# Patient Record
Sex: Female | Born: 1935 | Race: White | Hispanic: No | Smoking: Never smoker
Health system: Southern US, Community
[De-identification: ages and names within clinical notes are randomized; demographics above are authoritative.]

## PROBLEM LIST (undated history)

## (undated) DIAGNOSIS — F419 Anxiety disorder, unspecified: Secondary | ICD-10-CM

## (undated) DIAGNOSIS — E079 Disorder of thyroid, unspecified: Secondary | ICD-10-CM

## (undated) DIAGNOSIS — I6529 Occlusion and stenosis of unspecified carotid artery: Secondary | ICD-10-CM

## (undated) DIAGNOSIS — M545 Low back pain, unspecified: Secondary | ICD-10-CM

## (undated) DIAGNOSIS — I491 Atrial premature depolarization: Secondary | ICD-10-CM

## (undated) DIAGNOSIS — F329 Major depressive disorder, single episode, unspecified: Secondary | ICD-10-CM

## (undated) DIAGNOSIS — I10 Essential (primary) hypertension: Secondary | ICD-10-CM

## (undated) DIAGNOSIS — K219 Gastro-esophageal reflux disease without esophagitis: Secondary | ICD-10-CM

## (undated) DIAGNOSIS — R350 Frequency of micturition: Secondary | ICD-10-CM

## (undated) DIAGNOSIS — M810 Age-related osteoporosis without current pathological fracture: Secondary | ICD-10-CM

## (undated) DIAGNOSIS — E785 Hyperlipidemia, unspecified: Secondary | ICD-10-CM

## (undated) DIAGNOSIS — R001 Bradycardia, unspecified: Secondary | ICD-10-CM

## (undated) DIAGNOSIS — F32A Depression, unspecified: Secondary | ICD-10-CM

## (undated) HISTORY — DX: Age-related osteoporosis without current pathological fracture: M81.0

## (undated) HISTORY — DX: Bradycardia, unspecified: R00.1

## (undated) HISTORY — DX: Gastro-esophageal reflux disease without esophagitis: K21.9

## (undated) HISTORY — DX: Major depressive disorder, single episode, unspecified: F32.9

## (undated) HISTORY — DX: Essential (primary) hypertension: I10

## (undated) HISTORY — PX: OTHER SURGICAL HISTORY: SHX169

## (undated) HISTORY — DX: Hyperlipidemia, unspecified: E78.5

## (undated) HISTORY — DX: Low back pain: M54.5

## (undated) HISTORY — DX: Depression, unspecified: F32.A

## (undated) HISTORY — DX: Anxiety disorder, unspecified: F41.9

## (undated) HISTORY — DX: Occlusion and stenosis of unspecified carotid artery: I65.29

## (undated) HISTORY — DX: Low back pain, unspecified: M54.50

## (undated) HISTORY — DX: Frequency of micturition: R35.0

## (undated) HISTORY — PX: WRIST SURGERY: SHX841

## (undated) HISTORY — DX: Disorder of thyroid, unspecified: E07.9

## (undated) HISTORY — DX: Atrial premature depolarization: I49.1

## (undated) HISTORY — PX: CARPAL TUNNEL RELEASE: SHX101

---

## 2006-01-19 ENCOUNTER — Ambulatory Visit: Payer: Self-pay | Admitting: Internal Medicine

## 2006-12-12 LAB — HM PAP SMEAR

## 2008-02-25 ENCOUNTER — Ambulatory Visit: Payer: Self-pay | Admitting: Family Medicine

## 2008-09-15 LAB — HM MAMMOGRAPHY

## 2008-10-30 ENCOUNTER — Ambulatory Visit: Payer: Self-pay | Admitting: Internal Medicine

## 2010-01-27 ENCOUNTER — Ambulatory Visit: Payer: Self-pay | Admitting: Vascular Surgery

## 2010-02-02 ENCOUNTER — Ambulatory Visit: Payer: Self-pay | Admitting: Vascular Surgery

## 2010-03-02 ENCOUNTER — Inpatient Hospital Stay: Payer: Self-pay | Admitting: Vascular Surgery

## 2014-02-03 DIAGNOSIS — I38 Endocarditis, valve unspecified: Secondary | ICD-10-CM | POA: Insufficient documentation

## 2014-02-03 DIAGNOSIS — I6529 Occlusion and stenosis of unspecified carotid artery: Secondary | ICD-10-CM | POA: Insufficient documentation

## 2014-05-20 ENCOUNTER — Ambulatory Visit: Payer: Self-pay | Admitting: Podiatry

## 2015-06-15 ENCOUNTER — Ambulatory Visit: Payer: PPO

## 2015-06-15 DIAGNOSIS — Z23 Encounter for immunization: Secondary | ICD-10-CM

## 2015-07-02 ENCOUNTER — Other Ambulatory Visit: Payer: Self-pay | Admitting: Unknown Physician Specialty

## 2015-07-05 ENCOUNTER — Other Ambulatory Visit: Payer: Self-pay

## 2015-07-05 MED ORDER — LOSARTAN POTASSIUM 100 MG PO TABS: 100.0000 mg | ORAL_TABLET | Freq: Every day | ORAL | Status: DC

## 2015-07-05 NOTE — Telephone Encounter (Signed)
Patient was last seen 12/30/14 and has appointment scheduled for 07/14/15. Practice partner number is [phone] and pharmacy is [facility] on [address].

## 2015-07-12 DIAGNOSIS — E039 Hypothyroidism, unspecified: Secondary | ICD-10-CM

## 2015-07-12 DIAGNOSIS — F329 Major depressive disorder, single episode, unspecified: Secondary | ICD-10-CM

## 2015-07-12 DIAGNOSIS — R001 Bradycardia, unspecified: Secondary | ICD-10-CM

## 2015-07-12 DIAGNOSIS — I6529 Occlusion and stenosis of unspecified carotid artery: Secondary | ICD-10-CM

## 2015-07-12 DIAGNOSIS — K219 Gastro-esophageal reflux disease without esophagitis: Secondary | ICD-10-CM

## 2015-07-12 DIAGNOSIS — F32A Depression, unspecified: Secondary | ICD-10-CM

## 2015-07-12 DIAGNOSIS — E785 Hyperlipidemia, unspecified: Secondary | ICD-10-CM | POA: Insufficient documentation

## 2015-07-12 DIAGNOSIS — M810 Age-related osteoporosis without current pathological fracture: Secondary | ICD-10-CM

## 2015-07-12 DIAGNOSIS — F325 Major depressive disorder, single episode, in full remission: Secondary | ICD-10-CM | POA: Insufficient documentation

## 2015-07-12 DIAGNOSIS — F339 Major depressive disorder, recurrent, unspecified: Secondary | ICD-10-CM | POA: Insufficient documentation

## 2015-07-12 DIAGNOSIS — F419 Anxiety disorder, unspecified: Secondary | ICD-10-CM

## 2015-07-12 DIAGNOSIS — R350 Frequency of micturition: Secondary | ICD-10-CM

## 2015-07-12 DIAGNOSIS — L659 Nonscarring hair loss, unspecified: Secondary | ICD-10-CM

## 2015-07-12 DIAGNOSIS — I491 Atrial premature depolarization: Secondary | ICD-10-CM

## 2015-07-12 DIAGNOSIS — I10 Essential (primary) hypertension: Secondary | ICD-10-CM | POA: Insufficient documentation

## 2015-07-14 ENCOUNTER — Ambulatory Visit (INDEPENDENT_AMBULATORY_CARE_PROVIDER_SITE_OTHER): Payer: PPO | Admitting: Unknown Physician Specialty

## 2015-07-14 ENCOUNTER — Encounter: Payer: Self-pay | Admitting: Unknown Physician Specialty

## 2015-07-14 VITALS — BP 149/84 | HR 90 | Temp 97.8°F | Ht 66.6 in | Wt 203.2 lb

## 2015-07-14 DIAGNOSIS — R350 Frequency of micturition: Secondary | ICD-10-CM | POA: Diagnosis not present

## 2015-07-14 DIAGNOSIS — E039 Hypothyroidism, unspecified: Secondary | ICD-10-CM | POA: Diagnosis not present

## 2015-07-14 DIAGNOSIS — I38 Endocarditis, valve unspecified: Secondary | ICD-10-CM

## 2015-07-14 DIAGNOSIS — I10 Essential (primary) hypertension: Secondary | ICD-10-CM

## 2015-07-14 DIAGNOSIS — Z0000 Encounter for general adult medical examination without abnormal findings: Secondary | ICD-10-CM

## 2015-07-14 LAB — MICROALBUMIN, URINE WAIVED
CREATININE, URINE WAIVED: 10 mg/dL (ref 10–300)
MICROALB, UR WAIVED: 10 mg/L (ref 0–19)

## 2015-07-14 LAB — MICROSCOPIC EXAMINATION

## 2015-07-14 NOTE — Assessment & Plan Note (Signed)
Stable, continue present medications.   

## 2015-07-14 NOTE — Progress Notes (Signed)
BP 149/84 mmHg  Pulse 90  Temp(Src) 97.8 F (36.6 C)  Ht 5' 6.6" (1.692 m)  Wt 203 lb 3.2 oz (92.171 kg)  BMI 32.20 kg/m2  SpO2 95%  LMP  (LMP Unknown)   Subjective:    Patient ID: Mckenzie Gutierrez, female    DOB: April 01, 1922, 81 y.o.   MRN: [id number]  HPI: Mckenzie Gutierrez is a 81 y.o. female  Chief Complaint  Patient presents with  . Medicare Wellness   Hypertension Using medications without difficulty Average home BPs   No problems or lightheadedness No chest pain with exertion or shortness of breath No Edema  Hypothyroid Doing well with improved energy since she started taking Biotin.   Depression Doing well Depression screen PHQ 2/9 07/14/2015  Decreased Interest 0  Down, Depressed, Hopeless 0  PHQ - 2 Score 0   Functional Status Survey: Is the patient deaf or have difficulty hearing?: Yes (pt has hearing aids) Does the patient have difficulty seeing, even when wearing glasses/contacts?: No Does the patient have difficulty concentrating, remembering, or making decisions?: Yes Does the patient have difficulty walking or climbing stairs?: Yes (pt states legs get weak sometimes) Does the patient have difficulty dressing or bathing?: No Does the patient have difficulty doing errands alone such as visiting a doctor's office or shopping?: No  Fall Risk  07/14/2015  Falls in the past year? No   Relevant past medical, surgical, family and social history reviewed and updated as indicated. Interim medical history since our last visit reviewed. Allergies and medications reviewed and updated.  See functional status, depression screen, and fall's risk assessment  under the appropriate section.    Pt is able to perform complex mental tasks, recognize clock face, recognize time and do a 3 item recall.     Review of Systems  Constitutional: Negative.   HENT: Negative.   Eyes: Negative.   Respiratory: Negative.   Cardiovascular: Negative.   Gastrointestinal:  Negative.   Endocrine: Negative.   Genitourinary:       Occasional urgency  Musculoskeletal: Negative.   Skin: Negative.   Allergic/Immunologic: Negative.   Neurological: Negative.   Hematological: Negative.   Psychiatric/Behavioral: Negative.     Per HPI unless specifically indicated above     Objective:    BP 149/84 mmHg  Pulse 90  Temp(Src) 97.8 F (36.6 C)  Ht 5' 6.6" (1.692 m)  Wt 203 lb 3.2 oz (92.171 kg)  BMI 32.20 kg/m2  SpO2 95%  LMP  (LMP Unknown)  Wt Readings from Last 3 Encounters:  07/14/15 203 lb 3.2 oz (92.171 kg)  12/30/14 200 lb (90.719 kg)    Physical Exam  Constitutional: She is oriented to person, place, and time. She appears well-developed and well-nourished. No distress.  HENT:  Head: Normocephalic and atraumatic.  Eyes: Conjunctivae and lids are normal. Right eye exhibits no discharge. Left eye exhibits no discharge. No scleral icterus.  Cardiovascular: Normal rate and regular rhythm.   Murmur heard. Pulmonary/Chest: Effort normal and breath sounds normal. No respiratory distress.  Abdominal: Normal appearance. There is no splenomegaly or hepatomegaly.  Musculoskeletal: Normal range of motion.  Neurological: She is alert and oriented to person, place, and time.  Skin: Skin is intact. No rash noted. No pallor.  Psychiatric: She has a normal mood and affect. Her behavior is normal. Judgment and thought content normal.       Assessment & Plan:   Problem List Items Addressed This Visit  Unprioritized   Heart valve disease    Discussed referring back to Dr. Nehemiah Massed  Pt refusing as she would not want surgery      Hypothyroid    Check TSH      Relevant Orders   TSH   Urinary frequency   Relevant Orders   UA/M w/rflx Culture, Routine   Hypertension - Primary    Stable, continue present medications.       Relevant Orders   Microalbumin, Urine Waived   Comprehensive metabolic panel   Lipid Panel w/o Chol/HDL Ratio    Other  Visit Diagnoses    Annual physical exam            Follow up plan: Return in about 6 months (around 01/11/2016).

## 2015-07-14 NOTE — Assessment & Plan Note (Signed)
Check TSH 

## 2015-07-14 NOTE — Assessment & Plan Note (Addendum)
Discussed referring back to Dr. Nehemiah Massed  Pt refusing as she would not want surgery

## 2015-07-15 ENCOUNTER — Encounter: Payer: Self-pay | Admitting: Unknown Physician Specialty

## 2015-07-15 LAB — COMPREHENSIVE METABOLIC PANEL
A/G RATIO: 2 (ref 1.1–2.5)
ALBUMIN: 4.7 g/dL — AB (ref 3.2–4.6)
ALK PHOS: 67 IU/L (ref 39–117)
ALT: 14 IU/L (ref 0–32)
AST: 25 IU/L (ref 0–40)
BILIRUBIN TOTAL: 1 mg/dL (ref 0.0–1.2)
BUN / CREAT RATIO: 13 (ref 11–26)
BUN: 11 mg/dL (ref 10–36)
CHLORIDE: 101 mmol/L (ref 97–106)
CO2: 25 mmol/L (ref 18–29)
CREATININE: 0.86 mg/dL (ref 0.57–1.00)
Calcium: 9.6 mg/dL (ref 8.7–10.3)
GFR calc Af Amer: 68 mL/min/{1.73_m2} (ref >59)
GFR calc non Af Amer: 59 mL/min/{1.73_m2} — ABNORMAL LOW (ref >59)
Globulin, Total: 2.3 g/dL (ref 1.5–4.5)
Glucose: 109 mg/dL — ABNORMAL HIGH (ref 65–99)
Potassium: 3.9 mmol/L (ref 3.5–5.2)
Sodium: 142 mmol/L (ref 136–144)
Total Protein: 7 g/dL (ref 6.0–8.5)

## 2015-07-15 LAB — LIPID PANEL W/O CHOL/HDL RATIO
CHOLESTEROL TOTAL: 196 mg/dL (ref 100–199)
HDL: 49 mg/dL (ref >39)
LDL Calculated: 119 mg/dL — ABNORMAL HIGH (ref 0–99)
TRIGLYCERIDES: 141 mg/dL (ref 0–149)
VLDL Cholesterol Cal: 28 mg/dL (ref 5–40)

## 2015-07-15 LAB — TSH: TSH: 3.32 u[IU]/mL (ref 0.450–4.500)

## 2015-07-16 ENCOUNTER — Other Ambulatory Visit: Payer: Self-pay | Admitting: Unknown Physician Specialty

## 2015-07-16 LAB — URINE CULTURE, REFLEX

## 2015-07-16 MED ORDER — CIPROFLOXACIN HCL 250 MG PO TABS: 250.0000 mg | ORAL_TABLET | Freq: Two times a day (BID) | ORAL | Status: DC

## 2015-07-16 NOTE — Progress Notes (Signed)
Called and let patient know about results and medication.

## 2015-07-18 LAB — UA/M W/RFLX CULTURE, ROUTINE

## 2015-07-19 ENCOUNTER — Other Ambulatory Visit: Payer: Self-pay | Admitting: Unknown Physician Specialty

## 2015-07-19 MED ORDER — DOXYCYCLINE HYCLATE 100 MG PO TABS: 100.0000 mg | ORAL_TABLET | Freq: Two times a day (BID) | ORAL | Status: DC

## 2015-08-17 ENCOUNTER — Ambulatory Visit (INDEPENDENT_AMBULATORY_CARE_PROVIDER_SITE_OTHER): Payer: PPO | Admitting: Unknown Physician Specialty

## 2015-08-17 ENCOUNTER — Encounter: Payer: Self-pay | Admitting: Unknown Physician Specialty

## 2015-08-17 VITALS — BP 150/85 | HR 69 | Temp 97.9°F | Wt 204.4 lb

## 2015-08-17 DIAGNOSIS — B356 Tinea cruris: Secondary | ICD-10-CM

## 2015-08-17 MED ORDER — NYSTATIN 100000 UNIT/GM EX OINT: 1.0000 "application " | TOPICAL_OINTMENT | Freq: Two times a day (BID) | CUTANEOUS | Status: DC

## 2015-08-17 MED ORDER — FLUCONAZOLE 100 MG PO TABS: 100.0000 mg | ORAL_TABLET | Freq: Every day | ORAL | Status: DC

## 2015-08-17 NOTE — Progress Notes (Signed)
BP 150/85 mmHg  Pulse 69  Temp(Src) 97.9 F (36.6 C)  Wt 204 lb 6.4 oz (92.715 kg)  SpO2 95%  LMP  (LMP Unknown)   Subjective:    Patient ID: Mckenzie Gutierrez, female    DOB: [birth year], 81 y.o.   MRN: [id number]  HPI: Mckenzie Gutierrez is a 81 y.o. female  Chief Complaint  Patient presents with  . Rash    pt states she has rash on the top of her thigh that came up 2 to 3 weeks ago   Rash itches and is painful.  "It won't get well" and tried to pad it with toilet tissue which made it worse.  Relevant past medical, surgical, family and social history reviewed and updated as indicated. Interim medical history since our last visit reviewed. Allergies and medications reviewed and updated.  Review of Systems  Per HPI unless specifically indicated above     Objective:    BP 150/85 mmHg  Pulse 69  Temp(Src) 97.9 F (36.6 C)  Wt 204 lb 6.4 oz (92.715 kg)  SpO2 95%  LMP  (LMP Unknown)  Wt Readings from Last 3 Encounters:  08/17/15 204 lb 6.4 oz (92.715 kg)  07/14/15 203 lb 3.2 oz (92.171 kg)  12/30/14 200 lb (90.719 kg)    Physical Exam  Constitutional: She is oriented to person, place, and time. She appears well-developed and well-nourished. No distress.  HENT:  Head: Normocephalic and atraumatic.  Eyes: Conjunctivae and lids are normal. Right eye exhibits no discharge. Left eye exhibits no discharge. No scleral icterus.  Cardiovascular: Normal rate.   Pulmonary/Chest: Effort normal.  Abdominal: Normal appearance. There is no splenomegaly or hepatomegaly.  Musculoskeletal: Normal range of motion.  Neurological: She is alert and oriented to person, place, and time.  Skin: Skin is intact. No rash noted. No pallor.  Red and macerated right groin area.    Psychiatric: She has a normal mood and affect. Her behavior is normal. Judgment and thought content normal.    Results for orders placed or performed in visit on 07/14/15  Microscopic Examination  Result Value Ref  Range   WBC, UA 6-10 (A) 0 -  5 /hpf   RBC, UA 3-10 (A) 0 -  2 /hpf   Epithelial Cells (non renal) 0-10 0 - 10 /hpf   Bacteria, UA Moderate (A) None seen/Few  Microalbumin, Urine Waived  Result Value Ref Range   Microalb, Ur Waived 10 0 - 19 mg/L   Creatinine, Urine Waived 10 10 - 300 mg/dL   Microalb/Creat Ratio <30 <30 mg/g  Comprehensive metabolic panel  Result Value Ref Range   Glucose 109 (H) 65 - 99 mg/dL   BUN 11 10 - 36 mg/dL   Creatinine, Ser 0.86 0.57 - 1.00 mg/dL   GFR calc non Af Amer 59 (L) >59 mL/min/1.73   GFR calc Af Amer 68 >59 mL/min/1.73   BUN/Creatinine Ratio 13 11 - 26   Sodium 142 136 - 144 mmol/L   Potassium 3.9 3.5 - 5.2 mmol/L   Chloride 101 97 - 106 mmol/L   CO2 25 18 - 29 mmol/L   Calcium 9.6 8.7 - 10.3 mg/dL   Total Protein 7.0 6.0 - 8.5 g/dL   Albumin 4.7 (H) 3.2 - 4.6 g/dL   Globulin, Total 2.3 1.5 - 4.5 g/dL   Albumin/Globulin Ratio 2.0 1.1 - 2.5   Bilirubin Total 1.0 0.0 - 1.2 mg/dL   Alkaline Phosphatase 67 39 - 117 IU/L  AST 25 0 - 40 IU/L   ALT 14 0 - 32 IU/L  TSH  Result Value Ref Range   TSH 3.320 0.450 - 4.500 uIU/mL  Lipid Panel w/o Chol/HDL Ratio  Result Value Ref Range   Cholesterol, Total 196 100 - 199 mg/dL   Triglycerides 141 0 - 149 mg/dL   HDL 49 >39 mg/dL   VLDL Cholesterol Cal 28 5 - 40 mg/dL   LDL Calculated 119 (H) 0 - 99 mg/dL  UA/M w/rflx Culture, Routine  Result Value Ref Range   Urine Culture, Routine Final report (A)    Urine Culture result 1 Comment (A)    ANTIMICROBIAL SUSCEPTIBILITY Comment   Urine Culture, Routine  Result Value Ref Range   Urine Culture result 1 Gram negative rods (A)       Assessment & Plan:   Problem List Items Addressed This Visit    None    Visit Diagnoses    Tinea cruris    -  Primary    Severe case.  Will rx with both diflucan and cream    Relevant Medications    fluconazole (DIFLUCAN) 100 MG tablet    nystatin ointment (MYCOSTATIN)        Follow up plan: Return if  symptoms worsen or fail to improve.

## 2015-09-06 DIAGNOSIS — M9905 Segmental and somatic dysfunction of pelvic region: Secondary | ICD-10-CM | POA: Diagnosis not present

## 2015-09-06 DIAGNOSIS — M5431 Sciatica, right side: Secondary | ICD-10-CM | POA: Diagnosis not present

## 2015-09-06 DIAGNOSIS — M9903 Segmental and somatic dysfunction of lumbar region: Secondary | ICD-10-CM | POA: Diagnosis not present

## 2015-09-06 DIAGNOSIS — M5136 Other intervertebral disc degeneration, lumbar region: Secondary | ICD-10-CM | POA: Diagnosis not present

## 2015-09-08 DIAGNOSIS — M5136 Other intervertebral disc degeneration, lumbar region: Secondary | ICD-10-CM | POA: Diagnosis not present

## 2015-09-08 DIAGNOSIS — M9905 Segmental and somatic dysfunction of pelvic region: Secondary | ICD-10-CM | POA: Diagnosis not present

## 2015-09-08 DIAGNOSIS — M5431 Sciatica, right side: Secondary | ICD-10-CM | POA: Diagnosis not present

## 2015-09-08 DIAGNOSIS — M9903 Segmental and somatic dysfunction of lumbar region: Secondary | ICD-10-CM | POA: Diagnosis not present

## 2015-09-10 DIAGNOSIS — M9905 Segmental and somatic dysfunction of pelvic region: Secondary | ICD-10-CM | POA: Diagnosis not present

## 2015-09-10 DIAGNOSIS — M9903 Segmental and somatic dysfunction of lumbar region: Secondary | ICD-10-CM | POA: Diagnosis not present

## 2015-09-10 DIAGNOSIS — M5431 Sciatica, right side: Secondary | ICD-10-CM | POA: Diagnosis not present

## 2015-09-10 DIAGNOSIS — M5136 Other intervertebral disc degeneration, lumbar region: Secondary | ICD-10-CM | POA: Diagnosis not present

## 2015-09-15 DIAGNOSIS — M5431 Sciatica, right side: Secondary | ICD-10-CM | POA: Diagnosis not present

## 2015-09-15 DIAGNOSIS — M9903 Segmental and somatic dysfunction of lumbar region: Secondary | ICD-10-CM | POA: Diagnosis not present

## 2015-09-15 DIAGNOSIS — M9905 Segmental and somatic dysfunction of pelvic region: Secondary | ICD-10-CM | POA: Diagnosis not present

## 2015-09-15 DIAGNOSIS — M5136 Other intervertebral disc degeneration, lumbar region: Secondary | ICD-10-CM | POA: Diagnosis not present

## 2015-09-16 DIAGNOSIS — M5431 Sciatica, right side: Secondary | ICD-10-CM | POA: Diagnosis not present

## 2015-09-16 DIAGNOSIS — M9903 Segmental and somatic dysfunction of lumbar region: Secondary | ICD-10-CM | POA: Diagnosis not present

## 2015-09-16 DIAGNOSIS — M9905 Segmental and somatic dysfunction of pelvic region: Secondary | ICD-10-CM | POA: Diagnosis not present

## 2015-09-16 DIAGNOSIS — M5136 Other intervertebral disc degeneration, lumbar region: Secondary | ICD-10-CM | POA: Diagnosis not present

## 2015-09-17 DIAGNOSIS — M9905 Segmental and somatic dysfunction of pelvic region: Secondary | ICD-10-CM | POA: Diagnosis not present

## 2015-09-17 DIAGNOSIS — M5431 Sciatica, right side: Secondary | ICD-10-CM | POA: Diagnosis not present

## 2015-09-17 DIAGNOSIS — M9903 Segmental and somatic dysfunction of lumbar region: Secondary | ICD-10-CM | POA: Diagnosis not present

## 2015-09-17 DIAGNOSIS — M5136 Other intervertebral disc degeneration, lumbar region: Secondary | ICD-10-CM | POA: Diagnosis not present

## 2015-09-20 DIAGNOSIS — M9905 Segmental and somatic dysfunction of pelvic region: Secondary | ICD-10-CM | POA: Diagnosis not present

## 2015-09-20 DIAGNOSIS — M5431 Sciatica, right side: Secondary | ICD-10-CM | POA: Diagnosis not present

## 2015-09-20 DIAGNOSIS — M5136 Other intervertebral disc degeneration, lumbar region: Secondary | ICD-10-CM | POA: Diagnosis not present

## 2015-09-20 DIAGNOSIS — M9903 Segmental and somatic dysfunction of lumbar region: Secondary | ICD-10-CM | POA: Diagnosis not present

## 2015-09-22 DIAGNOSIS — M5136 Other intervertebral disc degeneration, lumbar region: Secondary | ICD-10-CM | POA: Diagnosis not present

## 2015-09-22 DIAGNOSIS — M9903 Segmental and somatic dysfunction of lumbar region: Secondary | ICD-10-CM | POA: Diagnosis not present

## 2015-09-22 DIAGNOSIS — M5431 Sciatica, right side: Secondary | ICD-10-CM | POA: Diagnosis not present

## 2015-09-22 DIAGNOSIS — M9905 Segmental and somatic dysfunction of pelvic region: Secondary | ICD-10-CM | POA: Diagnosis not present

## 2015-09-24 DIAGNOSIS — M9905 Segmental and somatic dysfunction of pelvic region: Secondary | ICD-10-CM | POA: Diagnosis not present

## 2015-09-24 DIAGNOSIS — M5136 Other intervertebral disc degeneration, lumbar region: Secondary | ICD-10-CM | POA: Diagnosis not present

## 2015-09-24 DIAGNOSIS — M5431 Sciatica, right side: Secondary | ICD-10-CM | POA: Diagnosis not present

## 2015-09-24 DIAGNOSIS — M9903 Segmental and somatic dysfunction of lumbar region: Secondary | ICD-10-CM | POA: Diagnosis not present

## 2015-09-27 DIAGNOSIS — M9903 Segmental and somatic dysfunction of lumbar region: Secondary | ICD-10-CM | POA: Diagnosis not present

## 2015-09-27 DIAGNOSIS — M5431 Sciatica, right side: Secondary | ICD-10-CM | POA: Diagnosis not present

## 2015-09-27 DIAGNOSIS — M5136 Other intervertebral disc degeneration, lumbar region: Secondary | ICD-10-CM | POA: Diagnosis not present

## 2015-09-27 DIAGNOSIS — M9905 Segmental and somatic dysfunction of pelvic region: Secondary | ICD-10-CM | POA: Diagnosis not present

## 2015-09-29 DIAGNOSIS — M9903 Segmental and somatic dysfunction of lumbar region: Secondary | ICD-10-CM | POA: Diagnosis not present

## 2015-09-29 DIAGNOSIS — M5431 Sciatica, right side: Secondary | ICD-10-CM | POA: Diagnosis not present

## 2015-09-29 DIAGNOSIS — M5136 Other intervertebral disc degeneration, lumbar region: Secondary | ICD-10-CM | POA: Diagnosis not present

## 2015-09-29 DIAGNOSIS — M9905 Segmental and somatic dysfunction of pelvic region: Secondary | ICD-10-CM | POA: Diagnosis not present

## 2015-10-01 DIAGNOSIS — M5431 Sciatica, right side: Secondary | ICD-10-CM | POA: Diagnosis not present

## 2015-10-01 DIAGNOSIS — M9903 Segmental and somatic dysfunction of lumbar region: Secondary | ICD-10-CM | POA: Diagnosis not present

## 2015-10-01 DIAGNOSIS — M5136 Other intervertebral disc degeneration, lumbar region: Secondary | ICD-10-CM | POA: Diagnosis not present

## 2015-10-01 DIAGNOSIS — M9905 Segmental and somatic dysfunction of pelvic region: Secondary | ICD-10-CM | POA: Diagnosis not present

## 2015-10-02 ENCOUNTER — Other Ambulatory Visit: Payer: Self-pay | Admitting: Unknown Physician Specialty

## 2015-10-05 DIAGNOSIS — M5431 Sciatica, right side: Secondary | ICD-10-CM | POA: Diagnosis not present

## 2015-10-05 DIAGNOSIS — M5136 Other intervertebral disc degeneration, lumbar region: Secondary | ICD-10-CM | POA: Diagnosis not present

## 2015-10-05 DIAGNOSIS — M9905 Segmental and somatic dysfunction of pelvic region: Secondary | ICD-10-CM | POA: Diagnosis not present

## 2015-10-05 DIAGNOSIS — M9903 Segmental and somatic dysfunction of lumbar region: Secondary | ICD-10-CM | POA: Diagnosis not present

## 2015-10-08 DIAGNOSIS — M9903 Segmental and somatic dysfunction of lumbar region: Secondary | ICD-10-CM | POA: Diagnosis not present

## 2015-10-08 DIAGNOSIS — M9905 Segmental and somatic dysfunction of pelvic region: Secondary | ICD-10-CM | POA: Diagnosis not present

## 2015-10-08 DIAGNOSIS — M5431 Sciatica, right side: Secondary | ICD-10-CM | POA: Diagnosis not present

## 2015-10-08 DIAGNOSIS — M5136 Other intervertebral disc degeneration, lumbar region: Secondary | ICD-10-CM | POA: Diagnosis not present

## 2015-10-11 ENCOUNTER — Other Ambulatory Visit: Payer: Self-pay | Admitting: Unknown Physician Specialty

## 2015-10-12 DIAGNOSIS — M5431 Sciatica, right side: Secondary | ICD-10-CM | POA: Diagnosis not present

## 2015-10-12 DIAGNOSIS — M9905 Segmental and somatic dysfunction of pelvic region: Secondary | ICD-10-CM | POA: Diagnosis not present

## 2015-10-12 DIAGNOSIS — M9903 Segmental and somatic dysfunction of lumbar region: Secondary | ICD-10-CM | POA: Diagnosis not present

## 2015-10-12 DIAGNOSIS — M5136 Other intervertebral disc degeneration, lumbar region: Secondary | ICD-10-CM | POA: Diagnosis not present

## 2015-10-15 DIAGNOSIS — M5431 Sciatica, right side: Secondary | ICD-10-CM | POA: Diagnosis not present

## 2015-10-15 DIAGNOSIS — M9903 Segmental and somatic dysfunction of lumbar region: Secondary | ICD-10-CM | POA: Diagnosis not present

## 2015-10-15 DIAGNOSIS — M9905 Segmental and somatic dysfunction of pelvic region: Secondary | ICD-10-CM | POA: Diagnosis not present

## 2015-10-15 DIAGNOSIS — M5136 Other intervertebral disc degeneration, lumbar region: Secondary | ICD-10-CM | POA: Diagnosis not present

## 2015-10-19 DIAGNOSIS — M5431 Sciatica, right side: Secondary | ICD-10-CM | POA: Diagnosis not present

## 2015-10-19 DIAGNOSIS — M5136 Other intervertebral disc degeneration, lumbar region: Secondary | ICD-10-CM | POA: Diagnosis not present

## 2015-10-19 DIAGNOSIS — M9905 Segmental and somatic dysfunction of pelvic region: Secondary | ICD-10-CM | POA: Diagnosis not present

## 2015-10-19 DIAGNOSIS — M9903 Segmental and somatic dysfunction of lumbar region: Secondary | ICD-10-CM | POA: Diagnosis not present

## 2015-10-22 DIAGNOSIS — M9905 Segmental and somatic dysfunction of pelvic region: Secondary | ICD-10-CM | POA: Diagnosis not present

## 2015-10-22 DIAGNOSIS — M5136 Other intervertebral disc degeneration, lumbar region: Secondary | ICD-10-CM | POA: Diagnosis not present

## 2015-10-22 DIAGNOSIS — M5431 Sciatica, right side: Secondary | ICD-10-CM | POA: Diagnosis not present

## 2015-10-22 DIAGNOSIS — M9903 Segmental and somatic dysfunction of lumbar region: Secondary | ICD-10-CM | POA: Diagnosis not present

## 2015-10-27 ENCOUNTER — Ambulatory Visit (INDEPENDENT_AMBULATORY_CARE_PROVIDER_SITE_OTHER): Payer: PPO | Admitting: Family Medicine

## 2015-10-27 ENCOUNTER — Encounter: Payer: Self-pay | Admitting: Family Medicine

## 2015-10-27 VITALS — BP 146/80 | HR 87 | Temp 97.5°F | Wt 202.0 lb

## 2015-10-27 DIAGNOSIS — I10 Essential (primary) hypertension: Secondary | ICD-10-CM

## 2015-10-27 DIAGNOSIS — J019 Acute sinusitis, unspecified: Secondary | ICD-10-CM

## 2015-10-27 MED ORDER — AMOXICILLIN 875 MG PO TABS: 875.0000 mg | ORAL_TABLET | Freq: Two times a day (BID) | ORAL | Status: DC

## 2015-10-27 NOTE — Progress Notes (Signed)
BP 146/80 mmHg  Pulse 87  Temp(Src) 97.5 F (36.4 C)  Wt 202 lb (91.627 kg)  SpO2 97%  LMP  (LMP Unknown)   Subjective:    Patient ID: Mckenzie Gutierrez, female    DOB: [birth year], 82 y.o.   MRN: [id number]  HPI: Mckenzie Gutierrez is a 82 y.o. female  Chief Complaint  Patient presents with  . URI    X4 days   Sinus pressure congestion getting worse sloshing sensation frontal sinus area maxillary sinus area with marked facial pressure is been using over-the-counter cough cold medications with only minimal help also running some intermittent fever and been very achy Relevant past medical, surgical, family and social history reviewed and updated as indicated. Interim medical history since our last visit reviewed. Allergies and medications reviewed and updated.  Review of Systems  Constitutional: Positive for fever and fatigue.  HENT: Positive for congestion, postnasal drip, rhinorrhea, sinus pressure, sneezing and sore throat.   Respiratory: Positive for cough.   Cardiovascular: Negative.   Gastrointestinal: Negative.     Per HPI unless specifically indicated above     Objective:    BP 146/80 mmHg  Pulse 87  Temp(Src) 97.5 F (36.4 C)  Wt 202 lb (91.627 kg)  SpO2 97%  LMP  (LMP Unknown)  Wt Readings from Last 3 Encounters:  10/27/15 202 lb (91.627 kg)  08/17/15 204 lb 6.4 oz (92.715 kg)  07/14/15 203 lb 3.2 oz (92.171 kg)    Physical Exam  Constitutional: She is oriented to person, place, and time. She appears well-developed and well-nourished. No distress.  HENT:  Head: Normocephalic and atraumatic.  Right Ear: Hearing and external ear normal.  Left Ear: Hearing and external ear normal.  Nose: Nose normal.  Mouth/Throat: Oropharyngeal exudate present.  Eyes: Conjunctivae and lids are normal. Right eye exhibits no discharge. Left eye exhibits no discharge. No scleral icterus.  Cardiovascular: Normal rate and normal heart sounds.   Pulmonary/Chest: Effort  normal and breath sounds normal. No respiratory distress.  Musculoskeletal: Normal range of motion.  Neurological: She is alert and oriented to person, place, and time.  Skin: Skin is intact. No rash noted.  Psychiatric: She has a normal mood and affect. Her speech is normal and behavior is normal. Judgment and thought content normal. Cognition and memory are normal.    Results for orders placed or performed in visit on 07/14/15  Microscopic Examination  Result Value Ref Range   WBC, UA 6-10 (A) 0 -  5 /hpf   RBC, UA 3-10 (A) 0 -  2 /hpf   Epithelial Cells (non renal) 0-10 0 - 10 /hpf   Bacteria, UA Moderate (A) None seen/Few  Microalbumin, Urine Waived  Result Value Ref Range   Microalb, Ur Waived 10 0 - 19 mg/L   Creatinine, Urine Waived 10 10 - 300 mg/dL   Microalb/Creat Ratio <30 <30 mg/g  Comprehensive metabolic panel  Result Value Ref Range   Glucose 109 (H) 65 - 99 mg/dL   BUN 11 10 - 36 mg/dL   Creatinine, Ser 0.86 0.57 - 1.00 mg/dL   GFR calc non Af Amer 59 (L) >59 mL/min/1.73   GFR calc Af Amer 68 >59 mL/min/1.73   BUN/Creatinine Ratio 13 11 - 26   Sodium 142 136 - 144 mmol/L   Potassium 3.9 3.5 - 5.2 mmol/L   Chloride 101 97 - 106 mmol/L   CO2 25 18 - 29 mmol/L   Calcium 9.6 8.7 -  10.3 mg/dL   Total Protein 7.0 6.0 - 8.5 g/dL   Albumin 4.7 (H) 3.2 - 4.6 g/dL   Globulin, Total 2.3 1.5 - 4.5 g/dL   Albumin/Globulin Ratio 2.0 1.1 - 2.5   Bilirubin Total 1.0 0.0 - 1.2 mg/dL   Alkaline Phosphatase 67 39 - 117 IU/L   AST 25 0 - 40 IU/L   ALT 14 0 - 32 IU/L  TSH  Result Value Ref Range   TSH 3.320 0.450 - 4.500 uIU/mL  Lipid Panel w/o Chol/HDL Ratio  Result Value Ref Range   Cholesterol, Total 196 100 - 199 mg/dL   Triglycerides 141 0 - 149 mg/dL   HDL 49 >39 mg/dL   VLDL Cholesterol Cal 28 5 - 40 mg/dL   LDL Calculated 119 (H) 0 - 99 mg/dL  UA/M w/rflx Culture, Routine  Result Value Ref Range   Urine Culture, Routine Final report (A)    Urine Culture result 1  Comment (A)    ANTIMICROBIAL SUSCEPTIBILITY Comment   Urine Culture, Routine  Result Value Ref Range   Urine Culture result 1 Gram negative rods (A)       Assessment & Plan:   Problem List Items Addressed This Visit      Cardiovascular and Mediastinum   Hypertension    Hypertension doing well slightly elevated today but otherwise home checks has been doing well no complaints with medications takes faithfully       Other Visit Diagnoses    Acute sinusitis, recurrence not specified, unspecified location    -  Primary    Discussed sinusitis care and treatment use of over-the-counter medications Mucinex Tylenol discuss adding antibiotics and use of antibiotics  cough care and    Relevant Medications    amoxicillin (AMOXIL) 875 MG tablet        Follow up plan: Return for As scheduled.

## 2015-10-27 NOTE — Assessment & Plan Note (Signed)
Hypertension doing well slightly elevated today but otherwise home checks has been doing well no complaints with medications takes faithfully

## 2015-10-29 DIAGNOSIS — M9905 Segmental and somatic dysfunction of pelvic region: Secondary | ICD-10-CM | POA: Diagnosis not present

## 2015-10-29 DIAGNOSIS — M9903 Segmental and somatic dysfunction of lumbar region: Secondary | ICD-10-CM | POA: Diagnosis not present

## 2015-10-29 DIAGNOSIS — M5136 Other intervertebral disc degeneration, lumbar region: Secondary | ICD-10-CM | POA: Diagnosis not present

## 2015-10-29 DIAGNOSIS — M5431 Sciatica, right side: Secondary | ICD-10-CM | POA: Diagnosis not present

## 2015-11-02 DIAGNOSIS — M9905 Segmental and somatic dysfunction of pelvic region: Secondary | ICD-10-CM | POA: Diagnosis not present

## 2015-11-02 DIAGNOSIS — M5431 Sciatica, right side: Secondary | ICD-10-CM | POA: Diagnosis not present

## 2015-11-02 DIAGNOSIS — M9903 Segmental and somatic dysfunction of lumbar region: Secondary | ICD-10-CM | POA: Diagnosis not present

## 2015-11-02 DIAGNOSIS — M5136 Other intervertebral disc degeneration, lumbar region: Secondary | ICD-10-CM | POA: Diagnosis not present

## 2015-11-05 DIAGNOSIS — M9905 Segmental and somatic dysfunction of pelvic region: Secondary | ICD-10-CM | POA: Diagnosis not present

## 2015-11-05 DIAGNOSIS — M5136 Other intervertebral disc degeneration, lumbar region: Secondary | ICD-10-CM | POA: Diagnosis not present

## 2015-11-05 DIAGNOSIS — M5431 Sciatica, right side: Secondary | ICD-10-CM | POA: Diagnosis not present

## 2015-11-05 DIAGNOSIS — M9903 Segmental and somatic dysfunction of lumbar region: Secondary | ICD-10-CM | POA: Diagnosis not present

## 2015-11-09 DIAGNOSIS — M9903 Segmental and somatic dysfunction of lumbar region: Secondary | ICD-10-CM | POA: Diagnosis not present

## 2015-11-09 DIAGNOSIS — M9905 Segmental and somatic dysfunction of pelvic region: Secondary | ICD-10-CM | POA: Diagnosis not present

## 2015-11-09 DIAGNOSIS — M5136 Other intervertebral disc degeneration, lumbar region: Secondary | ICD-10-CM | POA: Diagnosis not present

## 2015-11-09 DIAGNOSIS — M5431 Sciatica, right side: Secondary | ICD-10-CM | POA: Diagnosis not present

## 2015-11-12 DIAGNOSIS — M9905 Segmental and somatic dysfunction of pelvic region: Secondary | ICD-10-CM | POA: Diagnosis not present

## 2015-11-12 DIAGNOSIS — M5136 Other intervertebral disc degeneration, lumbar region: Secondary | ICD-10-CM | POA: Diagnosis not present

## 2015-11-12 DIAGNOSIS — M5431 Sciatica, right side: Secondary | ICD-10-CM | POA: Diagnosis not present

## 2015-11-12 DIAGNOSIS — M9903 Segmental and somatic dysfunction of lumbar region: Secondary | ICD-10-CM | POA: Diagnosis not present

## 2015-11-16 DIAGNOSIS — M5136 Other intervertebral disc degeneration, lumbar region: Secondary | ICD-10-CM | POA: Diagnosis not present

## 2015-11-16 DIAGNOSIS — M9905 Segmental and somatic dysfunction of pelvic region: Secondary | ICD-10-CM | POA: Diagnosis not present

## 2015-11-16 DIAGNOSIS — M5431 Sciatica, right side: Secondary | ICD-10-CM | POA: Diagnosis not present

## 2015-11-16 DIAGNOSIS — M9903 Segmental and somatic dysfunction of lumbar region: Secondary | ICD-10-CM | POA: Diagnosis not present

## 2015-11-18 DIAGNOSIS — M9905 Segmental and somatic dysfunction of pelvic region: Secondary | ICD-10-CM | POA: Diagnosis not present

## 2015-11-18 DIAGNOSIS — M9903 Segmental and somatic dysfunction of lumbar region: Secondary | ICD-10-CM | POA: Diagnosis not present

## 2015-11-18 DIAGNOSIS — M5136 Other intervertebral disc degeneration, lumbar region: Secondary | ICD-10-CM | POA: Diagnosis not present

## 2015-11-18 DIAGNOSIS — M5431 Sciatica, right side: Secondary | ICD-10-CM | POA: Diagnosis not present

## 2015-11-23 DIAGNOSIS — M5136 Other intervertebral disc degeneration, lumbar region: Secondary | ICD-10-CM | POA: Diagnosis not present

## 2015-11-23 DIAGNOSIS — M9905 Segmental and somatic dysfunction of pelvic region: Secondary | ICD-10-CM | POA: Diagnosis not present

## 2015-11-23 DIAGNOSIS — M9903 Segmental and somatic dysfunction of lumbar region: Secondary | ICD-10-CM | POA: Diagnosis not present

## 2015-11-23 DIAGNOSIS — M5431 Sciatica, right side: Secondary | ICD-10-CM | POA: Diagnosis not present

## 2015-11-25 DIAGNOSIS — M5136 Other intervertebral disc degeneration, lumbar region: Secondary | ICD-10-CM | POA: Diagnosis not present

## 2015-11-25 DIAGNOSIS — M9905 Segmental and somatic dysfunction of pelvic region: Secondary | ICD-10-CM | POA: Diagnosis not present

## 2015-11-25 DIAGNOSIS — M5431 Sciatica, right side: Secondary | ICD-10-CM | POA: Diagnosis not present

## 2015-11-25 DIAGNOSIS — M9903 Segmental and somatic dysfunction of lumbar region: Secondary | ICD-10-CM | POA: Diagnosis not present

## 2015-11-30 DIAGNOSIS — M5136 Other intervertebral disc degeneration, lumbar region: Secondary | ICD-10-CM | POA: Diagnosis not present

## 2015-11-30 DIAGNOSIS — M5431 Sciatica, right side: Secondary | ICD-10-CM | POA: Diagnosis not present

## 2015-11-30 DIAGNOSIS — M9903 Segmental and somatic dysfunction of lumbar region: Secondary | ICD-10-CM | POA: Diagnosis not present

## 2015-11-30 DIAGNOSIS — M9905 Segmental and somatic dysfunction of pelvic region: Secondary | ICD-10-CM | POA: Diagnosis not present

## 2015-12-02 DIAGNOSIS — M5431 Sciatica, right side: Secondary | ICD-10-CM | POA: Diagnosis not present

## 2015-12-02 DIAGNOSIS — M9905 Segmental and somatic dysfunction of pelvic region: Secondary | ICD-10-CM | POA: Diagnosis not present

## 2015-12-02 DIAGNOSIS — M9903 Segmental and somatic dysfunction of lumbar region: Secondary | ICD-10-CM | POA: Diagnosis not present

## 2015-12-02 DIAGNOSIS — M5136 Other intervertebral disc degeneration, lumbar region: Secondary | ICD-10-CM | POA: Diagnosis not present

## 2015-12-03 ENCOUNTER — Encounter: Payer: Self-pay | Admitting: Unknown Physician Specialty

## 2015-12-03 ENCOUNTER — Ambulatory Visit (INDEPENDENT_AMBULATORY_CARE_PROVIDER_SITE_OTHER): Payer: PPO | Admitting: Unknown Physician Specialty

## 2015-12-03 VITALS — BP 141/82 | HR 73 | Temp 97.7°F | Ht 65.7 in | Wt 204.6 lb

## 2015-12-03 DIAGNOSIS — M545 Low back pain, unspecified: Secondary | ICD-10-CM

## 2015-12-03 MED ORDER — CELECOXIB 200 MG PO CAPS: 200.0000 mg | ORAL_CAPSULE | Freq: Two times a day (BID) | ORAL | Status: DC

## 2015-12-03 NOTE — Assessment & Plan Note (Signed)
Hip ROM is normal without any anterior or lateral pain.  Will start with Celebrex 200 mg daily.  Consider switching Citalopram to Duloxetine and referral to pain clinic for injections.

## 2015-12-03 NOTE — Patient Instructions (Signed)
Take Celebrex once a day.  Stop Ibuprofen.  Take with food

## 2015-12-03 NOTE — Progress Notes (Signed)
-  BP 141/82 mmHg  Pulse 73  Temp(Src) 97.7 F (36.5 C)  Ht 5' 5.7" (1.669 m)  Wt 204 lb 9.6 oz (92.806 kg)  BMI 33.32 kg/m2  SpO2 96%  LMP  (LMP Unknown)   Subjective:    Patient ID: Mckenzie Gutierrez, female    DOB: 07-Jan-1922, 82 y.o.   MRN: [id number]  HPI: Mckenzie Gutierrez is a 82 y.o. female  Chief Complaint  Patient presents with  . Hip Pain    pt states she had a fall on 08/19/15 and fell on her left hip. She states she has more soreness than pain today, has taken ibuprofen today.    PtPt had a fall December 15.  She states she did have x-rays done with the chiropractor.  She is concerned with persistant pain.  She does "give out" more and "lacks energy."  Taking Ibuprofen which is helping some.  No night-time pain   Relevant past medical, surgical, family and social history reviewed and updated as indicated. Interim medical history since our last visit reviewed. Allergies and medications reviewed and updated.  Review of Systems  Per HPI unless specifically indicated above     Objective:    BP 141/82 mmHg  Pulse 73  Temp(Src) 97.7 F (36.5 C)  Ht 5' 5.7" (1.669 m)  Wt 204 lb 9.6 oz (92.806 kg)  BMI 33.32 kg/m2  SpO2 96%  LMP  (LMP Unknown)  Wt Readings from Last 3 Encounters:  12/03/15 204 lb 9.6 oz (92.806 kg)  10/27/15 202 lb (91.627 kg)  08/17/15 204 lb 6.4 oz (92.715 kg)    Physical Exam  Constitutional: She is oriented to person, place, and time. She appears well-developed and well-nourished. No distress.  HENT:  Head: Normocephalic and atraumatic.  Eyes: Conjunctivae and lids are normal. Right eye exhibits no discharge. Left eye exhibits no discharge. No scleral icterus.  Cardiovascular: Normal rate.   Pulmonary/Chest: Effort normal.  Abdominal: Normal appearance. There is no splenomegaly or hepatomegaly.  Musculoskeletal: Normal range of motion.       Right hip: She exhibits normal range of motion, normal strength, no tenderness, no bony  tenderness, no swelling, no crepitus, no deformity and no laceration.  Neurological: She is alert and oriented to person, place, and time.  Skin: Skin is intact. No rash noted. No pallor.  Psychiatric: She has a normal mood and affect. Her behavior is normal. Judgment and thought content normal.    Results for orders placed or performed in visit on 07/14/15  Microscopic Examination  Result Value Ref Range   WBC, UA 6-10 (A) 0 -  5 /hpf   RBC, UA 3-10 (A) 0 -  2 /hpf   Epithelial Cells (non renal) 0-10 0 - 10 /hpf   Bacteria, UA Moderate (A) None seen/Few  Microalbumin, Urine Waived  Result Value Ref Range   Microalb, Ur Waived 10 0 - 19 mg/L   Creatinine, Urine Waived 10 10 - 300 mg/dL   Microalb/Creat Ratio <30 <30 mg/g  Comprehensive metabolic panel  Result Value Ref Range   Glucose 109 (H) 65 - 99 mg/dL   BUN 11 10 - 36 mg/dL   Creatinine, Ser 0.86 0.57 - 1.00 mg/dL   GFR calc non Af Amer 59 (L) >59 mL/min/1.73   GFR calc Af Amer 68 >59 mL/min/1.73   BUN/Creatinine Ratio 13 11 - 26   Sodium 142 136 - 144 mmol/L   Potassium 3.9 3.5 - 5.2 mmol/L  Chloride 101 97 - 106 mmol/L   CO2 25 18 - 29 mmol/L   Calcium 9.6 8.7 - 10.3 mg/dL   Total Protein 7.0 6.0 - 8.5 g/dL   Albumin 4.7 (H) 3.2 - 4.6 g/dL   Globulin, Total 2.3 1.5 - 4.5 g/dL   Albumin/Globulin Ratio 2.0 1.1 - 2.5   Bilirubin Total 1.0 0.0 - 1.2 mg/dL   Alkaline Phosphatase 67 39 - 117 IU/L   AST 25 0 - 40 IU/L   ALT 14 0 - 32 IU/L  TSH  Result Value Ref Range   TSH 3.320 0.450 - 4.500 uIU/mL  Lipid Panel w/o Chol/HDL Ratio  Result Value Ref Range   Cholesterol, Total 196 100 - 199 mg/dL   Triglycerides 141 0 - 149 mg/dL   HDL 49 >39 mg/dL   VLDL Cholesterol Cal 28 5 - 40 mg/dL   LDL Calculated 119 (H) 0 - 99 mg/dL  UA/M w/rflx Culture, Routine  Result Value Ref Range   Urine Culture, Routine Final report (A)    Urine Culture result 1 Comment (A)    ANTIMICROBIAL SUSCEPTIBILITY Comment   Urine Culture,  Routine  Result Value Ref Range   Urine Culture result 1 Gram negative rods (A)       Assessment & Plan:   Problem List Items Addressed This Visit      Unprioritized   Low back pain - Primary    Hip ROM is normal without any anterior or lateral pain.  Will start with Celebrex 200 mg daily.  Consider switching Citalopram to Duloxetine and referral to pain clinic for injections.        Relevant Medications   celecoxib (CELEBREX) 200 MG capsule       Follow up plan: Return in about 2 weeks (around 12/17/2015).

## 2015-12-09 DIAGNOSIS — M5136 Other intervertebral disc degeneration, lumbar region: Secondary | ICD-10-CM | POA: Diagnosis not present

## 2015-12-09 DIAGNOSIS — M9905 Segmental and somatic dysfunction of pelvic region: Secondary | ICD-10-CM | POA: Diagnosis not present

## 2015-12-09 DIAGNOSIS — M5431 Sciatica, right side: Secondary | ICD-10-CM | POA: Diagnosis not present

## 2015-12-09 DIAGNOSIS — M9903 Segmental and somatic dysfunction of lumbar region: Secondary | ICD-10-CM | POA: Diagnosis not present

## 2015-12-14 DIAGNOSIS — M9903 Segmental and somatic dysfunction of lumbar region: Secondary | ICD-10-CM | POA: Diagnosis not present

## 2015-12-14 DIAGNOSIS — M9905 Segmental and somatic dysfunction of pelvic region: Secondary | ICD-10-CM | POA: Diagnosis not present

## 2015-12-14 DIAGNOSIS — M5431 Sciatica, right side: Secondary | ICD-10-CM | POA: Diagnosis not present

## 2015-12-14 DIAGNOSIS — M5136 Other intervertebral disc degeneration, lumbar region: Secondary | ICD-10-CM | POA: Diagnosis not present

## 2015-12-20 DIAGNOSIS — M9905 Segmental and somatic dysfunction of pelvic region: Secondary | ICD-10-CM | POA: Diagnosis not present

## 2015-12-20 DIAGNOSIS — M9903 Segmental and somatic dysfunction of lumbar region: Secondary | ICD-10-CM | POA: Diagnosis not present

## 2015-12-20 DIAGNOSIS — M5136 Other intervertebral disc degeneration, lumbar region: Secondary | ICD-10-CM | POA: Diagnosis not present

## 2015-12-20 DIAGNOSIS — M5431 Sciatica, right side: Secondary | ICD-10-CM | POA: Diagnosis not present

## 2015-12-21 ENCOUNTER — Encounter: Payer: Self-pay | Admitting: Unknown Physician Specialty

## 2015-12-21 ENCOUNTER — Ambulatory Visit (INDEPENDENT_AMBULATORY_CARE_PROVIDER_SITE_OTHER): Payer: PPO | Admitting: Unknown Physician Specialty

## 2015-12-21 VITALS — BP 164/66 | HR 74 | Temp 98.0°F | Ht 65.7 in | Wt 204.2 lb

## 2015-12-21 DIAGNOSIS — M545 Low back pain, unspecified: Secondary | ICD-10-CM

## 2015-12-21 DIAGNOSIS — I10 Essential (primary) hypertension: Secondary | ICD-10-CM | POA: Diagnosis not present

## 2015-12-21 MED ORDER — CELECOXIB 200 MG PO CAPS: 200.0000 mg | ORAL_CAPSULE | Freq: Every day | ORAL | Status: DC

## 2015-12-21 NOTE — Assessment & Plan Note (Signed)
Taper down Celebrex.  Monitor BP at home

## 2015-12-21 NOTE — Assessment & Plan Note (Signed)
Improved.  Continue with Celebrex but try to wean off as back improves

## 2015-12-21 NOTE — Progress Notes (Signed)
BP 164/66 mmHg  Pulse 74  Temp(Src) 98 F (36.7 C)  Ht 5' 5.7" (1.669 m)  Wt 204 lb 3.2 oz (92.625 kg)  BMI 33.25 kg/m2  SpO2 96%  LMP  (LMP Unknown)   Subjective:    Patient ID: Mckenzie Gutierrez, female    DOB: Jun 18, 1922, 82 y.o.   MRN: [id number]  HPI: Mckenzie Gutierrez is a 82 y.o. female  Chief Complaint  Patient presents with  . Hip Pain    2 week f/u for right hip/back pain   Hip and Back pain Pt states she is doing better and under the care of a chiropractor.  She was taking Celebrex and taking 1 QD.  Her BP is a little up today and ? Related to Celebrex.  States her BP is low 140's at home.    Relevant past medical, surgical, family and social history reviewed and updated as indicated. Interim medical history since our last visit reviewed. Allergies and medications reviewed and updated.  Review of Systems  Per HPI unless specifically indicated above     Objective:    BP 164/66 mmHg  Pulse 74  Temp(Src) 98 F (36.7 C)  Ht 5' 5.7" (1.669 m)  Wt 204 lb 3.2 oz (92.625 kg)  BMI 33.25 kg/m2  SpO2 96%  LMP  (LMP Unknown)  Wt Readings from Last 3 Encounters:  12/21/15 204 lb 3.2 oz (92.625 kg)  12/03/15 204 lb 9.6 oz (92.806 kg)  10/27/15 202 lb (91.627 kg)    Physical Exam  Constitutional: She is oriented to person, place, and time. She appears well-developed and well-nourished. No distress.  HENT:  Head: Normocephalic and atraumatic.  Eyes: Conjunctivae and lids are normal. Right eye exhibits no discharge. Left eye exhibits no discharge. No scleral icterus.  Cardiovascular: Normal rate.   Pulmonary/Chest: Effort normal.  Abdominal: Normal appearance. There is no splenomegaly or hepatomegaly.  Musculoskeletal: Normal range of motion.  Neurological: She is alert and oriented to person, place, and time.  Skin: Skin is intact. No rash noted. No pallor.  Psychiatric: She has a normal mood and affect. Her behavior is normal. Judgment and thought content  normal.    Results for orders placed or performed in visit on 07/14/15  Microscopic Examination  Result Value Ref Range   WBC, UA 6-10 (A) 0 -  5 /hpf   RBC, UA 3-10 (A) 0 -  2 /hpf   Epithelial Cells (non renal) 0-10 0 - 10 /hpf   Bacteria, UA Moderate (A) None seen/Few  Microalbumin, Urine Waived  Result Value Ref Range   Microalb, Ur Waived 10 0 - 19 mg/L   Creatinine, Urine Waived 10 10 - 300 mg/dL   Microalb/Creat Ratio <30 <30 mg/g  Comprehensive metabolic panel  Result Value Ref Range   Glucose 109 (H) 65 - 99 mg/dL   BUN 11 10 - 36 mg/dL   Creatinine, Ser 0.86 0.57 - 1.00 mg/dL   GFR calc non Af Amer 59 (L) >59 mL/min/1.73   GFR calc Af Amer 68 >59 mL/min/1.73   BUN/Creatinine Ratio 13 11 - 26   Sodium 142 136 - 144 mmol/L   Potassium 3.9 3.5 - 5.2 mmol/L   Chloride 101 97 - 106 mmol/L   CO2 25 18 - 29 mmol/L   Calcium 9.6 8.7 - 10.3 mg/dL   Total Protein 7.0 6.0 - 8.5 g/dL   Albumin 4.7 (H) 3.2 - 4.6 g/dL   Globulin, Total 2.3 1.5 -  4.5 g/dL   Albumin/Globulin Ratio 2.0 1.1 - 2.5   Bilirubin Total 1.0 0.0 - 1.2 mg/dL   Alkaline Phosphatase 67 39 - 117 IU/L   AST 25 0 - 40 IU/L   ALT 14 0 - 32 IU/L  TSH  Result Value Ref Range   TSH 3.320 0.450 - 4.500 uIU/mL  Lipid Panel w/o Chol/HDL Ratio  Result Value Ref Range   Cholesterol, Total 196 100 - 199 mg/dL   Triglycerides 141 0 - 149 mg/dL   HDL 49 >39 mg/dL   VLDL Cholesterol Cal 28 5 - 40 mg/dL   LDL Calculated 119 (H) 0 - 99 mg/dL  UA/M w/rflx Culture, Routine  Result Value Ref Range   Urine Culture, Routine Final report (A)    Urine Culture result 1 Comment (A)    ANTIMICROBIAL SUSCEPTIBILITY Comment   Urine Culture, Routine  Result Value Ref Range   Urine Culture result 1 Gram negative rods (A)       Assessment & Plan:   Problem List Items Addressed This Visit      Unprioritized   Hypertension    Taper down Celebrex.  Monitor BP at home      Low back pain - Primary    Improved.  Continue  with Celebrex but try to wean off as back improves      Relevant Medications   celecoxib (CELEBREX) 200 MG capsule       Follow up plan: Return for regular visit in May.

## 2015-12-28 DIAGNOSIS — M9905 Segmental and somatic dysfunction of pelvic region: Secondary | ICD-10-CM | POA: Diagnosis not present

## 2015-12-28 DIAGNOSIS — M5136 Other intervertebral disc degeneration, lumbar region: Secondary | ICD-10-CM | POA: Diagnosis not present

## 2015-12-28 DIAGNOSIS — M9903 Segmental and somatic dysfunction of lumbar region: Secondary | ICD-10-CM | POA: Diagnosis not present

## 2015-12-28 DIAGNOSIS — M5431 Sciatica, right side: Secondary | ICD-10-CM | POA: Diagnosis not present

## 2016-01-01 ENCOUNTER — Other Ambulatory Visit: Payer: Self-pay | Admitting: Unknown Physician Specialty

## 2016-01-04 DIAGNOSIS — M5431 Sciatica, right side: Secondary | ICD-10-CM | POA: Diagnosis not present

## 2016-01-04 DIAGNOSIS — M9905 Segmental and somatic dysfunction of pelvic region: Secondary | ICD-10-CM | POA: Diagnosis not present

## 2016-01-04 DIAGNOSIS — M5136 Other intervertebral disc degeneration, lumbar region: Secondary | ICD-10-CM | POA: Diagnosis not present

## 2016-01-04 DIAGNOSIS — M9903 Segmental and somatic dysfunction of lumbar region: Secondary | ICD-10-CM | POA: Diagnosis not present

## 2016-01-11 ENCOUNTER — Ambulatory Visit (INDEPENDENT_AMBULATORY_CARE_PROVIDER_SITE_OTHER): Payer: PPO | Admitting: Unknown Physician Specialty

## 2016-01-11 ENCOUNTER — Encounter: Payer: Self-pay | Admitting: Unknown Physician Specialty

## 2016-01-11 VITALS — BP 124/72 | HR 88 | Temp 97.9°F | Ht 65.3 in | Wt 198.4 lb

## 2016-01-11 DIAGNOSIS — I10 Essential (primary) hypertension: Secondary | ICD-10-CM

## 2016-01-11 NOTE — Assessment & Plan Note (Signed)
Check CMP.  Stable, continue present medications.

## 2016-01-11 NOTE — Progress Notes (Signed)
   BP 124/72 mmHg  Pulse 88  Temp(Src) 97.9 F (36.6 C)  Ht 5' 5.3" (1.659 m)  Wt 198 lb 6.4 oz (89.994 kg)  BMI 32.70 kg/m2  SpO2 92%  LMP  (LMP Unknown)   Subjective:    Patient ID: Mckenzie Gutierrez, female    DOB: [birth year], 82 y.o.   MRN: [id number]  HPI: Mckenzie Gutierrez is a 82 y.o. female  Chief Complaint  Patient presents with  . Hypertension  . Hypothyroidism   Hypertension Stopped Celebrex and BP improved Using medications without difficulty Average home BPs 118-140/60's-70s  No problems or lightheadedness No chest pain with exertion or shortness of breath No Edema    Relevant past medical, surgical, family and social history reviewed and updated as indicated. Interim medical history since our last visit reviewed. Allergies and medications reviewed and updated.  Review of Systems  Per HPI unless specifically indicated above     Objective:    BP 124/72 mmHg  Pulse 88  Temp(Src) 97.9 F (36.6 C)  Ht 5' 5.3" (1.659 m)  Wt 198 lb 6.4 oz (89.994 kg)  BMI 32.70 kg/m2  SpO2 92%  LMP  (LMP Unknown)  Wt Readings from Last 3 Encounters:  01/11/16 198 lb 6.4 oz (89.994 kg)  12/21/15 204 lb 3.2 oz (92.625 kg)  12/03/15 204 lb 9.6 oz (92.806 kg)    Physical Exam  Constitutional: She is oriented to person, place, and time. She appears well-developed and well-nourished. No distress.  HENT:  Head: Normocephalic and atraumatic.  Eyes: Conjunctivae and lids are normal. Right eye exhibits no discharge. Left eye exhibits no discharge. No scleral icterus.  Neck: Normal range of motion. Neck supple. No JVD present. Carotid bruit is not present.  Cardiovascular: Normal rate, regular rhythm and normal heart sounds.   Pulmonary/Chest: Effort normal and breath sounds normal.  Abdominal: Normal appearance. There is no splenomegaly or hepatomegaly.  Musculoskeletal: Normal range of motion.  Neurological: She is alert and oriented to person, place, and time.  Skin:  Skin is warm, dry and intact. No rash noted. No pallor.  Psychiatric: She has a normal mood and affect. Her behavior is normal. Judgment and thought content normal.      Assessment & Plan:   Problem List Items Addressed This Visit      Unprioritized   Hypertension - Primary    Check CMP.  Stable, continue present medications.            Follow up plan: Return in about 6 months (around 07/13/2016) for physical.

## 2016-01-12 DIAGNOSIS — M5136 Other intervertebral disc degeneration, lumbar region: Secondary | ICD-10-CM | POA: Diagnosis not present

## 2016-01-12 DIAGNOSIS — M5431 Sciatica, right side: Secondary | ICD-10-CM | POA: Diagnosis not present

## 2016-01-12 DIAGNOSIS — M9905 Segmental and somatic dysfunction of pelvic region: Secondary | ICD-10-CM | POA: Diagnosis not present

## 2016-01-12 DIAGNOSIS — M9903 Segmental and somatic dysfunction of lumbar region: Secondary | ICD-10-CM | POA: Diagnosis not present

## 2016-01-18 DIAGNOSIS — H353132 Nonexudative age-related macular degeneration, bilateral, intermediate dry stage: Secondary | ICD-10-CM | POA: Diagnosis not present

## 2016-02-08 DIAGNOSIS — I63239 Cerebral infarction due to unspecified occlusion or stenosis of unspecified carotid arteries: Secondary | ICD-10-CM | POA: Diagnosis not present

## 2016-02-08 DIAGNOSIS — I6523 Occlusion and stenosis of bilateral carotid arteries: Secondary | ICD-10-CM | POA: Diagnosis not present

## 2016-02-09 DIAGNOSIS — M9905 Segmental and somatic dysfunction of pelvic region: Secondary | ICD-10-CM | POA: Diagnosis not present

## 2016-02-09 DIAGNOSIS — M9903 Segmental and somatic dysfunction of lumbar region: Secondary | ICD-10-CM | POA: Diagnosis not present

## 2016-02-09 DIAGNOSIS — M5136 Other intervertebral disc degeneration, lumbar region: Secondary | ICD-10-CM | POA: Diagnosis not present

## 2016-02-09 DIAGNOSIS — M5431 Sciatica, right side: Secondary | ICD-10-CM | POA: Diagnosis not present

## 2016-02-16 ENCOUNTER — Emergency Department
Admission: EM | Admit: 2016-02-16 | Discharge: 2016-02-16 | Disposition: A | Discharge status: Home / Self Care | Payer: PPO | Attending: Emergency Medicine | Admitting: Emergency Medicine

## 2016-02-16 ENCOUNTER — Emergency Department: Payer: PPO

## 2016-02-16 ENCOUNTER — Encounter: Payer: Self-pay | Admitting: *Deleted

## 2016-02-16 DIAGNOSIS — E785 Hyperlipidemia, unspecified: Secondary | ICD-10-CM | POA: Insufficient documentation

## 2016-02-16 DIAGNOSIS — Z7982 Long term (current) use of aspirin: Secondary | ICD-10-CM | POA: Insufficient documentation

## 2016-02-16 DIAGNOSIS — M545 Low back pain, unspecified: Secondary | ICD-10-CM

## 2016-02-16 DIAGNOSIS — Z79899 Other long term (current) drug therapy: Secondary | ICD-10-CM | POA: Insufficient documentation

## 2016-02-16 DIAGNOSIS — M47816 Spondylosis without myelopathy or radiculopathy, lumbar region: Secondary | ICD-10-CM | POA: Diagnosis not present

## 2016-02-16 DIAGNOSIS — N3090 Cystitis, unspecified without hematuria: Secondary | ICD-10-CM | POA: Insufficient documentation

## 2016-02-16 DIAGNOSIS — I10 Essential (primary) hypertension: Secondary | ICD-10-CM | POA: Diagnosis not present

## 2016-02-16 DIAGNOSIS — M810 Age-related osteoporosis without current pathological fracture: Secondary | ICD-10-CM | POA: Insufficient documentation

## 2016-02-16 DIAGNOSIS — F329 Major depressive disorder, single episode, unspecified: Secondary | ICD-10-CM | POA: Diagnosis not present

## 2016-02-16 LAB — URINALYSIS COMPLETE WITH MICROSCOPIC (ARMC ONLY)
BILIRUBIN URINE: NEGATIVE
Glucose, UA: NEGATIVE mg/dL
Ketones, ur: NEGATIVE mg/dL
NITRITE: POSITIVE — AB
Protein, ur: NEGATIVE mg/dL
Specific Gravity, Urine: 1.005 (ref 1.005–1.030)
pH: 7 (ref 5.0–8.0)

## 2016-02-16 MED ORDER — CEPHALEXIN 500 MG PO CAPS: 500.0000 mg | ORAL_CAPSULE | Freq: Two times a day (BID) | ORAL | Status: DC

## 2016-02-16 NOTE — ED Provider Notes (Signed)
[facility] Emergency Department Provider Note  ____________________________________________  Time seen: 3:25 PM  I have reviewed the triage vital signs and the nursing notes.   HISTORY  Chief Complaint Back Pain    HPI Mckenzie Gutierrez is a 82 y.o. female who reports that as she was using the bathroom this morning, she made a twisting motion with her thorax to reach the toilet paper and had sudden sharp pain in her left lower back. It hurts with movement and is intermittent lasting just a few seconds at a time, nonradiating. No lower extremity weakness or other neurologic symptoms. She recently had problems with pain in the right lower back which was resolved with heat therapy NSAIDs and primary care follow-up. She otherwise feels fine and has been taking all her medications. No dizziness chest pain or shortness of breath. No abdominal pain. No nausea vomiting or diarrhea.    Past Medical History  Diagnosis Date  . Thyroid disease   . Hyperlipidemia   . GERD (gastroesophageal reflux disease)   . Osteoporosis   . Depression   . Anxiety   . Hypertension   . Urinary frequency   . Carotid stenosis   . Bradycardia   . Atrial premature beats   . Low back pain      Patient Active Problem List   Diagnosis Date Noted  . Low back pain 12/03/2015  . HLD (hyperlipidemia) 07/12/2015  . Hypothyroid 07/12/2015  . Osteoporosis 07/12/2015  . Atrial premature beats 07/12/2015  . Bradycardia 07/12/2015  . Carotid stenosis 07/12/2015  . Anxiety and depression 07/12/2015  . Urinary frequency 07/12/2015  . Hypertension 07/12/2015  . Alopecia 07/12/2015  . GERD (gastroesophageal reflux disease) 07/12/2015  . Carotid artery plaque 02/03/2014  . Heart valve disease 02/03/2014     Past Surgical History  Procedure Laterality Date  . Carpal tunnel release    . Wrist surgery       Current Outpatient Rx  Name  Route  Sig  Dispense  Refill  . amLODipine  (NORVASC) 5 MG tablet      TAKE ONE TABLET BY MOUTH ONCE DAILY   90 tablet   0   . aspirin 81 MG tablet   Oral   Take 81 mg by mouth daily.         . beta carotene w/minerals (OCUVITE) tablet   Oral   Take 1 tablet by mouth daily.         . Biotin 5000 MCG CAPS   Oral   Take by mouth daily.         . cephALEXin (KEFLEX) 500 MG capsule   Oral   Take 1 capsule (500 mg total) by mouth 2 (two) times daily.   14 capsule   0   . citalopram (CELEXA) 20 MG tablet   Oral   Take by mouth. Take 1/2 tablet daily at bedtime         . fluconazole (DIFLUCAN) 100 MG tablet   Oral   Take 1 tablet (100 mg total) by mouth daily. Patient not taking: Reported on 12/03/2015   7 tablet   0   . furosemide (LASIX) 20 MG tablet      TAKE ONE TABLET BY MOUTH ONCE DAILY   90 tablet   0   . levothyroxine (SYNTHROID, LEVOTHROID) 75 MCG tablet      TAKE ONE TABLET BY MOUTH ONCE DAILY   90 tablet   0   . losartan (COZAAR)  100 MG tablet      TAKE ONE TABLET BY MOUTH ONCE DAILY   90 tablet   0   . Multiple Vitamin (MULTIVITAMIN) tablet   Oral   Take 1 tablet by mouth daily.         Marland Kitchen nystatin ointment (MYCOSTATIN)   Topical   Apply 1 application topically 2 (two) times daily. Patient not taking: Reported on 12/03/2015   30 g   1   . pantoprazole (PROTONIX) 20 MG tablet      TAKE ONE TABLET BY MOUTH ONCE DAILY   90 tablet   0      Allergies Celebrex; Codeine sulfate; Hydrochlorothiazide; Hytrin; Plendil; and Nitrofuran derivatives   Family History  Problem Relation Age of Onset  . Heart disease Mother   . Stroke Father   . Diabetes Father   . Stroke Sister     Social History Social History  Substance Use Topics  . Smoking status: Never Smoker   . Smokeless tobacco: Never Used  . Alcohol Use: No    Review of Systems  Constitutional:   No fever or chills.  Eyes:   No vision changes.  ENT:   No sore throat. No rhinorrhea. Cardiovascular:   No chest  pain. Respiratory:   No dyspnea or cough. Gastrointestinal:   Negative for abdominal pain, vomiting and diarrhea.  Genitourinary:   Negative for dysuria or difficulty urinating. Musculoskeletal:   Left lower back pain as above Neurological:   Negative for headaches 10-point ROS otherwise negative.  ____________________________________________   PHYSICAL EXAM:  VITAL SIGNS: ED Triage Vitals  Enc Vitals Group     BP 02/16/16 1449 142/83 mmHg     Pulse Rate 02/16/16 1449 62     Resp 02/16/16 1449 18     Temp 02/16/16 1449 97.8 F (36.6 C)     Temp Source 02/16/16 1449 Oral     SpO2 02/16/16 1449 96 %     Weight 02/16/16 1449 198 lb (89.812 kg)     Height 02/16/16 1449 5\' 6"  (1.676 m)     Head Cir --      Peak Flow --      Pain Score 02/16/16 1450 8     Pain Loc --      Pain Edu? --      Excl. in [place]? --     Vital signs reviewed, nursing assessments reviewed.   Constitutional:   Alert and oriented. Well appearing and in no distress. Eyes:   No scleral icterus. No conjunctival pallor. PERRL. EOMI.  No nystagmus. ENT   Head:   Normocephalic and atraumatic.   Nose:   No congestion/rhinnorhea. No septal hematoma   Mouth/Throat:   MMM, no pharyngeal erythema. No peritonsillar mass.    Neck:   No stridor. No SubQ emphysema. No meningismus. Hematological/Lymphatic/Immunilogical:   No cervical lymphadenopathy. Cardiovascular:   RRR. Symmetric bilateral radial and DP pulses.  No murmurs.  Respiratory:   Normal respiratory effort without tachypnea nor retractions. Breath sounds are clear and equal bilaterally. No wheezes/rales/rhonchi. Gastrointestinal:   Soft and nontender. Non distended. There is no CVA tenderness.  No rebound, rigidity, or guarding. No pulsatile mass, no bruit Genitourinary:   deferred Musculoskeletal:   Nontender with normal range of motion in all extremities. No joint effusions.  No lower extremity tenderness.  No edema. No midline spinal tenderness  .  In the musculature of the left lower back just inferior to the posterior false ribs, there  is a small area of pinpoint soft tissue tenderness. There are no inflammatory changes or evidence of infection in this area.  Deep palpation of the structures deep to this location from the left lateral side or from the anterior abdomen does not reproduce the pain. Neurologic:   Normal speech and language.  CN 2-10 normal. Motor grossly intact. No gross focal neurologic deficits are appreciated.  Skin:    Skin is warm, dry and intact. No rash noted.  No petechiae, purpura, or bullae.  ____________________________________________    LABS (pertinent positives/negatives) (all labs ordered are listed, but only abnormal results are displayed) Labs Reviewed  URINALYSIS COMPLETEWITH MICROSCOPIC ([place]) - Abnormal; Notable for the following:    Color, Urine YELLOW (*)    APPearance CLOUDY (*)    Hgb urine dipstick 1+ (*)    Nitrite POSITIVE (*)    Leukocytes, UA 3+ (*)    Bacteria, UA MANY (*)    Squamous Epithelial / LPF 0-5 (*)    All other components within normal limits  URINE CULTURE   ____________________________________________   EKG    ____________________________________________    RADIOLOGY  X-ray lumbar spine unremarkable  ____________________________________________   PROCEDURES   ____________________________________________   INITIAL IMPRESSION / ASSESSMENT AND PLAN / ED COURSE  Pertinent labs & imaging results that were available during my care of the patient were reviewed by me and considered in my medical decision making (see chart for details).  Patient very well-appearing no acute distress. Presents with clearly musculoskeletal low back pain. No evidence of vascular injury or spinal injury. Low suspicion for AAA dissection pyelonephritis sepsis obstructing kidney stone perforation or obstruction. We'll urinalysis and lumbar x-ray to evaluate for occult  pathology although this is clearly musculoskeletal.    ----------------------------------------- 5:22 PM on 02/16/2016 -----------------------------------------  X-ray negative. Urinalysis shows a clear urinary tract infection despite the fact that the patient has no classic urinary tract infection symptoms. She is very well appearing and anxious to go home, ambulatory. Vital signs unremarkable. Low suspicion for pyelonephritis or sepsis still. I'll start her on Keflex, send urine culture, follow up with primary care.   ____________________________________________   FINAL CLINICAL IMPRESSION(S) / ED DIAGNOSES  Final diagnoses:  Left-sided low back pain without sciatica  Cystitis       Portions of this note were generated with dragon dictation software. Dictation errors may occur despite best attempts at proofreading.   Carrie Mew, MD 02/16/16 1723

## 2016-02-16 NOTE — Discharge Instructions (Signed)
Back Pain, Adult °Back pain is very common in adults. The cause of back pain is rarely dangerous and the pain often gets better over time. The cause of your back pain may not be known. Some common causes of back pain include: °· Strain of the muscles or ligaments supporting the spine. °· Wear and tear (degeneration) of the spinal disks. °· Arthritis. °· Direct injury to the back. °For many people, back pain may return. Since back pain is rarely dangerous, most people can learn to manage this condition on their own. °HOME CARE INSTRUCTIONS °Watch your back pain for any changes. The following actions may help to lessen any discomfort you are feeling: °· Remain active. It is stressful on your back to sit or stand in one place for long periods of time. Do not sit, drive, or stand in one place for more than 30 minutes at a time. Take short walks on even surfaces as soon as you are able. Try to increase the length of time you walk each day. °· Exercise regularly as directed by your health care provider. Exercise helps your back heal faster. It also helps avoid future injury by keeping your muscles strong and flexible. °· Do not stay in bed. Resting more than 1-2 days can delay your recovery. °· Pay attention to your body when you bend and lift. The most comfortable positions are those that put less stress on your recovering back. Always use proper lifting techniques, including: °· Bending your knees. °· Keeping the load close to your body. °· Avoiding twisting. °· Find a comfortable position to sleep. Use a firm mattress and lie on your side with your knees slightly bent. If you lie on your back, put a pillow under your knees. °· Avoid feeling anxious or stressed. Stress increases muscle tension and can worsen back pain. It is important to recognize when you are anxious or stressed and learn ways to manage it, such as with exercise. °· Take medicines only as directed by your health care provider. Over-the-counter  medicines to reduce pain and inflammation are often the most helpful. Your health care provider may prescribe muscle relaxant drugs. These medicines help dull your pain so you can more quickly return to your normal activities and healthy exercise. °· Apply ice to the injured area: °· Put ice in a plastic bag. °· Place a towel between your skin and the bag. °· Leave the ice on for 20 minutes, 2-3 times a day for the first 2-3 days. After that, ice and heat may be alternated to reduce pain and spasms. °· Maintain a healthy weight. Excess weight puts extra stress on your back and makes it difficult to maintain good posture. °SEEK MEDICAL CARE IF: °· You have pain that is not relieved with rest or medicine. °· You have increasing pain going down into the legs or buttocks. °· You have pain that does not improve in one week. °· You have night pain. °· You lose weight. °· You have a fever or chills. °SEEK IMMEDIATE MEDICAL CARE IF:  °· You develop new bowel or bladder control problems. °· You have unusual weakness or numbness in your arms or legs. °· You develop nausea or vomiting. °· You develop abdominal pain. °· You feel faint. °  °This information is not intended to replace advice given to you by your health care provider. Make sure you discuss any questions you have with your health care provider. °  °Document Released: 08/21/2005 Document Revised: 09/11/2014 Document Reviewed: 12/23/2013 °Elsevier Interactive Patient Education ©2016 Elsevier   Inc[employer]  Heat Therapy Heat therapy can help ease sore, stiff, injured, and tight muscles and joints. Heat relaxes your muscles, which may help ease your pain.  RISKS AND COMPLICATIONS If you have any of the following conditions, do not use heat therapy unless your health care provider has approved:  Poor circulation.  Healing wounds or scarred skin in the area being treated.  Diabetes, heart disease, or high blood pressure.  Not being able to feel (numbness) the area  being treated.  Unusual swelling of the area being treated.  Active infections.  Blood clots.  Cancer.  Inability to communicate pain. This may include young children and people who have problems with their brain function (dementia).  Pregnancy. Heat therapy should only be used on old, pre-existing, or long-lasting (chronic) injuries. Do not use heat therapy on new injuries unless directed by your health care provider. HOW TO USE HEAT THERAPY There are several different kinds of heat therapy, including:  Moist heat pack.  Warm water bath.  Hot water bottle.  Electric heating pad.  Heated gel pack.  Heated wrap.  Electric heating pad. Use the heat therapy method suggested by your health care provider. Follow your health care provider's instructions on when and how to use heat therapy. GENERAL HEAT THERAPY RECOMMENDATIONS  Do not sleep while using heat therapy. Only use heat therapy while you are awake.  Your skin may turn pink while using heat therapy. Do not use heat therapy if your skin turns red.  Do not use heat therapy if you have new pain.  High heat or long exposure to heat can cause burns. Be careful when using heat therapy to avoid burning your skin.  Do not use heat therapy on areas of your skin that are already irritated, such as with a rash or sunburn. SEEK MEDICAL CARE IF:  You have blisters, redness, swelling, or numbness.  You have new pain.  Your pain is worse. MAKE SURE YOU:  Understand these instructions.  Will watch your condition.  Will get help right away if you are not doing well or get worse.   This information is not intended to replace advice given to you by your health care provider. Make sure you discuss any questions you have with your health care provider.   Document Released: 11/13/2011 Document Revised: 09/11/2014 Document Reviewed: 10/14/2013 Elsevier Interactive Patient Education 2016 Elsevier Inc.  Urinary Tract  Infection Urinary tract infections (UTIs) can develop anywhere along your urinary tract. Your urinary tract is your body's drainage system for removing wastes and extra water. Your urinary tract includes two kidneys, two ureters, a bladder, and a urethra. Your kidneys are a pair of bean-shaped organs. Each kidney is about the size of your fist. They are located below your ribs, one on each side of your spine. CAUSES Infections are caused by microbes, which are microscopic organisms, including fungi, viruses, and bacteria. These organisms are so small that they can only be seen through a microscope. Bacteria are the microbes that most commonly cause UTIs. SYMPTOMS  Symptoms of UTIs may vary by age and gender of the patient and by the location of the infection. Symptoms in young women typically include a frequent and intense urge to urinate and a painful, burning feeling in the bladder or urethra during urination. Older women and men are more likely to be tired, shaky, and weak and have muscle aches and abdominal pain. A fever may mean the infection is in your kidneys. Other symptoms of  a kidney infection include pain in your back or sides below the ribs, nausea, and vomiting. DIAGNOSIS To diagnose a UTI, your caregiver will ask you about your symptoms. Your caregiver will also ask you to provide a urine sample. The urine sample will be tested for bacteria and white blood cells. White blood cells are made by your body to help fight infection. TREATMENT  Typically, UTIs can be treated with medication. Because most UTIs are caused by a bacterial infection, they usually can be treated with the use of antibiotics. The choice of antibiotic and length of treatment depend on your symptoms and the type of bacteria causing your infection. HOME CARE INSTRUCTIONS  If you were prescribed antibiotics, take them exactly as your caregiver instructs you. Finish the medication even if you feel better after you have only  taken some of the medication.  Drink enough water and fluids to keep your urine clear or pale yellow.  Avoid caffeine, tea, and carbonated beverages. They tend to irritate your bladder.  Empty your bladder often. Avoid holding urine for long periods of time.  Empty your bladder before and after sexual intercourse.  After a bowel movement, women should cleanse from front to back. Use each tissue only once. SEEK MEDICAL CARE IF:   You have back pain.  You develop a fever.  Your symptoms do not begin to resolve within 3 days. SEEK IMMEDIATE MEDICAL CARE IF:   You have severe back pain or lower abdominal pain.  You develop chills.  You have nausea or vomiting.  You have continued burning or discomfort with urination. MAKE SURE YOU:   Understand these instructions.  Will watch your condition.  Will get help right away if you are not doing well or get worse.   This information is not intended to replace advice given to you by your health care provider. Make sure you discuss any questions you have with your health care provider.   Document Released: 05/31/2005 Document Revised: 05/12/2015 Document Reviewed: 09/29/2011 Elsevier Interactive Patient Education [employer].

## 2016-02-16 NOTE — ED Notes (Signed)
States this AM she was on the toilet and twisted to get toilet paper and felt a twinge in her left back area, states she is currently seeing a chiroprocter for back pain, awake and alert in no acute distress

## 2016-02-16 NOTE — ED Notes (Signed)
Pt reports she has left side pain around her waist line - this pain came on when the pt reports that she "twisted" - pain present since this am - pt denies having this pain before - Pt denies nausea/vomiting - Pt denies urinary frequency or pain with urination

## 2016-02-19 LAB — URINE CULTURE: Culture: >=100000 — AB

## 2016-03-01 ENCOUNTER — Telehealth: Payer: Self-pay

## 2016-03-01 ENCOUNTER — Ambulatory Visit: Payer: PPO | Admitting: Family Medicine

## 2016-03-01 ENCOUNTER — Encounter: Payer: Self-pay | Admitting: *Deleted

## 2016-03-01 ENCOUNTER — Emergency Department
Admission: EM | Admit: 2016-03-01 | Discharge: 2016-03-01 | Disposition: A | Discharge status: Home / Self Care | Payer: PPO | Attending: Student | Admitting: Student

## 2016-03-01 DIAGNOSIS — M810 Age-related osteoporosis without current pathological fracture: Secondary | ICD-10-CM | POA: Diagnosis not present

## 2016-03-01 DIAGNOSIS — K649 Unspecified hemorrhoids: Secondary | ICD-10-CM | POA: Insufficient documentation

## 2016-03-01 DIAGNOSIS — F329 Major depressive disorder, single episode, unspecified: Secondary | ICD-10-CM | POA: Diagnosis not present

## 2016-03-01 DIAGNOSIS — Z79899 Other long term (current) drug therapy: Secondary | ICD-10-CM | POA: Insufficient documentation

## 2016-03-01 DIAGNOSIS — E039 Hypothyroidism, unspecified: Secondary | ICD-10-CM | POA: Diagnosis not present

## 2016-03-01 DIAGNOSIS — E785 Hyperlipidemia, unspecified: Secondary | ICD-10-CM | POA: Diagnosis not present

## 2016-03-01 DIAGNOSIS — Z7982 Long term (current) use of aspirin: Secondary | ICD-10-CM | POA: Diagnosis not present

## 2016-03-01 DIAGNOSIS — K625 Hemorrhage of anus and rectum: Secondary | ICD-10-CM | POA: Diagnosis not present

## 2016-03-01 DIAGNOSIS — I10 Essential (primary) hypertension: Secondary | ICD-10-CM | POA: Diagnosis not present

## 2016-03-01 LAB — CBC WITH DIFFERENTIAL/PLATELET
BASOS ABS: 0.1 10*3/uL (ref 0–0.1)
BASOS PCT: 1 %
EOS PCT: 1 %
Eosinophils Absolute: 0 10*3/uL (ref 0–0.7)
HCT: 40.7 % (ref 35.0–47.0)
Hemoglobin: 14.1 g/dL (ref 12.0–16.0)
Lymphocytes Relative: 33 %
Lymphs Abs: 2.3 10*3/uL (ref 1.0–3.6)
MCH: 33.4 pg (ref 26.0–34.0)
MCHC: 34.5 g/dL (ref 32.0–36.0)
MCV: 96.7 fL (ref 80.0–100.0)
MONO ABS: 0.5 10*3/uL (ref 0.2–0.9)
Monocytes Relative: 8 %
Neutro Abs: 4 10*3/uL (ref 1.4–6.5)
Neutrophils Relative %: 57 %
PLATELETS: 161 10*3/uL (ref 150–440)
RBC: 4.21 MIL/uL (ref 3.80–5.20)
RDW: 14.4 % (ref 11.5–14.5)
WBC: 6.9 10*3/uL (ref 3.6–11.0)

## 2016-03-01 LAB — PROTIME-INR
INR: 1.01
Prothrombin Time: 13.5 seconds (ref 11.4–15.0)

## 2016-03-01 LAB — COMPREHENSIVE METABOLIC PANEL
ALBUMIN: 4.6 g/dL (ref 3.5–5.0)
ALT: 15 U/L (ref 14–54)
AST: 25 U/L (ref 15–41)
Alkaline Phosphatase: 61 U/L (ref 38–126)
Anion gap: 9 (ref 5–15)
BUN: 13 mg/dL (ref 6–20)
CHLORIDE: 105 mmol/L (ref 101–111)
CO2: 26 mmol/L (ref 22–32)
Calcium: 9.6 mg/dL (ref 8.9–10.3)
Creatinine, Ser: 0.8 mg/dL (ref 0.44–1.00)
GFR calc Af Amer: >60 mL/min (ref >60)
GFR calc non Af Amer: >60 mL/min (ref >60)
GLUCOSE: 119 mg/dL — AB (ref 65–99)
POTASSIUM: 3.7 mmol/L (ref 3.5–5.1)
SODIUM: 140 mmol/L (ref 135–145)
Total Bilirubin: 0.8 mg/dL (ref 0.3–1.2)
Total Protein: 7.4 g/dL (ref 6.5–8.1)

## 2016-03-01 LAB — TYPE AND SCREEN
ABO/RH(D): A POS
ANTIBODY SCREEN: NEGATIVE

## 2016-03-01 LAB — APTT: APTT: 30 s (ref 24–36)

## 2016-03-01 LAB — LIPASE, BLOOD: LIPASE: 18 U/L (ref 11–51)

## 2016-03-01 MED ORDER — HYDROCORTISONE 2.5 % RE CREA: 1.0000 "application " | TOPICAL_CREAM | Freq: Two times a day (BID) | RECTAL | Status: DC

## 2016-03-01 MED ORDER — POLYETHYLENE GLYCOL 3350 17 GM/SCOOP PO POWD: ORAL | Status: DC

## 2016-03-01 NOTE — ED Provider Notes (Signed)
[facility] Emergency Department Provider Note   ____________________________________________  Time seen: Approximately 10:51 AM  I have reviewed the triage vital signs and the nursing notes.   HISTORY  Chief Complaint Rectal Bleeding    HPI Mckenzie Gutierrez is a 82 y.o. female with history of hypertension, hyperlipidemia, GERD, thyroid disease who presents for evaluation of painless rectal bleeding intermittently for about a month, gradual onset, mild to moderate, no modifying factors. Patient reports that she has seen bright blood from her rectum in the toilet bowl after stooling and when she wipes. She typically does not have any bleeding without a bowel movement. She reports that when she is having a bowel movement it feels like "it's scratching my hemorrhoids". She had some bleeding on the toilet paper last night but has had 3 formed stools this morning without blood. She denies any abdominal pain, no chest pain, no difficulty breathing, no nausea, vomiting, diarrhea, fevers or chills. She is on 81 mg daily aspirin but is otherwise not chronically anticoagulated. No history of GI bleed.   Past Medical History  Diagnosis Date  . Thyroid disease   . Hyperlipidemia   . GERD (gastroesophageal reflux disease)   . Osteoporosis   . Depression   . Anxiety   . Hypertension   . Urinary frequency   . Carotid stenosis   . Bradycardia   . Atrial premature beats   . Low back pain     Patient Active Problem List   Diagnosis Date Noted  . Low back pain 12/03/2015  . HLD (hyperlipidemia) 07/12/2015  . Hypothyroid 07/12/2015  . Osteoporosis 07/12/2015  . Atrial premature beats 07/12/2015  . Bradycardia 07/12/2015  . Carotid stenosis 07/12/2015  . Anxiety and depression 07/12/2015  . Urinary frequency 07/12/2015  . Hypertension 07/12/2015  . Alopecia 07/12/2015  . GERD (gastroesophageal reflux disease) 07/12/2015  . Carotid artery plaque 02/03/2014  .  Heart valve disease 02/03/2014    Past Surgical History  Procedure Laterality Date  . Carpal tunnel release    . Wrist surgery      Current Outpatient Rx  Name  Route  Sig  Dispense  Refill  . amLODipine (NORVASC) 5 MG tablet      TAKE ONE TABLET BY MOUTH ONCE DAILY   90 tablet   0   . aspirin 81 MG tablet   Oral   Take 81 mg by mouth daily.         . beta carotene w/minerals (OCUVITE) tablet   Oral   Take 1 tablet by mouth daily.         . Biotin 5000 MCG CAPS   Oral   Take by mouth daily.         . cephALEXin (KEFLEX) 500 MG capsule   Oral   Take 1 capsule (500 mg total) by mouth 2 (two) times daily.   14 capsule   0   . citalopram (CELEXA) 20 MG tablet   Oral   Take by mouth. Take 1/2 tablet daily at bedtime         . furosemide (LASIX) 20 MG tablet      TAKE ONE TABLET BY MOUTH ONCE DAILY   90 tablet   0   . levothyroxine (SYNTHROID, LEVOTHROID) 75 MCG tablet      TAKE ONE TABLET BY MOUTH ONCE DAILY   90 tablet   0   . losartan (COZAAR) 100 MG tablet      TAKE  ONE TABLET BY MOUTH ONCE DAILY   90 tablet   0   . Multiple Vitamin (MULTIVITAMIN) tablet   Oral   Take 1 tablet by mouth daily.         . pantoprazole (PROTONIX) 20 MG tablet      TAKE ONE TABLET BY MOUTH ONCE DAILY   90 tablet   0     Allergies Celebrex; Codeine sulfate; Hydrochlorothiazide; Hytrin; Plendil; and Nitrofuran derivatives  Family History  Problem Relation Age of Onset  . Heart disease Mother   . Stroke Father   . Diabetes Father   . Stroke Sister     Social History Social History  Substance Use Topics  . Smoking status: Never Smoker   . Smokeless tobacco: Never Used  . Alcohol Use: No    Review of Systems Constitutional: No fever/chills Eyes: No visual changes. ENT: No sore throat. Cardiovascular: Denies chest pain. Respiratory: Denies shortness of breath. Gastrointestinal: No abdominal pain.  No nausea, no vomiting.  No diarrhea.  No  constipation. Genitourinary: Negative for dysuria. Musculoskeletal: Negative for back pain. Skin: Negative for rash. Neurological: Negative for headaches, focal weakness or numbness.  10-point ROS otherwise negative.  ____________________________________________   PHYSICAL EXAM:  VITAL SIGNS: ED Triage Vitals  Enc Vitals Group     BP 03/01/16 1043 169/104 mmHg     Pulse Rate 03/01/16 1043 80     Resp 03/01/16 1043 18     Temp 03/01/16 1043 97.9 F (36.6 C)     Temp Source 03/01/16 1043 Oral     SpO2 03/01/16 1043 96 %     Weight 03/01/16 1043 200 lb (90.719 kg)     Height 03/01/16 1043 5\' 6"  (1.676 m)     Head Cir --      Peak Flow --      Pain Score 03/01/16 1046 0     Pain Loc --      Pain Edu? --      Excl. in [place]? --     Constitutional: Alert and oriented. Well appearing and in no acute distress. Eyes: Conjunctivae are normal. PERRL. EOMI. Head: Atraumatic. Nose: No congestion/rhinnorhea. Mouth/Throat: Mucous membranes are moist.  Oropharynx non-erythematous. Neck: No stridor.  Supple without meningismus. Cardiovascular: Normal rate, regular rhythm. Grossly normal heart sounds.  Good peripheral circulation. Respiratory: Normal respiratory effort.  No retractions. Lungs CTAB. Gastrointestinal: Soft and nontender. No distention. No CVA tenderness. Genitourinary: deferred Rectal: clear mucous in the rectal vault is guaiac negative, no blood. Several large external hemorrhoids noted, no anal fissure. Musculoskeletal: No lower extremity tenderness nor edema.  No joint effusions. Neurologic:  Normal speech and language. No gross focal neurologic deficits are appreciated. No gait instability. Skin:  Skin is warm, dry and intact. No rash noted. Psychiatric: Mood and affect are normal. Speech and behavior are normal.  ____________________________________________   LABS (all labs ordered are listed, but only abnormal results are displayed)  Labs Reviewed  COMPREHENSIVE  METABOLIC PANEL - Abnormal; Notable for the following:    Glucose, Bld 119 (*)    All other components within normal limits  CBC WITH DIFFERENTIAL/PLATELET  LIPASE, BLOOD  PROTIME-INR  APTT  TYPE AND SCREEN   ____________________________________________  EKG  none ____________________________________________  RADIOLOGY  none ____________________________________________   PROCEDURES  Procedure(s) performed: None  Critical Care performed: No  ____________________________________________   INITIAL IMPRESSION / ASSESSMENT AND PLAN / ED COURSE  Pertinent labs & imaging results that were available during my  care of the patient were reviewed by me and considered in my medical decision making (see chart for details).  Mckenzie Gutierrez is a 82 y.o. female with history of hypertension, hyperlipidemia, GERD, thyroid disease who presents for evaluation of painless rectal bleeding intermittently for about a month. On exam, she is very well-appearing and in no acute distress, her vital signs are stable, she is afebrile. She has a benign abdominal exam. Her rectal exam shows several large friable external hemorrhoids, no gross blood, guaiac negative mucus in the rectal vault. I discussed with her that I suspect her bleeding is related to her hemorrhoids. In the absence of abdominal pain or fever, I doubt that this represents diverticulitis, we'll obtain screening labs and observe in the emergency department briefly. If she is observed without recurrent bleeding, with stable vital signs and with reassuring hemoglobin, I suspect she can be discharged home with close outpatient PCP follow-up and possibly general surgery follow-up for excision of hemorrhoids, anusol and miralax.  ----------------------------------------- 1:55 PM on 03/01/2016 ----------------------------------------- Patient observed in the emergency department for 3 hours without any rectal bleeding. She is sitting up in the  bed, reports she feels well, would like to be discharged. CBC, CMP, lipase, INR are unremarkable. Specifically, her hemoglobin is 14.1 which is reassuring. In the absence of any bleeding since 4 AM, I don't think that there is any utility in checking a repeat hemoglobin At this time. I discussed the case with Kathrine Haddock, the nurse practitioner [facility] and the patient will be seen in clinic tomorrow at 10 AM for repeat evaluation. I discussed this with the patient, we discussed meticulous return precautions and need for close follow-up and she is comfortable with the discharge plan. DC home. ____________________________________________   FINAL CLINICAL IMPRESSION(S) / ED DIAGNOSES  Final diagnoses:  Rectal bleeding  Hemorrhoids, unspecified hemorrhoid type      NEW MEDICATIONS STARTED DURING THIS VISIT:  New Prescriptions   No medications on file     Note:  This document was prepared using Dragon voice recognition software and may include unintentional dictation errors.    Joanne Gavel, MD 03/01/16 1357

## 2016-03-01 NOTE — Discharge Instructions (Signed)
You were seen in the emergency department for bleeding from your rectum which I suspect is due to hemorrhoids. Follow-up with your health care provider tomorrow morning at 10 AM. Return immediately to the emergency department if at any point you have recurrent bleeding from your rectum, abdominal pain, fevers, vomiting, diarrhea, chest pain, difficulty breathing, lightheadedness, fainting or near fainting, or for any other concerns.

## 2016-03-01 NOTE — Telephone Encounter (Signed)
I called patient to get more information about their complaint of rectal bleeding. Per Merrie Roof, PA if she is having any heavy bleeding, she needs to go to the ER for eval instead of coming to our office. I spoke with patient's daughter who scheduled the appointment and started that she's had several episodes of rectal bleeding and overnight, she had a bowel movement and it was "dripping". She said she's had 2 more bowel movements with no blood. I advised them that with her age and the face that she had "dripping" blood, it would be better to go to ER for eval now and have labs and possibly CT scan. They understood and agreed.

## 2016-03-01 NOTE — ED Notes (Signed)
MD Edd Fabian at bedside. Pt states she has been seeing blood in her stool for at least 2 weeks. Pt states was "dripping" last Sunday and at about 4 am today she noticed blood when wiping.

## 2016-03-01 NOTE — ED Notes (Signed)
Pt arrives with complaints of rectal bleeding, bright red blood, states this has been going on and off for 3 weeks or so, states hx of hemorroids

## 2016-03-01 NOTE — ED Notes (Signed)
Pt discharged home after verbalizing understanding of discharge instructions; nad noted. 

## 2016-03-02 ENCOUNTER — Ambulatory Visit (INDEPENDENT_AMBULATORY_CARE_PROVIDER_SITE_OTHER): Payer: PPO | Admitting: Family Medicine

## 2016-03-02 ENCOUNTER — Encounter: Payer: Self-pay | Admitting: Family Medicine

## 2016-03-02 VITALS — BP 128/66 | HR 73 | Temp 98.4°F | Wt 201.0 lb

## 2016-03-02 DIAGNOSIS — K648 Other hemorrhoids: Secondary | ICD-10-CM | POA: Diagnosis not present

## 2016-03-02 DIAGNOSIS — K644 Residual hemorrhoidal skin tags: Secondary | ICD-10-CM

## 2016-03-02 NOTE — Progress Notes (Signed)
BP 128/66 mmHg  Pulse 73  Temp(Src) 98.4 F (36.9 C)  Wt 201 lb (91.173 kg)  SpO2 97%  LMP  (LMP Unknown)   Subjective:    Patient ID: Mckenzie Gutierrez, female    DOB: Jan 11, 1922, 82 y.o.   MRN: [id number]  HPI: Mckenzie Gutierrez is a 82 y.o. female  Chief Complaint  Patient presents with  . Rectal Bleeding    Happens off and on. Was checked out at the ER yesterday and hemoglobin was good and they said she had several hemorrhoids. She had a little bit of blood this morning after passing gas. She has had a bowel movement this morning and there was no blood then.   Patient presents today for ER f/u for intermittent episodes of rectal bleeding the past month. Has noticed hemorrhoids in this time, with some discomfort during BMs. Admits to straining to initiate BMs at times.  Most recent episode started yesterday morning, where she noticed she was dripping bright red blood into the toilet during a BM. Went to ER, where hgb, INR, and other labs were reassuring. Rectal exam revealed multiple external hemorrhoids. Stool guiac negative during visit. Patient was discharged with miralax and anusol, which she started taking last night with no adverse effects noted.  Has not noticed any bleeding with BMs since discharge from the ER. Small amount of blood in panties this morning after passing gas. Feels very well today, no dizziness, fatigue, or SOB.  Relevant past medical, surgical, family and social history reviewed and updated as indicated. Interim medical history since our last visit reviewed. Allergies and medications reviewed and updated.  Review of Systems  Constitutional: Negative.   Respiratory: Negative.   Cardiovascular: Negative.   Gastrointestinal: Positive for constipation, blood in stool and rectal pain (mild, with BMs). Negative for nausea, vomiting, abdominal pain, diarrhea and abdominal distention.  Genitourinary: Negative.   Skin: Negative.   Neurological: Negative.    Psychiatric/Behavioral: Negative.     Per HPI unless specifically indicated above     Objective:    BP 128/66 mmHg  Pulse 73  Temp(Src) 98.4 F (36.9 C)  Wt 201 lb (91.173 kg)  SpO2 97%  LMP  (LMP Unknown)  Wt Readings from Last 3 Encounters:  03/02/16 201 lb (91.173 kg)  03/01/16 200 lb (90.719 kg)  02/16/16 198 lb (89.812 kg)    Physical Exam  Constitutional: She is oriented to person, place, and time. She appears well-developed and well-nourished. No distress.  HENT:  Head: Atraumatic.  Eyes: Conjunctivae are normal. No scleral icterus.  Neck: Normal range of motion. Neck supple.  Cardiovascular: Normal rate.   Pulmonary/Chest: Effort normal and breath sounds normal. No respiratory distress.  Abdominal: Soft. She exhibits no distension. There is no tenderness. There is no guarding.  Genitourinary:  Patient declined rectal exam as she had one yesterday in ED.    Musculoskeletal: Normal range of motion.  Neurological: She is alert and oriented to person, place, and time.  Skin: Skin is warm and dry.  Psychiatric: She has a normal mood and affect. Her behavior is normal.    Results for orders placed or performed during the hospital encounter of 03/01/16  CBC with Differential  Result Value Ref Range   WBC 6.9 3.6 - 11.0 K/uL   RBC 4.21 3.80 - 5.20 MIL/uL   Hemoglobin 14.1 12.0 - 16.0 g/dL   HCT 40.7 35.0 - 47.0 %   MCV 96.7 80.0 - 100.0 fL  MCH 33.4 26.0 - 34.0 pg   MCHC 34.5 32.0 - 36.0 g/dL   RDW 14.4 11.5 - 14.5 %   Platelets 161 150 - 440 K/uL   Neutrophils Relative % 57 %   Neutro Abs 4.0 1.4 - 6.5 K/uL   Lymphocytes Relative 33 %   Lymphs Abs 2.3 1.0 - 3.6 K/uL   Monocytes Relative 8 %   Monocytes Absolute 0.5 0.2 - 0.9 K/uL   Eosinophils Relative 1 %   Eosinophils Absolute 0.0 0 - 0.7 K/uL   Basophils Relative 1 %   Basophils Absolute 0.1 0 - 0.1 K/uL  Comprehensive metabolic panel  Result Value Ref Range   Sodium 140 135 - 145 mmol/L    Potassium 3.7 3.5 - 5.1 mmol/L   Chloride 105 101 - 111 mmol/L   CO2 26 22 - 32 mmol/L   Glucose, Bld 119 (H) 65 - 99 mg/dL   BUN 13 6 - 20 mg/dL   Creatinine, Ser 0.80 0.44 - 1.00 mg/dL   Calcium 9.6 8.9 - 10.3 mg/dL   Total Protein 7.4 6.5 - 8.1 g/dL   Albumin 4.6 3.5 - 5.0 g/dL   AST 25 15 - 41 U/L   ALT 15 14 - 54 U/L   Alkaline Phosphatase 61 38 - 126 U/L   Total Bilirubin 0.8 0.3 - 1.2 mg/dL   GFR calc non Af Amer >60 >60 mL/min   GFR calc Af Amer >60 >60 mL/min   Anion gap 9 5 - 15  Lipase, blood  Result Value Ref Range   Lipase 18 11 - 51 U/L  Protime-INR  Result Value Ref Range   Prothrombin Time 13.5 11.4 - 15.0 seconds   INR 1.01   APTT  Result Value Ref Range   aPTT 30 24 - 36 seconds  Type and screen [facility]  Result Value Ref Range   ABO/RH(D) A POS    Antibody Screen NEG    Sample Expiration 03/04/2016       Assessment & Plan:   Problem List Items Addressed This Visit    None    Visit Diagnoses    External hemorrhoids    -  Primary    Continue daily miralax, increase to BID if still having to strain with BMs. Continue anusol until flare has subsided, then prn. Encouraged good PO fluid intake       Discussed with patient that if the hemorrhoids become thrombosed or intolerably painful to let us know so we can refer her to a surgeon for removal. Patient aware that if bleeding becomes uncontrollable to go directly to the ER.   Follow up plan: Return if symptoms worsen or fail to improve.

## 2016-03-02 NOTE — Patient Instructions (Addendum)

## 2016-03-14 ENCOUNTER — Ambulatory Visit (INDEPENDENT_AMBULATORY_CARE_PROVIDER_SITE_OTHER): Payer: PPO | Admitting: Surgery

## 2016-03-14 ENCOUNTER — Encounter: Payer: Self-pay | Admitting: Surgery

## 2016-03-14 VITALS — BP 144/102 | HR 71 | Temp 96.8°F | Ht 66.0 in | Wt 201.0 lb

## 2016-03-14 DIAGNOSIS — K648 Other hemorrhoids: Secondary | ICD-10-CM

## 2016-03-14 DIAGNOSIS — K644 Residual hemorrhoidal skin tags: Secondary | ICD-10-CM | POA: Insufficient documentation

## 2016-03-14 MED ORDER — POLYETHYLENE GLYCOL POWD: 17.0000 g | Freq: Every day | Status: DC

## 2016-03-14 NOTE — Patient Instructions (Signed)
Make sure that you are drinking as much water as possible. We try for 72 oz. Daily of water.  The key to keeping hemorrhoids from bleeding, is keeping constipation under control.  Call with any questions or concerns.    Constipation, Adult Constipation is when a person has fewer than three bowel movements a week, has difficulty having a bowel movement, or has stools that are dry, hard, or larger than normal. As people grow older, constipation is more common. A low-fiber diet, not taking in enough fluids, and taking certain medicines may make constipation worse.  CAUSES   Certain medicines, such as antidepressants, pain medicine, iron supplements, antacids, and water pills.   Certain diseases, such as diabetes, irritable bowel syndrome (IBS), thyroid disease, or depression.   Not drinking enough water.   Not eating enough fiber-rich foods.   Stress or travel.   Lack of physical activity or exercise.   Ignoring the urge to have a bowel movement.   Using laxatives too much.  SIGNS AND SYMPTOMS   Having fewer than three bowel movements a week.   Straining to have a bowel movement.   Having stools that are hard, dry, or larger than normal.   Feeling full or bloated.   Pain in the lower abdomen.   Not feeling relief after having a bowel movement.  DIAGNOSIS  Your health care provider will take a medical history and perform a physical exam. Further testing may be done for severe constipation. Some tests may include:  A barium enema X-ray to examine your rectum, colon, and, sometimes, your small intestine.   A sigmoidoscopy to examine your lower colon.   A colonoscopy to examine your entire colon. TREATMENT  Treatment will depend on the severity of your constipation and what is causing it. Some dietary treatments include drinking more fluids and eating more fiber-rich foods. Lifestyle treatments may include regular exercise. If these diet and lifestyle  recommendations do not help, your health care provider may recommend taking over-the-counter laxative medicines to help you have bowel movements. Prescription medicines may be prescribed if over-the-counter medicines do not work.  HOME CARE INSTRUCTIONS   Eat foods that have a lot of fiber, such as fruits, vegetables, whole grains, and beans.  Limit foods high in fat and processed sugars, such as french fries, hamburgers, cookies, candies, and soda.   A fiber supplement may be added to your diet if you cannot get enough fiber from foods.   Drink enough fluids to keep your urine clear or pale yellow.   Exercise regularly or as directed by your health care provider.   Go to the restroom when you have the urge to go. Do not hold it.   Only take over-the-counter or prescription medicines as directed by your health care provider. Do not take other medicines for constipation without talking to your health care provider first.  [place] IF:   You have bright red blood in your stool.   Your constipation lasts for more than 4 days or gets worse.   You have abdominal or rectal pain.   You have thin, pencil-like stools.   You have unexplained weight loss. MAKE SURE YOU:   Understand these instructions.  Will watch your condition.  Will get help right away if you are not doing well or get worse.   This information is not intended to replace advice given to you by your health care provider. Make sure you discuss any questions you have with your  health care provider.   Document Released: 05/19/2004 Document Revised: 09/11/2014 Document Reviewed: 06/02/2013 Elsevier Interactive Patient Education 2016 [employer].   Hemorrhoids Hemorrhoids are swollen veins around the rectum or anus. There are two types of hemorrhoids:   Internal hemorrhoids. These occur in the veins just inside the rectum. They may poke through to the outside and become irritated and  painful.  External hemorrhoids. These occur in the veins outside the anus and can be felt as a painful swelling or hard lump near the anus. CAUSES  Pregnancy.   Obesity.   Constipation or diarrhea.   Straining to have a bowel movement.   Sitting for long periods on the toilet.  Heavy lifting or other activity that caused you to strain.  Anal intercourse. SYMPTOMS   Pain.   Anal itching or irritation.   Rectal bleeding.   Fecal leakage.   Anal swelling.   One or more lumps around the anus.  DIAGNOSIS  Your caregiver may be able to diagnose hemorrhoids by visual examination. Other examinations or tests that may be performed include:   Examination of the rectal area with a gloved hand (digital rectal exam).   Examination of anal canal using a small tube (scope).   A blood test if you have lost a significant amount of blood.  A test to look inside the colon (sigmoidoscopy or colonoscopy). TREATMENT Most hemorrhoids can be treated at home. However, if symptoms do not seem to be getting better or if you have a lot of rectal bleeding, your caregiver may perform a procedure to help make the hemorrhoids get smaller or remove them completely. Possible treatments include:   Placing a rubber band at the base of the hemorrhoid to cut off the circulation (rubber band ligation).   Injecting a chemical to shrink the hemorrhoid (sclerotherapy).   Using a tool to burn the hemorrhoid (infrared light therapy).   Surgically removing the hemorrhoid (hemorrhoidectomy).   Stapling the hemorrhoid to block blood flow to the tissue (hemorrhoid stapling).  HOME CARE INSTRUCTIONS   Eat foods with fiber, such as whole grains, beans, nuts, fruits, and vegetables. Ask your doctor about taking products with added fiber in them (fibersupplements).  Increase fluid intake. Drink enough water and fluids to keep your urine clear or pale yellow.   Exercise regularly.   Go to  the bathroom when you have the urge to have a bowel movement. Do not wait.   Avoid straining to have bowel movements.   Keep the anal area dry and clean. Use wet toilet paper or moist towelettes after a bowel movement.   Medicated creams and suppositories may be used or applied as directed.   Only take over-the-counter or prescription medicines as directed by your caregiver.   Take warm sitz baths for 15-20 minutes, 3-4 times a day to ease pain and discomfort.   Place ice packs on the hemorrhoids if they are tender and swollen. Using ice packs between sitz baths may be helpful.   Put ice in a plastic bag.   Place a towel between your skin and the bag.   Leave the ice on for 15-20 minutes, 3-4 times a day.   Do not use a donut-shaped pillow or sit on the toilet for long periods. This increases blood pooling and pain.  SEEK MEDICAL CARE IF:  You have increasing pain and swelling that is not controlled by treatment or medicine.  You have uncontrolled bleeding.  You have difficulty or you are unable  to have a bowel movement.  You have pain or inflammation outside the area of the hemorrhoids. MAKE SURE YOU:  Understand these instructions.  Will watch your condition.  Will get help right away if you are not doing well or get worse.   This information is not intended to replace advice given to you by your health care provider. Make sure you discuss any questions you have with your health care provider.   Document Released: 08/18/2000 Document Revised: 08/07/2012 Document Reviewed: 06/25/2012 Elsevier Interactive Patient Education [employer].

## 2016-03-14 NOTE — Progress Notes (Signed)
Subjective:     Patient ID: Mckenzie Gutierrez, female   DOB: [birth year], 82 y.o.   MRN: [id number]  HPI  82 yr old female Who was recently seen in the emergency room for bright red blood per rectum area patient has had a long-standing history of hemorrhoids. Her last colonoscopy was about 20 years ago where he did see hemorrhoids at that time. Patient states that she's had them for a while however they did get worse and cause some significant bleeding which was concerning. Patient did not get any dizziness or lightheadedness. Patient was having some harder stools that she did have to strain with poor hard to pass. Since being seen in the emergency room she has been taking MiraLAX daily and this is resolved her constipation issues. Patient states that she has not had any blood except maybe a very small amount on the tissue when she wipes since that time as well. Patient denies any abdominal pain any diarrhea or any other changes in her bowels any change in the caliber of her stools any dark tarry stools or any other symptoms.  Past Medical History  Diagnosis Date  . Thyroid disease   . Hyperlipidemia   . GERD (gastroesophageal reflux disease)   . Osteoporosis   . Depression   . Anxiety   . Hypertension   . Urinary frequency   . Carotid stenosis   . Bradycardia   . Atrial premature beats   . Low back pain    Past Surgical History  Procedure Laterality Date  . Carpal tunnel release    . Wrist surgery     Family History  Problem Relation Age of Onset  . Heart disease Mother   . Stroke Father   . Diabetes Father   . Stroke Sister    Social History   Social History  . Marital Status: Married    Spouse Name: N/A  . Number of Children: N/A  . Years of Education: N/A   Social History Main Topics  . Smoking status: Never Smoker   . Smokeless tobacco: Never Used  . Alcohol Use: No  . Drug Use: No  . Sexual Activity: No   Other Topics Concern  . None   Social History Narrative     Current outpatient prescriptions:  .  amLODipine (NORVASC) 5 MG tablet, TAKE ONE TABLET BY MOUTH ONCE DAILY, Disp: 90 tablet, Rfl: 0 .  aspirin 81 MG tablet, Take 81 mg by mouth daily., Disp: , Rfl:  .  beta carotene w/minerals (OCUVITE) tablet, Take 1 tablet by mouth daily., Disp: , Rfl:  .  Biotin 5000 MCG CAPS, Take by mouth daily., Disp: , Rfl:  .  citalopram (CELEXA) 20 MG tablet, Take by mouth. Take 1/2 tablet daily at bedtime, Disp: , Rfl:  .  furosemide (LASIX) 20 MG tablet, TAKE ONE TABLET BY MOUTH ONCE DAILY, Disp: 90 tablet, Rfl: 0 .  hydrocortisone (PROCTOZONE-HC) 2.5 % rectal cream, Place 1 application rectally 2 (two) times daily., Disp: 30 g, Rfl: 0 .  levothyroxine (SYNTHROID, LEVOTHROID) 75 MCG tablet, TAKE ONE TABLET BY MOUTH ONCE DAILY, Disp: 90 tablet, Rfl: 0 .  losartan (COZAAR) 100 MG tablet, TAKE ONE TABLET BY MOUTH ONCE DAILY, Disp: 90 tablet, Rfl: 0 .  Multiple Vitamin (MULTIVITAMIN) tablet, Take 1 tablet by mouth daily., Disp: , Rfl:  .  pantoprazole (PROTONIX) 20 MG tablet, TAKE ONE TABLET BY MOUTH ONCE DAILY, Disp: 90 tablet, Rfl: 0 .  Polyethylene Glycol POWD, Take  17 g by mouth daily., Disp: 2000 g, Rfl: 3 Allergies  Allergen Reactions  . Celebrex [Celecoxib] Other (See Comments)    Hard stools  . Codeine Sulfate Other (See Comments)  . Hydrochlorothiazide   . Hytrin [Terazosin]   . Plendil [Felodipine] Diarrhea and Nausea Only  . Nitrofuran Derivatives Swelling and Rash       Review of Systems  Constitutional: Negative for activity change, appetite change and fatigue.  HENT: Negative for congestion and sore throat.   Respiratory: Negative for cough, shortness of breath and wheezing.   Cardiovascular: Negative for chest pain, palpitations and leg swelling.  Gastrointestinal: Positive for constipation and anal bleeding. Negative for nausea, vomiting, abdominal pain, diarrhea and blood in stool.  Genitourinary: Negative for dysuria, urgency and  hematuria.  Musculoskeletal: Negative for back pain and neck pain.  Skin: Negative for color change and wound.  Neurological: Negative for dizziness and weakness.  Hematological: Negative for adenopathy. Does not bruise/bleed easily.  Psychiatric/Behavioral: Negative for agitation. The patient is not nervous/anxious.   All other systems reviewed and are negative.      Filed Vitals:   03/14/16 0913  BP: 144/102  Pulse: 71  Temp: 96.8 F (36 C)    Objective:   Physical Exam  Constitutional: She is oriented to person, place, and time. She appears well-developed and well-nourished. No distress.  HENT:  Head: Normocephalic and atraumatic.  Right Ear: External ear normal.  Left Ear: External ear normal.  Nose: Nose normal.  Mouth/Throat: Oropharynx is clear and moist. No oropharyngeal exudate.  Eyes: Conjunctivae and EOM are normal. Pupils are equal, round, and reactive to light. No scleral icterus.  Neck: Normal range of motion. Neck supple. No tracheal deviation present.  Cardiovascular: Normal rate, regular rhythm, normal heart sounds and intact distal pulses.  Exam reveals no gallop and no friction rub.   No murmur heard. Pulmonary/Chest: Effort normal and breath sounds normal. No respiratory distress. She has no wheezes. She has no rales.  Abdominal: Soft. Bowel sounds are normal. She exhibits no distension and no mass. There is no tenderness.  Genitourinary:  Rectal: small multiple external hemorrhoids, non-inflamed, good tone, no masses  Musculoskeletal: Normal range of motion. She exhibits no edema or tenderness.  Neurological: She is alert and oriented to person, place, and time. No cranial nerve deficit.  Skin: Skin is warm and dry. No rash noted. No erythema.  Psychiatric: She has a normal mood and affect. Her behavior is normal. Judgment and thought content normal.  Vitals reviewed.      Assessment:     82yr old female with external hemorrhoids    Plan:      Review this patient's past medical history she has a surprisingly healthy 82 year old with some anxiety depression hypertension and GERD which are all well controlled with medications.  I personally reviewed her laboratory values which were all within normal limits. I discussed with the patient that given her hemorrhoids and her age that operation would likely not benefit her because more harm than good. I did discuss with her that the first treatment with this would be medical to increase her water intake to at least 8 glasses a day and to continue with the MiraLAX. Patient is very happy with this plan as is her daughter. They're instructed that if she were to continue to have bright red blood or any other issues to give [place] a call.

## 2016-03-27 ENCOUNTER — Telehealth: Payer: Self-pay | Admitting: Unknown Physician Specialty

## 2016-03-27 ENCOUNTER — Other Ambulatory Visit: Payer: Self-pay

## 2016-03-27 MED ORDER — LEVOTHYROXINE SODIUM 75 MCG PO TABS: 75.0000 ug | ORAL_TABLET | Freq: Every day | ORAL | 0 refills | Status: DC

## 2016-03-27 MED ORDER — AMLODIPINE BESYLATE 5 MG PO TABS: 5.0000 mg | ORAL_TABLET | Freq: Every day | ORAL | 0 refills | Status: DC

## 2016-03-27 MED ORDER — FUROSEMIDE 20 MG PO TABS: 20.0000 mg | ORAL_TABLET | Freq: Every day | ORAL | 0 refills | Status: DC

## 2016-03-27 MED ORDER — PANTOPRAZOLE SODIUM 20 MG PO TBEC: 20.0000 mg | DELAYED_RELEASE_TABLET | Freq: Every day | ORAL | 1 refills | Status: DC

## 2016-03-27 MED ORDER — CITALOPRAM HYDROBROMIDE 20 MG PO TABS: 20.0000 mg | ORAL_TABLET | Freq: Every day | ORAL | 1 refills | Status: DC

## 2016-03-27 MED ORDER — LOSARTAN POTASSIUM 100 MG PO TABS: 100.0000 mg | ORAL_TABLET | Freq: Every day | ORAL | 1 refills | Status: DC

## 2016-03-27 NOTE — Telephone Encounter (Signed)
Opened in error

## 2016-03-27 NOTE — Telephone Encounter (Signed)
Routing to provider  

## 2016-03-27 NOTE — Telephone Encounter (Signed)
Pt stated she needed a refill on amLODipine (NORVASC) 5 MG tablet, citalopram (CELEXA) 20 MG tablet, furosemide (LASIX) 20 MG tablet, levothyroxine (SYNTHROID, LEVOTHROID) 75 MCG tablet, losartan (COZAAR) 100 MG tablet, and  pantoprazole (PROTONIX) 20 MG tablet. She would also like to change her [facility].

## 2016-03-28 ENCOUNTER — Other Ambulatory Visit: Payer: Self-pay | Admitting: Unknown Physician Specialty

## 2016-04-27 ENCOUNTER — Telehealth: Payer: Self-pay | Admitting: Surgery

## 2016-04-27 NOTE — Telephone Encounter (Signed)
Patient called and stated that she would like to talk to [employer] regarding some bleeding she is having. She would not tell me anything else.

## 2016-04-28 MED ORDER — HYDROCORTISONE 2.5 % RE CREA: 1.0000 "application " | TOPICAL_CREAM | Freq: Two times a day (BID) | RECTAL | 3 refills | Status: DC

## 2016-04-28 NOTE — Telephone Encounter (Signed)
Patient is calling back today to speak to [employer].

## 2016-04-28 NOTE — Telephone Encounter (Signed)
Spoke with patient at this time. She states that she has ran out of her Proctozone and as noticed bright red rectal bleeding for the last 2-3 days after bowel movements only. Denies abdominal pain and nausea or vomiting.  Refill for Proctozone sent to pharmacy at this time.  I reiterated to increase water intake to 72 oz daily, continue to take Miralax daily, and add a stool softener if bowels are difficult to pass.  Patient verbalizes understanding.

## 2016-05-01 ENCOUNTER — Ambulatory Visit: Payer: PPO | Admitting: Unknown Physician Specialty

## 2016-06-20 ENCOUNTER — Other Ambulatory Visit: Payer: Self-pay | Admitting: Unknown Physician Specialty

## 2016-06-23 NOTE — Telephone Encounter (Signed)
Your patient 

## 2016-06-24 ENCOUNTER — Other Ambulatory Visit: Payer: Self-pay | Admitting: Unknown Physician Specialty

## 2016-07-18 DIAGNOSIS — H353131 Nonexudative age-related macular degeneration, bilateral, early dry stage: Secondary | ICD-10-CM | POA: Diagnosis not present

## 2016-07-19 ENCOUNTER — Encounter: Payer: Self-pay | Admitting: Unknown Physician Specialty

## 2016-07-19 ENCOUNTER — Ambulatory Visit (INDEPENDENT_AMBULATORY_CARE_PROVIDER_SITE_OTHER): Payer: PPO | Admitting: Unknown Physician Specialty

## 2016-07-19 VITALS — BP 139/81 | HR 82 | Temp 97.6°F | Ht 66.5 in | Wt 201.0 lb

## 2016-07-19 DIAGNOSIS — R531 Weakness: Secondary | ICD-10-CM

## 2016-07-19 DIAGNOSIS — I499 Cardiac arrhythmia, unspecified: Secondary | ICD-10-CM

## 2016-07-19 DIAGNOSIS — M545 Low back pain: Secondary | ICD-10-CM | POA: Diagnosis not present

## 2016-07-19 DIAGNOSIS — I10 Essential (primary) hypertension: Secondary | ICD-10-CM | POA: Diagnosis not present

## 2016-07-19 DIAGNOSIS — R0602 Shortness of breath: Secondary | ICD-10-CM | POA: Insufficient documentation

## 2016-07-19 DIAGNOSIS — G8929 Other chronic pain: Secondary | ICD-10-CM | POA: Diagnosis not present

## 2016-07-19 DIAGNOSIS — Z0000 Encounter for general adult medical examination without abnormal findings: Secondary | ICD-10-CM

## 2016-07-19 DIAGNOSIS — I4891 Unspecified atrial fibrillation: Secondary | ICD-10-CM | POA: Insufficient documentation

## 2016-07-19 DIAGNOSIS — R5383 Other fatigue: Secondary | ICD-10-CM | POA: Insufficient documentation

## 2016-07-19 DIAGNOSIS — E039 Hypothyroidism, unspecified: Secondary | ICD-10-CM | POA: Diagnosis not present

## 2016-07-19 NOTE — Progress Notes (Signed)
BP 139/81 (BP Location: Left Arm, Patient Position: Sitting, Cuff Size: Large)   Pulse 82   Temp 97.6 F (36.4 C)   Ht 5' 6.5" (1.689 m)   Wt 201 lb (91.2 kg)   LMP  (LMP Unknown)   SpO2 97%   BMI 31.96 kg/m    Subjective:    Patient ID: Mckenzie Gutierrez, female    DOB: Mar 26, 1922, 82 y.o.   MRN: [id number]  HPI: Mckenzie Gutierrez is a 82 y.o. female  Chief Complaint  Patient presents with  . Medicare Wellness   Hypertension Using medications without difficulty Average home BPs   No problems or lightheadedness No chest pain with exertion or shortness of breath No Edema  Rectal bleeding  Has been to GI.  She is taking Miralax which is helping.    Depression screen [facility] 2/9 07/19/2016 07/14/2015  Decreased Interest 1 0  Down, Depressed, Hopeless 1 0  PHQ - 2 Score 2 0  Altered sleeping 0 -  Tired, decreased energy 3 -  Change in appetite 0 -  Feeling bad or failure about yourself  1 -  Trouble concentrating 0 -  Moving slowly or fidgety/restless 0 -  Suicidal thoughts 0 -  PHQ-9 Score 6 -   Back pain Back pain all the time and takes one Aleve and one Tylenol  Relevant past medical, surgical, family and social history reviewed and updated as indicated. Interim medical history since our last visit reviewed. Allergies and medications reviewed and updated.  Review of Systems  Constitutional: Positive for activity change.       Feeling weaker  HENT: Negative.   Eyes: Negative.   Respiratory: Positive for shortness of breath.   Cardiovascular: Negative.   Gastrointestinal: Negative.   Endocrine: Negative.   Genitourinary: Positive for frequency.       Gets up 2-3 times at night to use the bathroom  Musculoskeletal: Positive for back pain.       [place] and improved with chiropractic care.  Low back hurts "all the time."  Allergic/Immunologic: Negative.   Neurological: Negative.   Hematological: Negative.   Psychiatric/Behavioral: Negative.     Per HPI  unless specifically indicated above     Objective:    BP 139/81 (BP Location: Left Arm, Patient Position: Sitting, Cuff Size: Large)   Pulse 82   Temp 97.6 F (36.4 C)   Ht 5' 6.5" (1.689 m)   Wt 201 lb (91.2 kg)   LMP  (LMP Unknown)   SpO2 97%   BMI 31.96 kg/m   Wt Readings from Last 3 Encounters:  07/19/16 201 lb (91.2 kg)  03/14/16 201 lb (91.2 kg)  03/02/16 201 lb (91.2 kg)    Physical Exam  Constitutional: She is oriented to person, place, and time. She appears well-developed and well-nourished. No distress.  HENT:  Head: Normocephalic and atraumatic.  Eyes: Conjunctivae and lids are normal. Right eye exhibits no discharge. Left eye exhibits no discharge. No scleral icterus.  Neck: Normal range of motion. Neck supple. No JVD present. Carotid bruit is not present.  Cardiovascular: An irregularly irregular rhythm present.  Murmur heard.  Systolic murmur is present with a grade of 2/6  Pulmonary/Chest: Effort normal and breath sounds normal.  Abdominal: Normal appearance. There is no splenomegaly or hepatomegaly.  Musculoskeletal: Normal range of motion.  Neurological: She is alert and oriented to person, place, and time.  Skin: Skin is warm, dry and intact. No rash noted. No pallor.  Psychiatric: She has a normal mood and affect. Her behavior is normal. Judgment and thought content normal.   EKG shows A-fib  Results for orders placed or performed during the hospital encounter of 03/01/16  CBC with Differential  Result Value Ref Range   WBC 6.9 3.6 - 11.0 K/uL   RBC 4.21 3.80 - 5.20 MIL/uL   Hemoglobin 14.1 12.0 - 16.0 g/dL   HCT 40.7 35.0 - 47.0 %   MCV 96.7 80.0 - 100.0 fL   MCH 33.4 26.0 - 34.0 pg   MCHC 34.5 32.0 - 36.0 g/dL   RDW 14.4 11.5 - 14.5 %   Platelets 161 150 - 440 K/uL   Neutrophils Relative % 57 %   Neutro Abs 4.0 1.4 - 6.5 K/uL   Lymphocytes Relative 33 %   Lymphs Abs 2.3 1.0 - 3.6 K/uL   Monocytes Relative 8 %   Monocytes Absolute 0.5 0.2 -  0.9 K/uL   Eosinophils Relative 1 %   Eosinophils Absolute 0.0 0 - 0.7 K/uL   Basophils Relative 1 %   Basophils Absolute 0.1 0 - 0.1 K/uL  Comprehensive metabolic panel  Result Value Ref Range   Sodium 140 135 - 145 mmol/L   Potassium 3.7 3.5 - 5.1 mmol/L   Chloride 105 101 - 111 mmol/L   CO2 26 22 - 32 mmol/L   Glucose, Bld 119 (H) 65 - 99 mg/dL   BUN 13 6 - 20 mg/dL   Creatinine, Ser 0.80 0.44 - 1.00 mg/dL   Calcium 9.6 8.9 - 10.3 mg/dL   Total Protein 7.4 6.5 - 8.1 g/dL   Albumin 4.6 3.5 - 5.0 g/dL   AST 25 15 - 41 U/L   ALT 15 14 - 54 U/L   Alkaline Phosphatase 61 38 - 126 U/L   Total Bilirubin 0.8 0.3 - 1.2 mg/dL   GFR calc non Af Amer >60 >60 mL/min   GFR calc Af Amer >60 >60 mL/min   Anion gap 9 5 - 15  Lipase, blood  Result Value Ref Range   Lipase 18 11 - 51 U/L  Protime-INR  Result Value Ref Range   Prothrombin Time 13.5 11.4 - 15.0 seconds   INR 1.01   APTT  Result Value Ref Range   aPTT 30 24 - 36 seconds  Type and screen [facility]  Result Value Ref Range   ABO/RH(D) A POS    Antibody Screen NEG    Sample Expiration 03/04/2016       Assessment & Plan:   Problem List Items Addressed This Visit      Unprioritized   Hypertension    Check CMP      Relevant Orders   Comprehensive metabolic panel   Lipid Panel w/o Chol/HDL Ratio   Hypothyroid    Check TSH      Relevant Orders   TSH   Low back pain    Discussed walking and OTC      New onset a-fib (HCC)    Rate controlled.  Probable too high risk for anti-coagulant therapy.  Refer to cardiology      Relevant Orders   Ambulatory referral to Cardiology   SOB (shortness of breath)   Relevant Orders   CBC with Differential/Platelet   Weakness   Relevant Orders   CBC with Differential/Platelet    Other Visit Diagnoses    Annual physical exam    -  Primary   Irregular heart rate  Relevant Orders   EKG 12-Lead (Completed)         Follow up plan: Return in  about 6 months (around 01/16/2017).

## 2016-07-19 NOTE — Assessment & Plan Note (Signed)
Discussed walking and OTC

## 2016-07-19 NOTE — Assessment & Plan Note (Addendum)
Rate controlled.  Probable too high risk for anti-coagulant therapy.  Refer to cardiology

## 2016-07-19 NOTE — Assessment & Plan Note (Signed)
Check CMP.  ?

## 2016-07-19 NOTE — Assessment & Plan Note (Signed)
Check TSH 

## 2016-07-20 ENCOUNTER — Encounter: Payer: Self-pay | Admitting: Unknown Physician Specialty

## 2016-07-20 DIAGNOSIS — R0602 Shortness of breath: Secondary | ICD-10-CM | POA: Diagnosis not present

## 2016-07-20 DIAGNOSIS — I4891 Unspecified atrial fibrillation: Secondary | ICD-10-CM | POA: Diagnosis not present

## 2016-07-20 DIAGNOSIS — R600 Localized edema: Secondary | ICD-10-CM | POA: Diagnosis not present

## 2016-07-20 DIAGNOSIS — R011 Cardiac murmur, unspecified: Secondary | ICD-10-CM | POA: Diagnosis not present

## 2016-07-20 DIAGNOSIS — E079 Disorder of thyroid, unspecified: Secondary | ICD-10-CM | POA: Diagnosis not present

## 2016-07-20 DIAGNOSIS — I10 Essential (primary) hypertension: Secondary | ICD-10-CM | POA: Diagnosis not present

## 2016-07-20 DIAGNOSIS — K219 Gastro-esophageal reflux disease without esophagitis: Secondary | ICD-10-CM | POA: Diagnosis not present

## 2016-07-20 LAB — COMPREHENSIVE METABOLIC PANEL
A/G RATIO: 1.6 (ref 1.2–2.2)
ALT: 14 IU/L (ref 0–32)
AST: 28 IU/L (ref 0–40)
Albumin: 4.5 g/dL (ref 3.2–4.6)
Alkaline Phosphatase: 67 IU/L (ref 39–117)
BUN/Creatinine Ratio: 17 (ref 12–28)
BUN: 14 mg/dL (ref 10–36)
Bilirubin Total: 0.7 mg/dL (ref 0.0–1.2)
CALCIUM: 9.4 mg/dL (ref 8.7–10.3)
CO2: 26 mmol/L (ref 18–29)
CREATININE: 0.84 mg/dL (ref 0.57–1.00)
Chloride: 100 mmol/L (ref 96–106)
GFR, EST AFRICAN AMERICAN: 70 mL/min/{1.73_m2} (ref >59)
GFR, EST NON AFRICAN AMERICAN: 61 mL/min/{1.73_m2} (ref >59)
GLUCOSE: 110 mg/dL — AB (ref 65–99)
Globulin, Total: 2.8 g/dL (ref 1.5–4.5)
Potassium: 4.2 mmol/L (ref 3.5–5.2)
Sodium: 144 mmol/L (ref 134–144)
TOTAL PROTEIN: 7.3 g/dL (ref 6.0–8.5)

## 2016-07-20 LAB — LIPID PANEL W/O CHOL/HDL RATIO
Cholesterol, Total: 178 mg/dL (ref 100–199)
HDL: 49 mg/dL (ref >39)
LDL CALC: 106 mg/dL — AB (ref 0–99)
Triglycerides: 113 mg/dL (ref 0–149)
VLDL CHOLESTEROL CAL: 23 mg/dL (ref 5–40)

## 2016-07-20 LAB — CBC WITH DIFFERENTIAL/PLATELET
Basophils Absolute: 0.1 10*3/uL (ref 0.0–0.2)
Basos: 1 %
EOS (ABSOLUTE): 0.1 10*3/uL (ref 0.0–0.4)
Eos: 1 %
Hematocrit: 40.6 % (ref 34.0–46.6)
Hemoglobin: 14.2 g/dL (ref 11.1–15.9)
IMMATURE GRANULOCYTES: 0 %
Immature Grans (Abs): 0 10*3/uL (ref 0.0–0.1)
LYMPHS ABS: 2.3 10*3/uL (ref 0.7–3.1)
Lymphs: 34 %
MCH: 32.9 pg (ref 26.6–33.0)
MCHC: 35 g/dL (ref 31.5–35.7)
MCV: 94 fL (ref 79–97)
Monocytes Absolute: 0.5 10*3/uL (ref 0.1–0.9)
Monocytes: 8 %
NEUTROS PCT: 56 %
Neutrophils Absolute: 3.9 10*3/uL (ref 1.4–7.0)
PLATELETS: 164 10*3/uL (ref 150–379)
RBC: 4.32 x10E6/uL (ref 3.77–5.28)
RDW: 14.8 % (ref 12.3–15.4)
WBC: 6.8 10*3/uL (ref 3.4–10.8)

## 2016-07-20 LAB — TSH: TSH: 2.86 u[IU]/mL (ref 0.450–4.500)

## 2016-07-20 NOTE — Progress Notes (Signed)
Normal labs.  Patient notified by letter.

## 2016-08-11 DIAGNOSIS — R011 Cardiac murmur, unspecified: Secondary | ICD-10-CM | POA: Diagnosis not present

## 2016-08-15 DIAGNOSIS — E669 Obesity, unspecified: Secondary | ICD-10-CM | POA: Diagnosis not present

## 2016-08-15 DIAGNOSIS — R011 Cardiac murmur, unspecified: Secondary | ICD-10-CM | POA: Diagnosis not present

## 2016-08-15 DIAGNOSIS — I10 Essential (primary) hypertension: Secondary | ICD-10-CM | POA: Diagnosis not present

## 2016-08-15 DIAGNOSIS — R600 Localized edema: Secondary | ICD-10-CM | POA: Diagnosis not present

## 2016-08-15 DIAGNOSIS — R0602 Shortness of breath: Secondary | ICD-10-CM | POA: Diagnosis not present

## 2016-08-15 DIAGNOSIS — I272 Pulmonary hypertension, unspecified: Secondary | ICD-10-CM | POA: Diagnosis not present

## 2016-08-15 DIAGNOSIS — I4891 Unspecified atrial fibrillation: Secondary | ICD-10-CM | POA: Diagnosis not present

## 2016-08-15 DIAGNOSIS — E079 Disorder of thyroid, unspecified: Secondary | ICD-10-CM | POA: Diagnosis not present

## 2016-08-15 DIAGNOSIS — K219 Gastro-esophageal reflux disease without esophagitis: Secondary | ICD-10-CM | POA: Diagnosis not present

## 2016-09-25 ENCOUNTER — Other Ambulatory Visit: Payer: Self-pay | Admitting: Unknown Physician Specialty

## 2016-10-03 DIAGNOSIS — R05 Cough: Secondary | ICD-10-CM | POA: Diagnosis not present

## 2016-10-10 ENCOUNTER — Encounter: Payer: Self-pay | Admitting: Unknown Physician Specialty

## 2016-10-10 ENCOUNTER — Ambulatory Visit (INDEPENDENT_AMBULATORY_CARE_PROVIDER_SITE_OTHER): Payer: PPO | Admitting: Unknown Physician Specialty

## 2016-10-10 VITALS — BP 135/78 | HR 82 | Temp 97.4°F | Wt 196.4 lb

## 2016-10-10 DIAGNOSIS — R059 Cough, unspecified: Secondary | ICD-10-CM

## 2016-10-10 DIAGNOSIS — R05 Cough: Secondary | ICD-10-CM

## 2016-10-10 MED ORDER — AZITHROMYCIN 250 MG PO TABS: ORAL_TABLET | ORAL | 0 refills | Status: DC

## 2016-10-10 MED ORDER — BENZONATATE 200 MG PO CAPS: 200.0000 mg | ORAL_CAPSULE | Freq: Three times a day (TID) | ORAL | 0 refills | Status: DC | PRN

## 2016-10-10 NOTE — Progress Notes (Signed)
BP 135/78 (BP Location: Left Arm, Patient Position: Sitting, Cuff Size: Large)   Pulse 82   Temp 97.4 F (36.3 C)   Wt 196 lb 6.4 oz (89.1 kg)   LMP  (LMP Unknown)   SpO2 97%   BMI 31.22 kg/m    Subjective:    Patient ID: Mckenzie Gutierrez, female    DOB: Jun 11, 1922, 83 y.o.   MRN: [id number]  HPI: Mckenzie Gutierrez is a 83 y.o. female  Chief Complaint  Patient presents with  . Follow-up    pt states she is here to f/up on brochitis, states she finished amoxicillin last night and still has some tessalon pearls. States she still occasionally taking mucionex and still coughing up a little bit of phlegm   Bronchitis Patient presents to clinic for a follow-up on a productive cough x 3 weeks. Patient states she visited walk-in clinic a week ago, where she was diagnosed with bronchitis and prescribed amoxicillin and tessalon capsules. She was also advised to begin taking OTC mucinex. Reports that she has been taking all of these and finished her amoxicillin last night with only very slow improvement. States the tessalon capsules provide some relief and feels the mucinex might be helping slightly. She is still experiencing coughing fits, which are producing a white/clear sputum. Denies fever, chills, congestion, sinus pressure, sore throat, nausea, vomiting or diarrhea. Admits to some fatigue, rhinorrhea and sneezing. She also notes that her hemorrhoids are acting up due to the coughing.   Relevant past medical, surgical, family and social history reviewed and updated as indicated. Interim medical history since our last visit reviewed. Allergies and medications reviewed and updated.  Review of Systems  Constitutional: Positive for fatigue. Negative for chills and fever.  HENT: Positive for rhinorrhea and sneezing. Negative for congestion, ear pain, facial swelling, postnasal drip, sinus pain, sinus pressure and sore throat.   Eyes: Negative for pain, itching and visual disturbance.    Respiratory: Positive for cough. Negative for chest tightness, shortness of breath and wheezing.   Cardiovascular: Negative for chest pain.  Gastrointestinal: Positive for rectal pain. Negative for abdominal pain, constipation, diarrhea, nausea and vomiting.       Hemorrhoids acting up due to coughing.  Musculoskeletal: Negative for arthralgias and myalgias.  Neurological: Negative for dizziness, light-headedness and headaches.    Per HPI unless specifically indicated above     Objective:    BP 135/78 (BP Location: Left Arm, Patient Position: Sitting, Cuff Size: Large)   Pulse 82   Temp 97.4 F (36.3 C)   Wt 196 lb 6.4 oz (89.1 kg)   LMP  (LMP Unknown)   SpO2 97%   BMI 31.22 kg/m   Wt Readings from Last 3 Encounters:  10/10/16 196 lb 6.4 oz (89.1 kg)  07/19/16 201 lb (91.2 kg)  03/14/16 201 lb (91.2 kg)    Physical Exam  Constitutional: She is oriented to person, place, and time. She appears well-developed and well-nourished.  HENT:  Head: Normocephalic and atraumatic.  Nose: Nose normal.  Mouth/Throat: Oropharynx is clear and moist.  Eyes: Conjunctivae are normal. Right eye exhibits no discharge. Left eye exhibits no discharge. No scleral icterus.  Neck: Normal range of motion. Neck supple.  Cardiovascular: Normal rate, regular rhythm and normal heart sounds.   Pulmonary/Chest: Effort normal and breath sounds normal.  Neurological: She is alert and oriented to person, place, and time.  Skin: Skin is warm and dry.  Psychiatric: She has a normal mood and  affect. Her behavior is normal. Judgment and thought content normal.  Nursing note and vitals reviewed.     Assessment & Plan:   Problem List Items Addressed This Visit    None    Visit Diagnoses    Cough    -  Primary   Refilled tessalon capsules and gave prescription for azithromycin. Advised patient to fill if cough gets worse or does not improve within the next 2 days.       Follow up plan: Return if  symptoms worsen or fail to improve.

## 2016-10-30 DIAGNOSIS — H6980 Other specified disorders of Eustachian tube, unspecified ear: Secondary | ICD-10-CM | POA: Diagnosis not present

## 2016-10-30 DIAGNOSIS — H903 Sensorineural hearing loss, bilateral: Secondary | ICD-10-CM | POA: Diagnosis not present

## 2016-10-30 DIAGNOSIS — H6123 Impacted cerumen, bilateral: Secondary | ICD-10-CM | POA: Diagnosis not present

## 2016-11-06 ENCOUNTER — Telehealth: Payer: Self-pay | Admitting: Unknown Physician Specialty

## 2016-11-06 NOTE — Telephone Encounter (Signed)
Patient has a question regarding the $10 bill she received. She said she has paid her co-pay each visit and does not understand the reason for this bill.  Thanks

## 2016-11-15 NOTE — Telephone Encounter (Signed)
Question was answered.

## 2016-12-05 DIAGNOSIS — M9905 Segmental and somatic dysfunction of pelvic region: Secondary | ICD-10-CM | POA: Diagnosis not present

## 2016-12-05 DIAGNOSIS — M9903 Segmental and somatic dysfunction of lumbar region: Secondary | ICD-10-CM | POA: Diagnosis not present

## 2016-12-05 DIAGNOSIS — M5136 Other intervertebral disc degeneration, lumbar region: Secondary | ICD-10-CM | POA: Diagnosis not present

## 2016-12-05 DIAGNOSIS — M5431 Sciatica, right side: Secondary | ICD-10-CM | POA: Diagnosis not present

## 2016-12-07 DIAGNOSIS — M5431 Sciatica, right side: Secondary | ICD-10-CM | POA: Diagnosis not present

## 2016-12-07 DIAGNOSIS — M9903 Segmental and somatic dysfunction of lumbar region: Secondary | ICD-10-CM | POA: Diagnosis not present

## 2016-12-07 DIAGNOSIS — M5136 Other intervertebral disc degeneration, lumbar region: Secondary | ICD-10-CM | POA: Diagnosis not present

## 2016-12-07 DIAGNOSIS — M9905 Segmental and somatic dysfunction of pelvic region: Secondary | ICD-10-CM | POA: Diagnosis not present

## 2016-12-09 DIAGNOSIS — M9905 Segmental and somatic dysfunction of pelvic region: Secondary | ICD-10-CM | POA: Diagnosis not present

## 2016-12-09 DIAGNOSIS — M5136 Other intervertebral disc degeneration, lumbar region: Secondary | ICD-10-CM | POA: Diagnosis not present

## 2016-12-09 DIAGNOSIS — M9903 Segmental and somatic dysfunction of lumbar region: Secondary | ICD-10-CM | POA: Diagnosis not present

## 2016-12-09 DIAGNOSIS — M5431 Sciatica, right side: Secondary | ICD-10-CM | POA: Diagnosis not present

## 2016-12-11 DIAGNOSIS — M5136 Other intervertebral disc degeneration, lumbar region: Secondary | ICD-10-CM | POA: Diagnosis not present

## 2016-12-11 DIAGNOSIS — M5431 Sciatica, right side: Secondary | ICD-10-CM | POA: Diagnosis not present

## 2016-12-11 DIAGNOSIS — M9903 Segmental and somatic dysfunction of lumbar region: Secondary | ICD-10-CM | POA: Diagnosis not present

## 2016-12-11 DIAGNOSIS — M9905 Segmental and somatic dysfunction of pelvic region: Secondary | ICD-10-CM | POA: Diagnosis not present

## 2016-12-13 DIAGNOSIS — M5431 Sciatica, right side: Secondary | ICD-10-CM | POA: Diagnosis not present

## 2016-12-13 DIAGNOSIS — M5136 Other intervertebral disc degeneration, lumbar region: Secondary | ICD-10-CM | POA: Diagnosis not present

## 2016-12-13 DIAGNOSIS — M9905 Segmental and somatic dysfunction of pelvic region: Secondary | ICD-10-CM | POA: Diagnosis not present

## 2016-12-13 DIAGNOSIS — M9903 Segmental and somatic dysfunction of lumbar region: Secondary | ICD-10-CM | POA: Diagnosis not present

## 2016-12-14 DIAGNOSIS — M9905 Segmental and somatic dysfunction of pelvic region: Secondary | ICD-10-CM | POA: Diagnosis not present

## 2016-12-14 DIAGNOSIS — M5431 Sciatica, right side: Secondary | ICD-10-CM | POA: Diagnosis not present

## 2016-12-14 DIAGNOSIS — M9903 Segmental and somatic dysfunction of lumbar region: Secondary | ICD-10-CM | POA: Diagnosis not present

## 2016-12-14 DIAGNOSIS — M5136 Other intervertebral disc degeneration, lumbar region: Secondary | ICD-10-CM | POA: Diagnosis not present

## 2016-12-18 DIAGNOSIS — M5136 Other intervertebral disc degeneration, lumbar region: Secondary | ICD-10-CM | POA: Diagnosis not present

## 2016-12-18 DIAGNOSIS — M5431 Sciatica, right side: Secondary | ICD-10-CM | POA: Diagnosis not present

## 2016-12-18 DIAGNOSIS — M9903 Segmental and somatic dysfunction of lumbar region: Secondary | ICD-10-CM | POA: Diagnosis not present

## 2016-12-18 DIAGNOSIS — M9905 Segmental and somatic dysfunction of pelvic region: Secondary | ICD-10-CM | POA: Diagnosis not present

## 2016-12-20 DIAGNOSIS — M5136 Other intervertebral disc degeneration, lumbar region: Secondary | ICD-10-CM | POA: Diagnosis not present

## 2016-12-20 DIAGNOSIS — M5431 Sciatica, right side: Secondary | ICD-10-CM | POA: Diagnosis not present

## 2016-12-20 DIAGNOSIS — M9903 Segmental and somatic dysfunction of lumbar region: Secondary | ICD-10-CM | POA: Diagnosis not present

## 2016-12-20 DIAGNOSIS — M9905 Segmental and somatic dysfunction of pelvic region: Secondary | ICD-10-CM | POA: Diagnosis not present

## 2016-12-21 DIAGNOSIS — M9905 Segmental and somatic dysfunction of pelvic region: Secondary | ICD-10-CM | POA: Diagnosis not present

## 2016-12-21 DIAGNOSIS — M5136 Other intervertebral disc degeneration, lumbar region: Secondary | ICD-10-CM | POA: Diagnosis not present

## 2016-12-21 DIAGNOSIS — M9903 Segmental and somatic dysfunction of lumbar region: Secondary | ICD-10-CM | POA: Diagnosis not present

## 2016-12-21 DIAGNOSIS — M5431 Sciatica, right side: Secondary | ICD-10-CM | POA: Diagnosis not present

## 2016-12-25 DIAGNOSIS — M5136 Other intervertebral disc degeneration, lumbar region: Secondary | ICD-10-CM | POA: Diagnosis not present

## 2016-12-25 DIAGNOSIS — M9905 Segmental and somatic dysfunction of pelvic region: Secondary | ICD-10-CM | POA: Diagnosis not present

## 2016-12-25 DIAGNOSIS — M5431 Sciatica, right side: Secondary | ICD-10-CM | POA: Diagnosis not present

## 2016-12-25 DIAGNOSIS — M9903 Segmental and somatic dysfunction of lumbar region: Secondary | ICD-10-CM | POA: Diagnosis not present

## 2016-12-28 DIAGNOSIS — M5136 Other intervertebral disc degeneration, lumbar region: Secondary | ICD-10-CM | POA: Diagnosis not present

## 2016-12-28 DIAGNOSIS — M9905 Segmental and somatic dysfunction of pelvic region: Secondary | ICD-10-CM | POA: Diagnosis not present

## 2016-12-28 DIAGNOSIS — M9903 Segmental and somatic dysfunction of lumbar region: Secondary | ICD-10-CM | POA: Diagnosis not present

## 2016-12-28 DIAGNOSIS — M5431 Sciatica, right side: Secondary | ICD-10-CM | POA: Diagnosis not present

## 2017-01-01 DIAGNOSIS — M5136 Other intervertebral disc degeneration, lumbar region: Secondary | ICD-10-CM | POA: Diagnosis not present

## 2017-01-01 DIAGNOSIS — M5431 Sciatica, right side: Secondary | ICD-10-CM | POA: Diagnosis not present

## 2017-01-01 DIAGNOSIS — M9905 Segmental and somatic dysfunction of pelvic region: Secondary | ICD-10-CM | POA: Diagnosis not present

## 2017-01-01 DIAGNOSIS — M9903 Segmental and somatic dysfunction of lumbar region: Secondary | ICD-10-CM | POA: Diagnosis not present

## 2017-01-04 DIAGNOSIS — M5431 Sciatica, right side: Secondary | ICD-10-CM | POA: Diagnosis not present

## 2017-01-04 DIAGNOSIS — M9903 Segmental and somatic dysfunction of lumbar region: Secondary | ICD-10-CM | POA: Diagnosis not present

## 2017-01-04 DIAGNOSIS — M9905 Segmental and somatic dysfunction of pelvic region: Secondary | ICD-10-CM | POA: Diagnosis not present

## 2017-01-04 DIAGNOSIS — M5136 Other intervertebral disc degeneration, lumbar region: Secondary | ICD-10-CM | POA: Diagnosis not present

## 2017-01-08 DIAGNOSIS — M9903 Segmental and somatic dysfunction of lumbar region: Secondary | ICD-10-CM | POA: Diagnosis not present

## 2017-01-08 DIAGNOSIS — M9905 Segmental and somatic dysfunction of pelvic region: Secondary | ICD-10-CM | POA: Diagnosis not present

## 2017-01-08 DIAGNOSIS — M5136 Other intervertebral disc degeneration, lumbar region: Secondary | ICD-10-CM | POA: Diagnosis not present

## 2017-01-08 DIAGNOSIS — M5431 Sciatica, right side: Secondary | ICD-10-CM | POA: Diagnosis not present

## 2017-01-11 DIAGNOSIS — M5431 Sciatica, right side: Secondary | ICD-10-CM | POA: Diagnosis not present

## 2017-01-11 DIAGNOSIS — M9905 Segmental and somatic dysfunction of pelvic region: Secondary | ICD-10-CM | POA: Diagnosis not present

## 2017-01-11 DIAGNOSIS — M5136 Other intervertebral disc degeneration, lumbar region: Secondary | ICD-10-CM | POA: Diagnosis not present

## 2017-01-11 DIAGNOSIS — M9903 Segmental and somatic dysfunction of lumbar region: Secondary | ICD-10-CM | POA: Diagnosis not present

## 2017-01-15 DIAGNOSIS — H353131 Nonexudative age-related macular degeneration, bilateral, early dry stage: Secondary | ICD-10-CM | POA: Diagnosis not present

## 2017-01-16 ENCOUNTER — Ambulatory Visit: Payer: PPO | Admitting: Unknown Physician Specialty

## 2017-01-16 DIAGNOSIS — M5136 Other intervertebral disc degeneration, lumbar region: Secondary | ICD-10-CM | POA: Diagnosis not present

## 2017-01-16 DIAGNOSIS — M5431 Sciatica, right side: Secondary | ICD-10-CM | POA: Diagnosis not present

## 2017-01-16 DIAGNOSIS — M9903 Segmental and somatic dysfunction of lumbar region: Secondary | ICD-10-CM | POA: Diagnosis not present

## 2017-01-16 DIAGNOSIS — M9905 Segmental and somatic dysfunction of pelvic region: Secondary | ICD-10-CM | POA: Diagnosis not present

## 2017-01-30 DIAGNOSIS — M5431 Sciatica, right side: Secondary | ICD-10-CM | POA: Diagnosis not present

## 2017-01-30 DIAGNOSIS — M5136 Other intervertebral disc degeneration, lumbar region: Secondary | ICD-10-CM | POA: Diagnosis not present

## 2017-01-30 DIAGNOSIS — M9903 Segmental and somatic dysfunction of lumbar region: Secondary | ICD-10-CM | POA: Diagnosis not present

## 2017-01-30 DIAGNOSIS — M9905 Segmental and somatic dysfunction of pelvic region: Secondary | ICD-10-CM | POA: Diagnosis not present

## 2017-02-02 ENCOUNTER — Ambulatory Visit: Payer: Self-pay | Admitting: Unknown Physician Specialty

## 2017-02-06 ENCOUNTER — Ambulatory Visit (INDEPENDENT_AMBULATORY_CARE_PROVIDER_SITE_OTHER): Payer: PPO | Admitting: Unknown Physician Specialty

## 2017-02-06 ENCOUNTER — Encounter: Payer: Self-pay | Admitting: Unknown Physician Specialty

## 2017-02-06 DIAGNOSIS — F329 Major depressive disorder, single episode, unspecified: Secondary | ICD-10-CM | POA: Diagnosis not present

## 2017-02-06 DIAGNOSIS — E039 Hypothyroidism, unspecified: Secondary | ICD-10-CM | POA: Diagnosis not present

## 2017-02-06 DIAGNOSIS — F419 Anxiety disorder, unspecified: Secondary | ICD-10-CM

## 2017-02-06 DIAGNOSIS — I10 Essential (primary) hypertension: Secondary | ICD-10-CM | POA: Diagnosis not present

## 2017-02-06 DIAGNOSIS — N3941 Urge incontinence: Secondary | ICD-10-CM | POA: Diagnosis not present

## 2017-02-06 DIAGNOSIS — F32A Depression, unspecified: Secondary | ICD-10-CM

## 2017-02-06 NOTE — Assessment & Plan Note (Addendum)
Not to goal.  She would like to come off her Citalopram but encouraged to stay on it today as she is discouraged at this time.  Encouraged to increase from 1/2 pill to a whole pill.  She is willing.

## 2017-02-06 NOTE — Progress Notes (Signed)
BP 134/81   Pulse 93   Temp 98.3 F (36.8 C)   Ht 5\' 6"  (1.676 m)   Wt 201 lb (91.2 kg)   LMP  (LMP Unknown)   SpO2 95%   BMI 32.44 kg/m    Subjective:    Patient ID: Mckenzie Gutierrez, female    DOB: 1922/03/06, 83 y.o.   MRN: [id number]  HPI: Mckenzie Gutierrez is a 83 y.o. female  Chief Complaint  Patient presents with  . Depression  . Hypertension  . Hypothyroidism   Hypertension Using medications without difficulty Average home BPs   No problems or lightheadedness No chest pain with exertion or shortness of breath No Edema   Depression Feeling older and weak Depression screen [facility] 2/9 02/06/2017 07/19/2016 07/14/2015  Decreased Interest 0 1 0  Down, Depressed, Hopeless 0 1 0  PHQ - 2 Score 0 2 0  Altered sleeping 1 0 -  Tired, decreased energy 3 3 -  Change in appetite 3 0 -  Feeling bad or failure about yourself  0 1 -  Trouble concentrating 0 0 -  Moving slowly or fidgety/restless 0 0 -  Suicidal thoughts 0 0 -  PHQ-9 Score 7 6 -   Noctutia Getting up multiple times at night which is bothering her.  This is her primary concern today.    Hypothyroid No weight changes.  She is low on energy   Relevant past medical, surgical, family and social history reviewed and updated as indicated. Interim medical history since our last visit reviewed. Allergies and medications reviewed and updated.  Review of Systems  Constitutional: Positive for fatigue.  HENT: Negative.   Eyes: Negative.   Respiratory: Negative.   Cardiovascular: Negative.   Gastrointestinal: Positive for anal bleeding.       Long term hemorrhoids  Genitourinary:       Frequency  Musculoskeletal: Negative.     Per HPI unless specifically indicated above     Objective:    BP 134/81   Pulse 93   Temp 98.3 F (36.8 C)   Ht 5\' 6"  (1.676 m)   Wt 201 lb (91.2 kg)   LMP  (LMP Unknown)   SpO2 95%   BMI 32.44 kg/m   Wt Readings from Last 3 Encounters:  02/06/17 201 lb (91.2 kg)    10/10/16 196 lb 6.4 oz (89.1 kg)  07/19/16 201 lb (91.2 kg)    Physical Exam  Constitutional: She is oriented to person, place, and time. She appears well-developed and well-nourished. No distress.  HENT:  Head: Normocephalic and atraumatic.  Eyes: Conjunctivae and lids are normal. Right eye exhibits no discharge. Left eye exhibits no discharge. No scleral icterus.  Neck: Normal range of motion. Neck supple. No JVD present. Carotid bruit is not present.  Cardiovascular: Normal rate, regular rhythm and normal heart sounds.   Pulmonary/Chest: Effort normal and breath sounds normal.  Abdominal: Normal appearance. There is no splenomegaly or hepatomegaly.  Musculoskeletal: Normal range of motion.  Neurological: She is alert and oriented to person, place, and time.  Skin: Skin is warm, dry and intact. No rash noted. No pallor.  Psychiatric: She has a normal mood and affect. Her behavior is normal. Judgment and thought content normal.       Assessment & Plan:   Problem List Items Addressed This Visit      Unprioritized   Anxiety and depression    Not to goal.  She would like to come off  her Citalopram but encouraged to stay on it today as she is discouraged at this time.  Encouraged to increase from 1/2 pill to a whole pill.  She is willing.        Hypertension    Stable, continue present medications.        Hypothyroid    Euthyroid 6 months ago      Urge incontinence of urine    With nocturia.  Discussed medication.  Pt is not interested at this time          Follow up plan: No Follow-up on file.

## 2017-02-06 NOTE — Assessment & Plan Note (Signed)
Stable, continue present medications.   

## 2017-02-06 NOTE — Assessment & Plan Note (Signed)
Euthyroid 6 months ago

## 2017-02-06 NOTE — Assessment & Plan Note (Signed)
With nocturia.  Discussed medication.  Pt is not interested at this time

## 2017-02-13 ENCOUNTER — Encounter (INDEPENDENT_AMBULATORY_CARE_PROVIDER_SITE_OTHER): Payer: Self-pay

## 2017-02-13 ENCOUNTER — Ambulatory Visit (INDEPENDENT_AMBULATORY_CARE_PROVIDER_SITE_OTHER): Payer: Self-pay | Admitting: Vascular Surgery

## 2017-02-15 DIAGNOSIS — I10 Essential (primary) hypertension: Secondary | ICD-10-CM | POA: Diagnosis not present

## 2017-02-15 DIAGNOSIS — R600 Localized edema: Secondary | ICD-10-CM | POA: Diagnosis not present

## 2017-02-15 DIAGNOSIS — I4891 Unspecified atrial fibrillation: Secondary | ICD-10-CM | POA: Diagnosis not present

## 2017-02-15 DIAGNOSIS — K219 Gastro-esophageal reflux disease without esophagitis: Secondary | ICD-10-CM | POA: Diagnosis not present

## 2017-02-15 DIAGNOSIS — R0602 Shortness of breath: Secondary | ICD-10-CM | POA: Diagnosis not present

## 2017-02-15 DIAGNOSIS — E782 Mixed hyperlipidemia: Secondary | ICD-10-CM | POA: Diagnosis not present

## 2017-02-15 DIAGNOSIS — R011 Cardiac murmur, unspecified: Secondary | ICD-10-CM | POA: Diagnosis not present

## 2017-02-15 DIAGNOSIS — I272 Pulmonary hypertension, unspecified: Secondary | ICD-10-CM | POA: Diagnosis not present

## 2017-02-15 DIAGNOSIS — E669 Obesity, unspecified: Secondary | ICD-10-CM | POA: Diagnosis not present

## 2017-02-15 DIAGNOSIS — E079 Disorder of thyroid, unspecified: Secondary | ICD-10-CM | POA: Diagnosis not present

## 2017-02-15 DIAGNOSIS — I739 Peripheral vascular disease, unspecified: Secondary | ICD-10-CM | POA: Diagnosis not present

## 2017-02-19 ENCOUNTER — Other Ambulatory Visit (INDEPENDENT_AMBULATORY_CARE_PROVIDER_SITE_OTHER): Payer: Self-pay | Admitting: Vascular Surgery

## 2017-02-19 DIAGNOSIS — I739 Peripheral vascular disease, unspecified: Principal | ICD-10-CM

## 2017-02-19 DIAGNOSIS — I779 Disorder of arteries and arterioles, unspecified: Secondary | ICD-10-CM

## 2017-02-20 ENCOUNTER — Ambulatory Visit (INDEPENDENT_AMBULATORY_CARE_PROVIDER_SITE_OTHER): Payer: PPO | Admitting: Vascular Surgery

## 2017-02-20 ENCOUNTER — Ambulatory Visit (INDEPENDENT_AMBULATORY_CARE_PROVIDER_SITE_OTHER): Payer: PPO

## 2017-02-20 ENCOUNTER — Encounter (INDEPENDENT_AMBULATORY_CARE_PROVIDER_SITE_OTHER): Payer: Self-pay | Admitting: Vascular Surgery

## 2017-02-20 VITALS — BP 145/63 | HR 59 | Resp 14 | Ht 66.0 in | Wt 203.0 lb

## 2017-02-20 DIAGNOSIS — I10 Essential (primary) hypertension: Secondary | ICD-10-CM

## 2017-02-20 DIAGNOSIS — I779 Disorder of arteries and arterioles, unspecified: Secondary | ICD-10-CM

## 2017-02-20 DIAGNOSIS — I6523 Occlusion and stenosis of bilateral carotid arteries: Secondary | ICD-10-CM

## 2017-02-20 DIAGNOSIS — I4891 Unspecified atrial fibrillation: Secondary | ICD-10-CM | POA: Diagnosis not present

## 2017-02-20 DIAGNOSIS — E785 Hyperlipidemia, unspecified: Secondary | ICD-10-CM | POA: Diagnosis not present

## 2017-02-20 DIAGNOSIS — I739 Peripheral vascular disease, unspecified: Principal | ICD-10-CM

## 2017-02-20 LAB — VAS US CAROTID
LCCADSYS: 82 cm/s
LCCAPSYS: 68 cm/s
LEFT ECA DIAS: -6 cm/s
LEFT VERTEBRAL DIAS: 10 cm/s
LICADSYS: -55 cm/s
Left CCA dist dias: 19 cm/s
Left CCA prox dias: 9 cm/s
Left ICA dist dias: -15 cm/s
Left ICA prox dias: 8 cm/s
Left ICA prox sys: 50 cm/s
RCCADSYS: -76 cm/s
RCCAPDIAS: 11 cm/s
RIGHT CCA MID DIAS: 9 cm/s
RIGHT ECA DIAS: -7 cm/s
RIGHT VERTEBRAL DIAS: 0 cm/s
Right CCA prox sys: 54 cm/s

## 2017-02-20 NOTE — Progress Notes (Signed)
MRN : [id number]  Mckenzie Gutierrez is a 83 y.o. (1922-07-24) female who presents with chief complaint of  Chief Complaint  Patient presents with  . Carotid    1 year carotid u/s  .  History of Present Illness: Patient returns today in follow up of Carotid disease. She is about 7 years status post right carotid endarterectomy. She has been started on a low dose of anticoagulation for new onset atrial fibrillation. She denies any focal neurologic symptoms. Specifically, the patient denies amaurosis fugax, speech or swallowing difficulties, or arm or leg weakness or numbness. Carotid duplex today reveals a widely patent right carotid endarterectomy and stable, mild left carotid artery stenosis in the 1-39% range. This has not progressed to her previous study.  Current Outpatient Prescriptions  Medication Sig Dispense Refill  . amLODipine (NORVASC) 5 MG tablet TAKE 1 TABLET BY MOUTH EVERY DAY 90 tablet 3  . apixaban (ELIQUIS) 2.5 MG TABS tablet Take 2.5 mg by mouth 2 (two) times daily.    . beta carotene w/minerals (OCUVITE) tablet Take 1 tablet by mouth daily.    . Biotin 5000 MCG CAPS Take by mouth daily.    . citalopram (CELEXA) 20 MG tablet TAKE 1 TABLET BY MOUTH EVERY DAY. TAKE 1/2 TABLET BY MOUTH EVERY EVENING AT BEDTIME. 90 tablet 3  . furosemide (LASIX) 20 MG tablet TAKE 1 TABLET BY MOUTH EVERY DAY 90 tablet 3  . hydrocortisone (PROCTOZONE-HC) 2.5 % rectal cream Place 1 application rectally 2 (two) times daily. 30 g 3  . levothyroxine (SYNTHROID, LEVOTHROID) 75 MCG tablet TAKE 1 TABLET BY MOUTH EVERY DAY 90 tablet 3  . losartan (COZAAR) 100 MG tablet TAKE 1 TABLET BY MOUTH EVERY DAY 90 tablet 3  . Multiple Vitamin (MULTIVITAMIN) tablet Take 1 tablet by mouth daily.    . pantoprazole (PROTONIX) 20 MG tablet TAKE 1 TABLET BY MOUTH EVERY DAY 90 tablet 3  . polyethylene glycol (MIRALAX / GLYCOLAX) packet Take 17 g by mouth daily.      No current facility-administered medications for  this visit.     Past Medical History:  Diagnosis Date  . Anxiety   . Atrial premature beats   . Bradycardia   . Carotid stenosis   . Depression   . GERD (gastroesophageal reflux disease)   . Hyperlipidemia   . Hypertension   . Low back pain   . Osteoporosis   . Thyroid disease   . Urinary frequency     Past Surgical History:  Procedure Laterality Date  . CARPAL TUNNEL RELEASE    . WRIST SURGERY      Social History Social History  Substance Use Topics  . Smoking status: Never Smoker  . Smokeless tobacco: Never Used  . Alcohol use No  Widowed  Family History Family History  Problem Relation Age of Onset  . Heart disease Mother   . Stroke Father   . Diabetes Father   . Stroke Sister      Allergies  Allergen Reactions  . Celebrex [Celecoxib] Other (See Comments)    Hard stools  . Codeine Sulfate Other (See Comments)  . Hydrochlorothiazide   . Hytrin [Terazosin]   . Plendil [Felodipine] Diarrhea and Nausea Only  . Nitrofuran Derivatives Swelling and Rash  . Nitrofurantoin Rash and Swelling     REVIEW OF SYSTEMS (Negative unless checked)  Constitutional: [] Weight loss  [] Fever  [] Chills Cardiac: [] Chest pain   [] Chest pressure   [x] Palpitations   [] Shortness of  breath when laying flat   [] Shortness of breath at rest   [] Shortness of breath with exertion. Vascular:  [] Pain in legs with walking   [] Pain in legs at rest   [] Pain in legs when laying flat   [] Claudication   [] Pain in feet when walking  [] Pain in feet at rest  [] Pain in feet when laying flat   [] History of DVT   [] Phlebitis   [] Swelling in legs   [] Varicose veins   [] Non-healing ulcers Pulmonary:   [] Uses home oxygen   [] Productive cough   [] Hemoptysis   [] Wheeze  [] COPD   [] Asthma Neurologic:  [x] Dizziness  [] Blackouts   [] Seizures   [] History of stroke   [] History of TIA  [] Aphasia   [] Temporary blindness   [] Dysphagia   [] Weakness or numbness in arms   [] Weakness or numbness in  legs Musculoskeletal:  [x] Arthritis   [] Joint swelling   [] Joint pain   [] Low back pain Hematologic:  [] Easy bruising  [] Easy bleeding   [] Hypercoagulable state   [] Anemic   Gastrointestinal:  [] Blood in stool   [] Vomiting blood  [] Gastroesophageal reflux/heartburn   [] Abdominal pain Genitourinary:  [] Chronic kidney disease   [] Difficult urination  [] Frequent urination  [] Burning with urination   [] Hematuria Skin:  [] Rashes   [] Ulcers   [] Wounds Psychological:  [x] History of anxiety   []  History of major depression.  Physical Examination  BP (!) 145/63 (BP Location: Right Arm)   Pulse (!) 59   Resp 14   Ht 5\' 6"  (1.676 m)   Wt 203 lb (92.1 kg)   LMP  (LMP Unknown)   BMI 32.77 kg/m  Gen:  WD/WN, NAD Head: [place]/AT, No temporalis wasting. Ear/Nose/Throat: Hearing grossly intact, nares w/o erythema or drainage, trachea midline Eyes: Conjunctiva clear. Sclera non-icteric Neck: Supple.  No JVD.  Pulmonary:  Good air movement, no use of accessory muscles.  Cardiac: Irregular Vascular:  Vessel Right Left  Radial Palpable Palpable                                   Gastrointestinal: soft, non-tender/non-distended.  Musculoskeletal: M/S 5/5 throughout.  No deformity or atrophy.  Neurologic: Sensation grossly intact in extremities.  Symmetrical.  Speech is fluent.  Psychiatric: Judgment intact, Mood & affect appropriate for pt's clinical situation. Dermatologic: No rashes or ulcers noted.  No cellulitis or open wounds.       Labs Recent Results (from the past 2160 hour(s))  VAS US CAROTID     Status: None (In process)   Collection Time: 02/20/17  2:41 PM  Result Value Ref Range   Right CCA prox sys 54 cm/s   Right CCA prox dias 11 cm/s   Right cca dist sys -76 cm/s   Left CCA prox sys 68 cm/s   Left CCA prox dias 9 cm/s   Left CCA dist sys 82 cm/s   Left CCA dist dias 19 cm/s   Left ICA prox sys 50 cm/s   Left ICA prox dias 8 cm/s   Left ICA dist sys -55 cm/s   Left  ICA dist dias -15 cm/s   RIGHT CCA MID DIAS 9.00 cm/s   RIGHT ECA DIAS -7.00 cm/s   RIGHT VERTEBRAL DIAS 0.00 cm/s   LEFT ECA DIAS -6.00 cm/s   LEFT VERTEBRAL DIAS 10.00 cm/s    Radiology No results found.    Assessment/Plan  New onset a-fib [facility]) Now on  low-dose anticoagulation. Follows with cardiology  Hypertension blood pressure control important in reducing the progression of atherosclerotic disease. On appropriate oral medications.   Carotid stenosis Carotid duplex today reveals a widely patent right carotid endarterectomy and stable, mild left carotid artery stenosis in the 1-39% range. This has not progressed to her previous study. Plan to check this on an annual basis. Contact our office with any problems in the interim.    Leotis Pain, MD  02/20/2017 5:24 PM    This note was created with Dragon medical transcription system.  Any errors from dictation are purely unintentional

## 2017-02-20 NOTE — Assessment & Plan Note (Signed)
blood pressure control important in reducing the progression of atherosclerotic disease. On appropriate oral medications.  

## 2017-02-20 NOTE — Assessment & Plan Note (Signed)
Carotid duplex today reveals a widely patent right carotid endarterectomy and stable, mild left carotid artery stenosis in the 1-39% range. This has not progressed to her previous study. Plan to check this on an annual basis. Contact our office with any problems in the interim.

## 2017-02-20 NOTE — Assessment & Plan Note (Signed)
Now on low-dose anticoagulation. Follows with cardiology

## 2017-02-27 DIAGNOSIS — M9903 Segmental and somatic dysfunction of lumbar region: Secondary | ICD-10-CM | POA: Diagnosis not present

## 2017-02-27 DIAGNOSIS — M5136 Other intervertebral disc degeneration, lumbar region: Secondary | ICD-10-CM | POA: Diagnosis not present

## 2017-02-27 DIAGNOSIS — M9905 Segmental and somatic dysfunction of pelvic region: Secondary | ICD-10-CM | POA: Diagnosis not present

## 2017-02-27 DIAGNOSIS — M5431 Sciatica, right side: Secondary | ICD-10-CM | POA: Diagnosis not present

## 2017-03-13 ENCOUNTER — Other Ambulatory Visit: Payer: Self-pay | Admitting: Unknown Physician Specialty

## 2017-03-28 ENCOUNTER — Other Ambulatory Visit: Payer: Self-pay | Admitting: Unknown Physician Specialty

## 2017-03-29 ENCOUNTER — Telehealth: Payer: Self-pay | Admitting: Unknown Physician Specialty

## 2017-03-29 NOTE — Telephone Encounter (Signed)
Medication called in to pharmacy.

## 2017-03-29 NOTE — Telephone Encounter (Signed)
Please call in 1 bottle of miralax, she should take 17g daily

## 2017-03-29 NOTE — Telephone Encounter (Signed)
Dr. Wynetta Emery, can you look in to this since the patient is waiting on it. Cheryl sent it in yesterday but her dispense amount was [id number] with 2 refills. Not sure if this was a mistake.

## 2017-04-24 DIAGNOSIS — M9905 Segmental and somatic dysfunction of pelvic region: Secondary | ICD-10-CM | POA: Diagnosis not present

## 2017-04-24 DIAGNOSIS — M62830 Muscle spasm of back: Secondary | ICD-10-CM | POA: Diagnosis not present

## 2017-04-24 DIAGNOSIS — M5136 Other intervertebral disc degeneration, lumbar region: Secondary | ICD-10-CM | POA: Diagnosis not present

## 2017-04-24 DIAGNOSIS — M9903 Segmental and somatic dysfunction of lumbar region: Secondary | ICD-10-CM | POA: Diagnosis not present

## 2017-05-21 ENCOUNTER — Other Ambulatory Visit: Payer: Self-pay | Admitting: Family Medicine

## 2017-05-21 NOTE — Telephone Encounter (Signed)
Your patient 

## 2017-06-02 IMAGING — CR DG LUMBAR SPINE 2-3V
1 series · 3 of 3 positions shown · non-contrast
Comparison: None.

CLINICAL DATA: Left lower back pain, no known injury

EXAM:
LUMBAR SPINE - 2-3 VIEW

[Series 1: dg lumbar spine 2-3 views · 0.14mm/px · 3 of 3 slices shown]
[im 1/3]
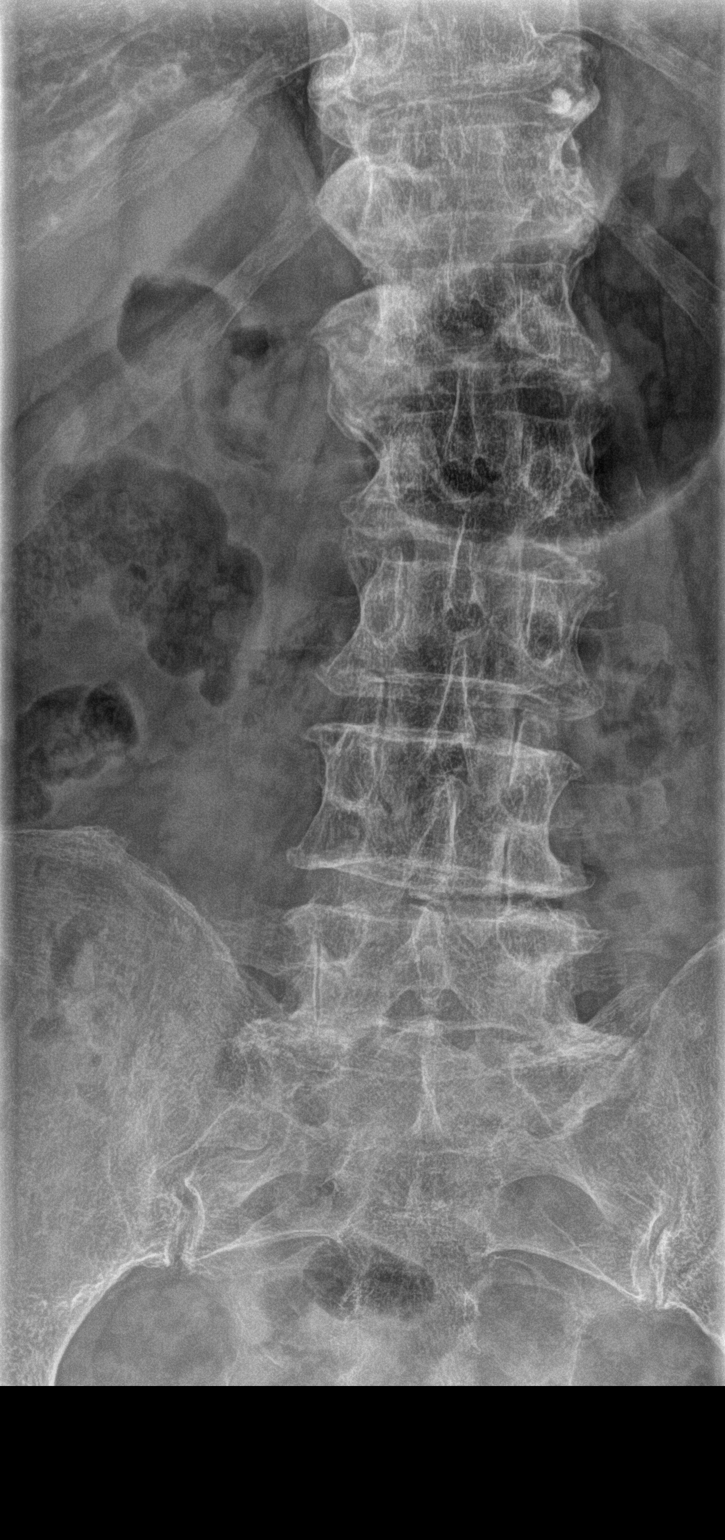
[im 2/3]
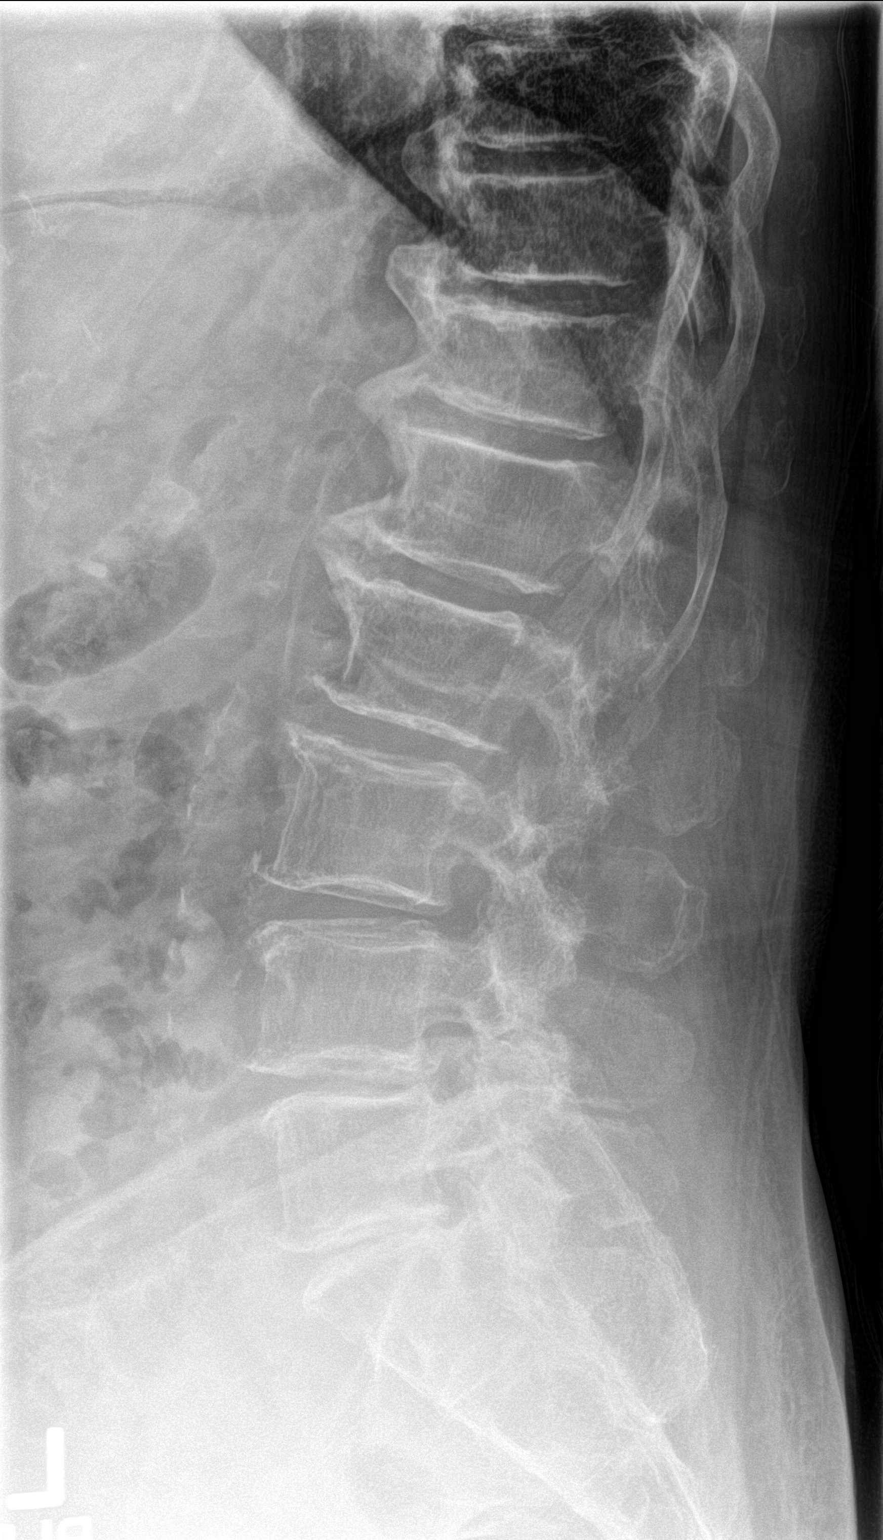
[im 3/3]
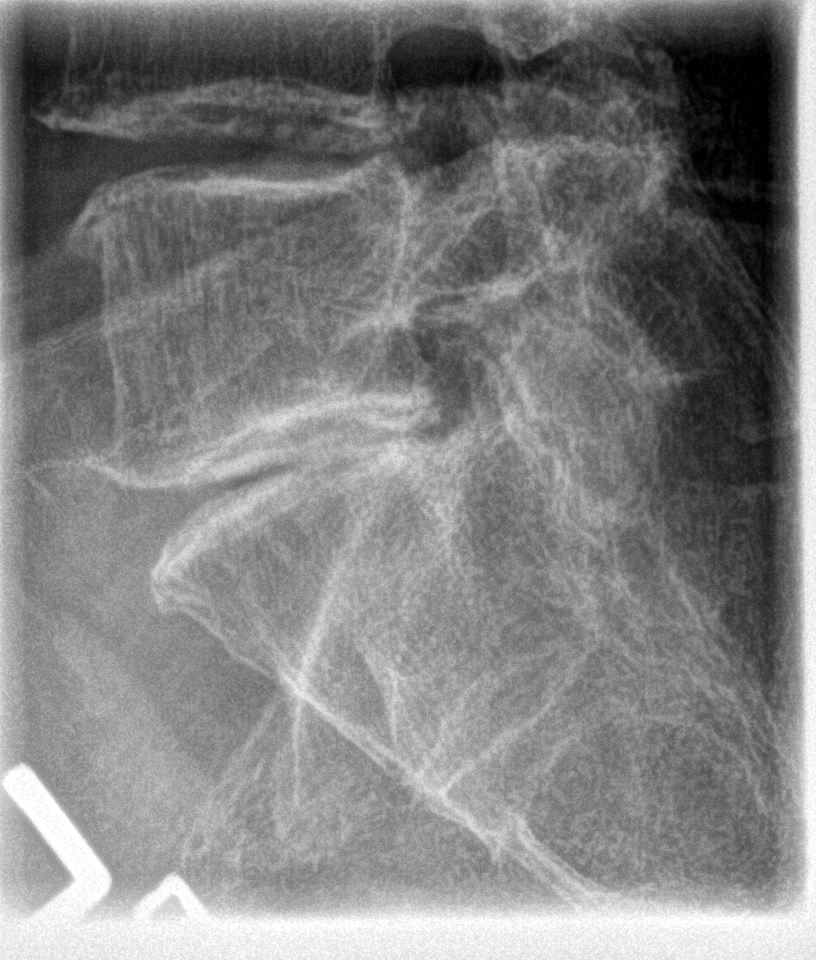

[3 of 3 positions shown; findings below may reference images not displayed]

FINDINGS: Three views of the lumbar spine submitted. No acute fracture or
subluxation. Alignment and vertebral body heights are preserved.
Moderate anterior bridging osteophytes noted at T10 T11-T11 T12 and
T12-L1 level. Moderate anterior osteophytes noted at L1-L2 level.
Moderate and scattered mild anterior osteophytes at L2-L3 and L3-L4
level. There is mild to moderate disc space flattening at L4-L5
level. There is moderate disc space flattening at L5-S1 level. Facet
degenerative changes at L4 and L5 level. Mild atherosclerotic
calcifications of abdominal aorta.
IMPRESSION: No acute fracture or subluxation. Degenerative changes as described
above.

## 2017-06-11 ENCOUNTER — Other Ambulatory Visit: Payer: Self-pay | Admitting: Unknown Physician Specialty

## 2017-06-19 DIAGNOSIS — M9903 Segmental and somatic dysfunction of lumbar region: Secondary | ICD-10-CM | POA: Diagnosis not present

## 2017-06-19 DIAGNOSIS — M62830 Muscle spasm of back: Secondary | ICD-10-CM | POA: Diagnosis not present

## 2017-06-19 DIAGNOSIS — M9905 Segmental and somatic dysfunction of pelvic region: Secondary | ICD-10-CM | POA: Diagnosis not present

## 2017-06-19 DIAGNOSIS — M5136 Other intervertebral disc degeneration, lumbar region: Secondary | ICD-10-CM | POA: Diagnosis not present

## 2017-06-21 DIAGNOSIS — M62830 Muscle spasm of back: Secondary | ICD-10-CM | POA: Diagnosis not present

## 2017-06-21 DIAGNOSIS — M9905 Segmental and somatic dysfunction of pelvic region: Secondary | ICD-10-CM | POA: Diagnosis not present

## 2017-06-21 DIAGNOSIS — M5136 Other intervertebral disc degeneration, lumbar region: Secondary | ICD-10-CM | POA: Diagnosis not present

## 2017-06-21 DIAGNOSIS — M9903 Segmental and somatic dysfunction of lumbar region: Secondary | ICD-10-CM | POA: Diagnosis not present

## 2017-06-26 DIAGNOSIS — M9905 Segmental and somatic dysfunction of pelvic region: Secondary | ICD-10-CM | POA: Diagnosis not present

## 2017-06-26 DIAGNOSIS — M9903 Segmental and somatic dysfunction of lumbar region: Secondary | ICD-10-CM | POA: Diagnosis not present

## 2017-06-26 DIAGNOSIS — M5136 Other intervertebral disc degeneration, lumbar region: Secondary | ICD-10-CM | POA: Diagnosis not present

## 2017-06-26 DIAGNOSIS — M62830 Muscle spasm of back: Secondary | ICD-10-CM | POA: Diagnosis not present

## 2017-06-28 DIAGNOSIS — M5136 Other intervertebral disc degeneration, lumbar region: Secondary | ICD-10-CM | POA: Diagnosis not present

## 2017-06-28 DIAGNOSIS — M9905 Segmental and somatic dysfunction of pelvic region: Secondary | ICD-10-CM | POA: Diagnosis not present

## 2017-06-28 DIAGNOSIS — M9903 Segmental and somatic dysfunction of lumbar region: Secondary | ICD-10-CM | POA: Diagnosis not present

## 2017-06-28 DIAGNOSIS — M62830 Muscle spasm of back: Secondary | ICD-10-CM | POA: Diagnosis not present

## 2017-07-03 DIAGNOSIS — M9905 Segmental and somatic dysfunction of pelvic region: Secondary | ICD-10-CM | POA: Diagnosis not present

## 2017-07-03 DIAGNOSIS — M62830 Muscle spasm of back: Secondary | ICD-10-CM | POA: Diagnosis not present

## 2017-07-03 DIAGNOSIS — M9903 Segmental and somatic dysfunction of lumbar region: Secondary | ICD-10-CM | POA: Diagnosis not present

## 2017-07-03 DIAGNOSIS — M5136 Other intervertebral disc degeneration, lumbar region: Secondary | ICD-10-CM | POA: Diagnosis not present

## 2017-07-05 DIAGNOSIS — M5136 Other intervertebral disc degeneration, lumbar region: Secondary | ICD-10-CM | POA: Diagnosis not present

## 2017-07-05 DIAGNOSIS — M9905 Segmental and somatic dysfunction of pelvic region: Secondary | ICD-10-CM | POA: Diagnosis not present

## 2017-07-05 DIAGNOSIS — M9903 Segmental and somatic dysfunction of lumbar region: Secondary | ICD-10-CM | POA: Diagnosis not present

## 2017-07-05 DIAGNOSIS — M62830 Muscle spasm of back: Secondary | ICD-10-CM | POA: Diagnosis not present

## 2017-07-10 DIAGNOSIS — M9905 Segmental and somatic dysfunction of pelvic region: Secondary | ICD-10-CM | POA: Diagnosis not present

## 2017-07-10 DIAGNOSIS — M9903 Segmental and somatic dysfunction of lumbar region: Secondary | ICD-10-CM | POA: Diagnosis not present

## 2017-07-10 DIAGNOSIS — M62830 Muscle spasm of back: Secondary | ICD-10-CM | POA: Diagnosis not present

## 2017-07-10 DIAGNOSIS — M5136 Other intervertebral disc degeneration, lumbar region: Secondary | ICD-10-CM | POA: Diagnosis not present

## 2017-07-12 DIAGNOSIS — M62830 Muscle spasm of back: Secondary | ICD-10-CM | POA: Diagnosis not present

## 2017-07-12 DIAGNOSIS — M9903 Segmental and somatic dysfunction of lumbar region: Secondary | ICD-10-CM | POA: Diagnosis not present

## 2017-07-12 DIAGNOSIS — M9905 Segmental and somatic dysfunction of pelvic region: Secondary | ICD-10-CM | POA: Diagnosis not present

## 2017-07-12 DIAGNOSIS — M5136 Other intervertebral disc degeneration, lumbar region: Secondary | ICD-10-CM | POA: Diagnosis not present

## 2017-07-17 DIAGNOSIS — M9903 Segmental and somatic dysfunction of lumbar region: Secondary | ICD-10-CM | POA: Diagnosis not present

## 2017-07-17 DIAGNOSIS — M62830 Muscle spasm of back: Secondary | ICD-10-CM | POA: Diagnosis not present

## 2017-07-17 DIAGNOSIS — M5136 Other intervertebral disc degeneration, lumbar region: Secondary | ICD-10-CM | POA: Diagnosis not present

## 2017-07-17 DIAGNOSIS — M9905 Segmental and somatic dysfunction of pelvic region: Secondary | ICD-10-CM | POA: Diagnosis not present

## 2017-07-19 DIAGNOSIS — H353131 Nonexudative age-related macular degeneration, bilateral, early dry stage: Secondary | ICD-10-CM | POA: Diagnosis not present

## 2017-07-20 ENCOUNTER — Ambulatory Visit (INDEPENDENT_AMBULATORY_CARE_PROVIDER_SITE_OTHER): Payer: PPO

## 2017-07-20 VITALS — BP 138/66 | HR 60 | Temp 98.2°F | Resp 16 | Ht 67.0 in | Wt 208.5 lb

## 2017-07-20 DIAGNOSIS — Z0000 Encounter for general adult medical examination without abnormal findings: Secondary | ICD-10-CM | POA: Diagnosis not present

## 2017-07-20 NOTE — Patient Instructions (Signed)
Mckenzie Gutierrez , Thank you for taking time to come for your Medicare Wellness Visit. I appreciate your ongoing commitment to your health goals. Please review the following plan we discussed and let me know if I can assist you in the future.   Screening recommendations/referrals: Colonoscopy: no longer required Mammogram: no longer required Bone Density: completed 08/09/2011 Recommended yearly ophthalmology/optometry visit for glaucoma screening and checkup Recommended yearly dental visit for hygiene and checkup  Vaccinations: Influenza vaccine: up to date Pneumococcal vaccine: up to date Tdap vaccine: due, check with your insurance company for coverage  Shingles vaccine: due, check with your insurance company for coverage   Advanced directives: Please bring a copy of your health care power of attorney and living will to the office at your convenience.  Conditions/risks identified: none  Next appointment: Follow up on 07/24/2017 at 10:00am with [name].    Preventive Care 60 Years and Older, Female Preventive care refers to lifestyle choices and visits with your health care provider that can promote health and wellness. What does preventive care include?  A yearly physical exam. This is also called an annual well check.  Dental exams once or twice a year.  Routine eye exams. Ask your health care provider how often you should have your eyes checked.  Personal lifestyle choices, including:  Daily care of your teeth and gums.  Regular physical activity.  Eating a healthy diet.  Avoiding tobacco and drug use.  Limiting alcohol use.  Practicing safe sex.  Taking low-dose aspirin every day.  Taking vitamin and mineral supplements as recommended by your health care provider. What happens during an annual well check? The services and screenings done by your health care provider during your annual well check will depend on your age, overall health, lifestyle risk  factors, and family history of disease. Counseling  Your health care provider may ask you questions about your:  Alcohol use.  Tobacco use.  Drug use.  Emotional well-being.  Home and relationship well-being.  Sexual activity.  Eating habits.  History of falls.  Memory and ability to understand (cognition).  Work and work Statistician.  Reproductive health. Screening  You may have the following tests or measurements:  Height, weight, and BMI.  Blood pressure.  Lipid and cholesterol levels. These may be checked every 5 years, or more frequently if you are over 64 years old.  Skin check.  Lung cancer screening. You may have this screening every year starting at age 39 if you have a 30-pack-year history of smoking and currently smoke or have quit within the past 15 years.  Fecal occult blood test (FOBT) of the stool. You may have this test every year starting at age 51.  Flexible sigmoidoscopy or colonoscopy. You may have a sigmoidoscopy every 5 years or a colonoscopy every 10 years starting at age 39.  Hepatitis C blood test.  Hepatitis B blood test.  Sexually transmitted disease (STD) testing.  Diabetes screening. This is done by checking your blood sugar (glucose) after you have not eaten for a while (fasting). You may have this done every 1-3 years.  Bone density scan. This is done to screen for osteoporosis. You may have this done starting at age 58.  Mammogram. This may be done every 1-2 years. Talk to your health care provider about how often you should have regular mammograms. Talk with your health care provider about your test results, treatment options, and if necessary, the need for more tests. Vaccines  Your health  care provider may recommend certain vaccines, such as:  Influenza vaccine. This is recommended every year.  Tetanus, diphtheria, and acellular pertussis (Tdap, Td) vaccine. You may need a Td booster every 10 years.  Zoster vaccine. You  may need this after age 76.  Pneumococcal 13-valent conjugate (PCV13) vaccine. One dose is recommended after age 10.  Pneumococcal polysaccharide (PPSV23) vaccine. One dose is recommended after age 51. Talk to your health care provider about which screenings and vaccines you need and how often you need them. This information is not intended to replace advice given to you by your health care provider. Make sure you discuss any questions you have with your health care provider. Document Released: 09/17/2015 Document Revised: 05/10/2016 Document Reviewed: 06/22/2015 Elsevier Interactive Patient Education  2017 [place] Prevention in the Home Falls can cause injuries. They can happen to people of all ages. There are many things you can do to make your home safe and to help prevent falls. What can I do on the outside of my home?  Regularly fix the edges of walkways and driveways and fix any cracks.  Remove anything that might make you trip as you walk through a door, such as a raised step or threshold.  Trim any bushes or trees on the path to your home.  Use bright outdoor lighting.  Clear any walking paths of anything that might make someone trip, such as rocks or tools.  Regularly check to see if handrails are loose or broken. Make sure that both sides of any steps have handrails.  Any raised decks and porches should have guardrails on the edges.  Have any leaves, snow, or ice cleared regularly.  Use sand or salt on walking paths during winter.  Clean up any spills in your garage right away. This includes oil or grease spills. What can I do in the bathroom?  Use night lights.  Install grab bars by the toilet and in the tub and shower. Do not use towel bars as grab bars.  Use non-skid mats or decals in the tub or shower.  If you need to sit down in the shower, use a plastic, non-slip stool.  Keep the floor dry. Clean up any water that spills on the floor as soon as  it happens.  Remove soap buildup in the tub or shower regularly.  Attach bath mats securely with double-sided non-slip rug tape.  Do not have throw rugs and other things on the floor that can make you trip. What can I do in the bedroom?  Use night lights.  Make sure that you have a light by your bed that is easy to reach.  Do not use any sheets or blankets that are too big for your bed. They should not hang down onto the floor.  Have a firm chair that has side arms. You can use this for support while you get dressed.  Do not have throw rugs and other things on the floor that can make you trip. What can I do in the kitchen?  Clean up any spills right away.  Avoid walking on wet floors.  Keep items that you use a lot in easy-to-reach places.  If you need to reach something above you, use a strong step stool that has a grab bar.  Keep electrical cords out of the way.  Do not use floor polish or wax that makes floors slippery. If you must use wax, use non-skid floor wax.  Do not have  throw rugs and other things on the floor that can make you trip. What can I do with my stairs?  Do not leave any items on the stairs.  Make sure that there are handrails on both sides of the stairs and use them. Fix handrails that are broken or loose. Make sure that handrails are as long as the stairways.  Check any carpeting to make sure that it is firmly attached to the stairs. Fix any carpet that is loose or worn.  Avoid having throw rugs at the top or bottom of the stairs. If you do have throw rugs, attach them to the floor with carpet tape.  Make sure that you have a light switch at the top of the stairs and the bottom of the stairs. If you do not have them, ask someone to add them for you. What else can I do to help prevent falls?  Wear shoes that:  Do not have high heels.  Have rubber bottoms.  Are comfortable and fit you well.  Are closed at the toe. Do not wear sandals.  If  you use a stepladder:  Make sure that it is fully opened. Do not climb a closed stepladder.  Make sure that both sides of the stepladder are locked into place.  Ask someone to hold it for you, if possible.  Clearly mark and make sure that you can see:  Any grab bars or handrails.  First and last steps.  Where the edge of each step is.  Use tools that help you move around (mobility aids) if they are needed. These include:  Canes.  Walkers.  Scooters.  Crutches.  Turn on the lights when you go into a dark area. Replace any light bulbs as soon as they burn out.  Set up your furniture so you have a clear path. Avoid moving your furniture around.  If any of your floors are uneven, fix them.  If there are any pets around you, be aware of where they are.  Review your medicines with your doctor. Some medicines can make you feel dizzy. This can increase your chance of falling. Ask your doctor what other things that you can do to help prevent falls. This information is not intended to replace advice given to you by your health care provider. Make sure you discuss any questions you have with your health care provider. Document Released: 06/17/2009 Document Revised: 01/27/2016 Document Reviewed: 09/25/2014 Elsevier Interactive Patient Education  2017 [employer].

## 2017-07-20 NOTE — Progress Notes (Signed)
Subjective:   Mckenzie Gutierrez is a 83 y.o. female who presents for Medicare Annual (Subsequent) preventive examination.  Review of Systems:  Cardiac Risk Factors include: advanced age (>72men, >25 women);obesity (BMI >30kg/m2);dyslipidemia;hypertension     Objective:     Vitals: BP 138/66 (BP Location: Left Arm, Patient Position: Sitting)   Pulse 60   Temp 98.2 F (36.8 C) (Oral)   Resp 16   Ht 5\' 7"  (1.702 m)   Wt 208 lb 8 oz (94.6 kg)   LMP  (LMP Unknown)   BMI 32.66 kg/m   Body mass index is 32.66 kg/m.   Tobacco Social History   Tobacco Use  Smoking Status Never Smoker  Smokeless Tobacco Never Used     Counseling given: Not Answered   Past Medical History:  Diagnosis Date  . Anxiety   . Atrial premature beats   . Bradycardia   . Carotid stenosis   . Depression   . GERD (gastroesophageal reflux disease)   . Hyperlipidemia   . Hypertension   . Low back pain   . Osteoporosis   . Thyroid disease   . Urinary frequency    Past Surgical History:  Procedure Laterality Date  . CARPAL TUNNEL RELEASE    . WRIST SURGERY     Family History  Problem Relation Age of Onset  . Heart disease Mother   . Stroke Father   . Diabetes Father   . Stroke Sister    Social History   Substance and Sexual Activity  Sexual Activity No    Outpatient Encounter Medications as of 07/20/2017  Medication Sig  . amLODipine (NORVASC) 5 MG tablet TAKE 1 TABLET BY MOUTH DAILY  . apixaban (ELIQUIS) 2.5 MG TABS tablet Take 2.5 mg by mouth 2 (two) times daily.  . beta carotene w/minerals (OCUVITE) tablet Take 1 tablet by mouth daily.  . Biotin 5000 MCG CAPS Take by mouth daily.  . citalopram (CELEXA) 20 MG tablet TAKE 1 TABLET BY MOUTH EVERY DAY. TAKE 1/2 TABLET BY MOUTH EVERY EVENING AT BEDTIME.  . furosemide (LASIX) 20 MG tablet TAKE 1 TABLET BY MOUTH DAILY  . hydrocortisone (PROCTOZONE-HC) 2.5 % rectal cream Place 1 application rectally 2 (two) times daily.  Marland Kitchen  levothyroxine (SYNTHROID, LEVOTHROID) 75 MCG tablet TAKE 1 TABLET BY MOUTH DAILY  . losartan (COZAAR) 100 MG tablet TAKE ONE TABLET EVERY DAY  . Multiple Vitamin (MULTIVITAMIN) tablet Take 1 tablet by mouth daily.  . pantoprazole (PROTONIX) 20 MG tablet TAKE 1 TABLET BY MOUTH DAILY  . polyethylene glycol (MIRALAX / GLYCOLAX) packet MIX ONE PACKAGE IN LIQUID AND TAKE DAILY   No facility-administered encounter medications on file as of 07/20/2017.     Activities of Daily Living In your present state of health, do you have any difficulty performing the following activities: 07/20/2017  Hearing? Y  Vision? N  Difficulty concentrating or making decisions? N  Walking or climbing stairs? N  Dressing or bathing? N  Doing errands, shopping? N  Preparing Food and eating ? N  Using the Toilet? N  In the past six months, have you accidently leaked urine? Y  Comment pads for protection   Do you have problems with loss of bowel control? N  Managing your Medications? N  Managing your Finances? N  Housekeeping or managing your Housekeeping? N  Some recent data might be hidden    Patient Care Team: Kathrine Haddock, NP as PCP - General (Nurse Practitioner) Conception Chancy, DDS as  Referring Physician (Dentistry) Leandrew Koyanagi, MD as Referring Physician (Ophthalmology)    Assessment:     Exercise Activities and Dietary recommendations Current Exercise Habits: The patient does not participate in regular exercise at present, Exercise limited by: orthopedic condition(s)  Goals    None     Fall Risk Fall Risk  07/20/2017 02/06/2017 07/19/2016 07/14/2015  Falls in the past year? No No Yes No  Number falls in past yr: - - 1 -  Injury with Fall? - - Yes -   Depression Screen PHQ 2/9 Scores 07/20/2017 02/06/2017 07/19/2016 07/14/2015  PHQ - 2 Score 0 0 2 0  PHQ- 9 Score 4 7 6  -     Cognitive Function     6CIT Screen 07/20/2017  What Year? 0 points  What month? 0 points  What time? 0  points  Count back from 20 0 points  Months in reverse 0 points  Repeat phrase 0 points  Total Score 0    Immunization History  Administered Date(s) Administered  . Influenza, High Dose Seasonal PF 06/07/2017  . Influenza,inj,Quad PF,6+ Mos 06/15/2015  . Influenza-Unspecified 06/19/2016  . Pneumococcal Conjugate-13 12/30/2013  . Pneumococcal Polysaccharide-23 11/26/2000  . Td 12/18/2003   Screening Tests Health Maintenance  Topic Date Due  . TETANUS/TDAP  12/17/2013  . INFLUENZA VACCINE  Completed  . DEXA SCAN  Completed  . PNA vac Low Risk Adult  Completed      Plan:      I have personally reviewed and addressed the Medicare Annual Wellness questionnaire and have noted the following in the patient's chart:  A. Medical and social history B. Use of alcohol, tobacco or illicit drugs  C. Current medications and supplements D. Functional ability and status E.  Nutritional status F.  Physical activity G. Advance directives H. List of other physicians I.  Hospitalizations, surgeries, and ER visits in previous 12 months J.  [place] such as hearing and vision if needed, cognitive and depression L. Referrals and appointments   In addition, I have reviewed and discussed with patient certain preventive protocols, quality metrics, and best practice recommendations. A written personalized care plan for preventive services as well as general preventive health recommendations were provided to patient.   Signed,  Tyler Aas, LPN Nurse Health Advisor   Nurse Notes: needs refill on hydrocortisone cream.

## 2017-07-23 DIAGNOSIS — M9903 Segmental and somatic dysfunction of lumbar region: Secondary | ICD-10-CM | POA: Diagnosis not present

## 2017-07-23 DIAGNOSIS — M9905 Segmental and somatic dysfunction of pelvic region: Secondary | ICD-10-CM | POA: Diagnosis not present

## 2017-07-23 DIAGNOSIS — M5136 Other intervertebral disc degeneration, lumbar region: Secondary | ICD-10-CM | POA: Diagnosis not present

## 2017-07-23 DIAGNOSIS — M62830 Muscle spasm of back: Secondary | ICD-10-CM | POA: Diagnosis not present

## 2017-07-24 ENCOUNTER — Encounter: Payer: Self-pay | Admitting: Unknown Physician Specialty

## 2017-07-24 ENCOUNTER — Ambulatory Visit (INDEPENDENT_AMBULATORY_CARE_PROVIDER_SITE_OTHER): Payer: PPO | Admitting: Unknown Physician Specialty

## 2017-07-24 DIAGNOSIS — E785 Hyperlipidemia, unspecified: Secondary | ICD-10-CM | POA: Diagnosis not present

## 2017-07-24 DIAGNOSIS — F419 Anxiety disorder, unspecified: Secondary | ICD-10-CM | POA: Diagnosis not present

## 2017-07-24 DIAGNOSIS — D6869 Other thrombophilia: Secondary | ICD-10-CM | POA: Insufficient documentation

## 2017-07-24 DIAGNOSIS — E039 Hypothyroidism, unspecified: Secondary | ICD-10-CM

## 2017-07-24 DIAGNOSIS — F329 Major depressive disorder, single episode, unspecified: Secondary | ICD-10-CM | POA: Diagnosis not present

## 2017-07-24 DIAGNOSIS — I10 Essential (primary) hypertension: Secondary | ICD-10-CM | POA: Diagnosis not present

## 2017-07-24 DIAGNOSIS — F32A Depression, unspecified: Secondary | ICD-10-CM

## 2017-07-24 DIAGNOSIS — Z7901 Long term (current) use of anticoagulants: Secondary | ICD-10-CM

## 2017-07-24 NOTE — Assessment & Plan Note (Signed)
Stable, continue present medications.   

## 2017-07-24 NOTE — Assessment & Plan Note (Signed)
Will need CBC.

## 2017-07-24 NOTE — Assessment & Plan Note (Signed)
Not on statins due to [facility]

## 2017-07-24 NOTE — Progress Notes (Signed)
BP 115/78 (BP Location: Left Arm, Cuff Size: Large)   Pulse 85   Temp 98 F (36.7 C) (Oral)   Ht 5\' 7"  (1.702 m)   Wt 206 lb (93.4 kg)   LMP  (LMP Unknown)   SpO2 93%   BMI 32.26 kg/m    Subjective:    Patient ID: Mckenzie Gutierrez, female    DOB: Apr 26, 1922, 83 y.o.   MRN: [id number]  HPI: Mckenzie Gutierrez is a 83 y.o. female  Chief Complaint  Patient presents with  . Annual Exam    pt had wellness exam with [facility] 07/20/17   Hypertension Using medications without difficulty Average home BPs Not checking   No problems or lightheadedness No chest pain with exertion or shortness of breath No Edema  Depression Stable without complaints Depression screen [facility] 2/9 07/20/2017 02/06/2017 07/19/2016 07/14/2015  Decreased Interest 0 0 1 0  Down, Depressed, Hopeless 0 0 1 0  PHQ - 2 Score 0 0 2 0  Altered sleeping 0 1 0 -  Tired, decreased energy 1 3 3  -  Change in appetite 3 3 0 -  Feeling bad or failure about yourself  0 0 1 -  Trouble concentrating 0 0 0 -  Moving slowly or fidgety/restless 0 0 0 -  Suicidal thoughts 0 0 0 -  PHQ-9 Score 4 7 6  -  Difficult doing work/chores Not difficult at all - - -   Hypothyroid States she is getting weaker as she gets ollder  A-fib Rate controlled.  Sees cardiology q 6 months  Relevant past medical, surgical, family and social history reviewed and updated as indicated. Interim medical history since our last visit reviewed. Allergies and medications reviewed and updated.  Review of Systems  Per HPI unless specifically indicated above     Objective:    BP 115/78 (BP Location: Left Arm, Cuff Size: Large)   Pulse 85   Temp 98 F (36.7 C) (Oral)   Ht 5\' 7"  (1.702 m)   Wt 206 lb (93.4 kg)   LMP  (LMP Unknown)   SpO2 93%   BMI 32.26 kg/m   Wt Readings from Last 3 Encounters:  07/24/17 206 lb (93.4 kg)  07/20/17 208 lb 8 oz (94.6 kg)  02/20/17 203 lb (92.1 kg)    Physical Exam  Constitutional: She is oriented to person,  place, and time. She appears well-developed and well-nourished. No distress.  HENT:  Head: Normocephalic and atraumatic.  Eyes: Conjunctivae and lids are normal. Right eye exhibits no discharge. Left eye exhibits no discharge. No scleral icterus.  Neck: Normal range of motion. Neck supple. No JVD present. Carotid bruit is not present.  Cardiovascular: Normal rate, regular rhythm and normal heart sounds.  Pulmonary/Chest: Effort normal and breath sounds normal.  Abdominal: Normal appearance. There is no splenomegaly or hepatomegaly.  Musculoskeletal: Normal range of motion.  Neurological: She is alert and oriented to person, place, and time.  Skin: Skin is warm, dry and intact. No rash noted. No pallor.  Psychiatric: She has a normal mood and affect. Her behavior is normal. Judgment and thought content normal.      Assessment & Plan:   Problem List Items Addressed This Visit      Unprioritized   Anxiety and depression    Stable, continue present medications.        HLD (hyperlipidemia)    Not on statins due to myalias       Hypertension  Stable, continue present medications.        Relevant Orders   Comprehensive metabolic panel   Lipid Panel w/o Chol/HDL Ratio   Hypothyroid    Check TSH.       Relevant Orders   TSH   Long term (current) use of anticoagulants    Will need CBC.        Relevant Orders   CBC with Differential/Platelet       Follow up plan: Return in about 6 months (around 01/21/2018).

## 2017-07-24 NOTE — Assessment & Plan Note (Signed)
Check TSH 

## 2017-07-25 ENCOUNTER — Encounter: Payer: Self-pay | Admitting: Unknown Physician Specialty

## 2017-07-25 LAB — COMPREHENSIVE METABOLIC PANEL
A/G RATIO: 1.6 (ref 1.2–2.2)
ALBUMIN: 4.3 g/dL (ref 3.2–4.6)
ALK PHOS: 65 IU/L (ref 39–117)
ALT: 12 IU/L (ref 0–32)
AST: 23 IU/L (ref 0–40)
BILIRUBIN TOTAL: 0.9 mg/dL (ref 0.0–1.2)
BUN / CREAT RATIO: 13 (ref 12–28)
BUN: 12 mg/dL (ref 10–36)
CHLORIDE: 103 mmol/L (ref 96–106)
CO2: 26 mmol/L (ref 20–29)
Calcium: 9.7 mg/dL (ref 8.7–10.3)
Creatinine, Ser: 0.9 mg/dL (ref 0.57–1.00)
GFR calc non Af Amer: 55 mL/min/{1.73_m2} — ABNORMAL LOW (ref >59)
GFR, EST AFRICAN AMERICAN: 64 mL/min/{1.73_m2} (ref >59)
GLUCOSE: 111 mg/dL — AB (ref 65–99)
Globulin, Total: 2.7 g/dL (ref 1.5–4.5)
POTASSIUM: 4.3 mmol/L (ref 3.5–5.2)
Sodium: 142 mmol/L (ref 134–144)
Total Protein: 7 g/dL (ref 6.0–8.5)

## 2017-07-25 LAB — CBC WITH DIFFERENTIAL/PLATELET
BASOS: 1 %
Basophils Absolute: 0.1 10*3/uL (ref 0.0–0.2)
EOS (ABSOLUTE): 0.1 10*3/uL (ref 0.0–0.4)
EOS: 1 %
HEMATOCRIT: 42 % (ref 34.0–46.6)
Hemoglobin: 13.7 g/dL (ref 11.1–15.9)
Immature Grans (Abs): 0 10*3/uL (ref 0.0–0.1)
Immature Granulocytes: 0 %
LYMPHS ABS: 3 10*3/uL (ref 0.7–3.1)
Lymphs: 40 %
MCH: 32.6 pg (ref 26.6–33.0)
MCHC: 32.6 g/dL (ref 31.5–35.7)
MCV: 100 fL — AB (ref 79–97)
MONOS ABS: 0.6 10*3/uL (ref 0.1–0.9)
Monocytes: 8 %
NEUTROS PCT: 50 %
Neutrophils Absolute: 3.8 10*3/uL (ref 1.4–7.0)
Platelets: 189 10*3/uL (ref 150–379)
RBC: 4.2 x10E6/uL (ref 3.77–5.28)
RDW: 15.6 % — AB (ref 12.3–15.4)
WBC: 7.5 10*3/uL (ref 3.4–10.8)

## 2017-07-25 LAB — LIPID PANEL W/O CHOL/HDL RATIO
Cholesterol, Total: 160 mg/dL (ref 100–199)
HDL: 50 mg/dL (ref >39)
LDL Calculated: 89 mg/dL (ref 0–99)
TRIGLYCERIDES: 103 mg/dL (ref 0–149)
VLDL CHOLESTEROL CAL: 21 mg/dL (ref 5–40)

## 2017-07-25 LAB — TSH: TSH: 2.87 u[IU]/mL (ref 0.450–4.500)

## 2017-07-31 DIAGNOSIS — J069 Acute upper respiratory infection, unspecified: Secondary | ICD-10-CM | POA: Diagnosis not present

## 2017-07-31 DIAGNOSIS — R05 Cough: Secondary | ICD-10-CM | POA: Diagnosis not present

## 2017-07-31 DIAGNOSIS — J019 Acute sinusitis, unspecified: Secondary | ICD-10-CM | POA: Diagnosis not present

## 2017-08-06 DIAGNOSIS — M5136 Other intervertebral disc degeneration, lumbar region: Secondary | ICD-10-CM | POA: Diagnosis not present

## 2017-08-06 DIAGNOSIS — M62830 Muscle spasm of back: Secondary | ICD-10-CM | POA: Diagnosis not present

## 2017-08-06 DIAGNOSIS — M9905 Segmental and somatic dysfunction of pelvic region: Secondary | ICD-10-CM | POA: Diagnosis not present

## 2017-08-06 DIAGNOSIS — M9903 Segmental and somatic dysfunction of lumbar region: Secondary | ICD-10-CM | POA: Diagnosis not present

## 2017-08-20 DIAGNOSIS — I4891 Unspecified atrial fibrillation: Secondary | ICD-10-CM | POA: Diagnosis not present

## 2017-08-20 DIAGNOSIS — I272 Pulmonary hypertension, unspecified: Secondary | ICD-10-CM | POA: Diagnosis not present

## 2017-08-20 DIAGNOSIS — E782 Mixed hyperlipidemia: Secondary | ICD-10-CM | POA: Diagnosis not present

## 2017-08-20 DIAGNOSIS — R600 Localized edema: Secondary | ICD-10-CM | POA: Diagnosis not present

## 2017-08-20 DIAGNOSIS — R011 Cardiac murmur, unspecified: Secondary | ICD-10-CM | POA: Diagnosis not present

## 2017-08-20 DIAGNOSIS — I739 Peripheral vascular disease, unspecified: Secondary | ICD-10-CM | POA: Diagnosis not present

## 2017-08-20 DIAGNOSIS — K219 Gastro-esophageal reflux disease without esophagitis: Secondary | ICD-10-CM | POA: Diagnosis not present

## 2017-08-20 DIAGNOSIS — E669 Obesity, unspecified: Secondary | ICD-10-CM | POA: Diagnosis not present

## 2017-08-20 DIAGNOSIS — E079 Disorder of thyroid, unspecified: Secondary | ICD-10-CM | POA: Diagnosis not present

## 2017-08-20 DIAGNOSIS — I10 Essential (primary) hypertension: Secondary | ICD-10-CM | POA: Diagnosis not present

## 2017-08-20 DIAGNOSIS — R0602 Shortness of breath: Secondary | ICD-10-CM | POA: Diagnosis not present

## 2017-09-12 ENCOUNTER — Other Ambulatory Visit: Payer: Self-pay | Admitting: Unknown Physician Specialty

## 2017-09-13 ENCOUNTER — Other Ambulatory Visit: Payer: Self-pay | Admitting: Unknown Physician Specialty

## 2017-10-01 DIAGNOSIS — R600 Localized edema: Secondary | ICD-10-CM | POA: Diagnosis not present

## 2017-10-01 DIAGNOSIS — I739 Peripheral vascular disease, unspecified: Secondary | ICD-10-CM | POA: Diagnosis not present

## 2017-10-01 DIAGNOSIS — R0602 Shortness of breath: Secondary | ICD-10-CM | POA: Diagnosis not present

## 2017-10-01 DIAGNOSIS — I4891 Unspecified atrial fibrillation: Secondary | ICD-10-CM | POA: Diagnosis not present

## 2017-10-01 DIAGNOSIS — I10 Essential (primary) hypertension: Secondary | ICD-10-CM | POA: Diagnosis not present

## 2017-10-01 DIAGNOSIS — E079 Disorder of thyroid, unspecified: Secondary | ICD-10-CM | POA: Diagnosis not present

## 2017-10-01 DIAGNOSIS — E669 Obesity, unspecified: Secondary | ICD-10-CM | POA: Diagnosis not present

## 2017-10-01 DIAGNOSIS — R011 Cardiac murmur, unspecified: Secondary | ICD-10-CM | POA: Diagnosis not present

## 2017-10-01 DIAGNOSIS — I272 Pulmonary hypertension, unspecified: Secondary | ICD-10-CM | POA: Diagnosis not present

## 2017-10-01 DIAGNOSIS — E782 Mixed hyperlipidemia: Secondary | ICD-10-CM | POA: Diagnosis not present

## 2017-10-01 DIAGNOSIS — K219 Gastro-esophageal reflux disease without esophagitis: Secondary | ICD-10-CM | POA: Diagnosis not present

## 2017-10-03 ENCOUNTER — Other Ambulatory Visit: Payer: Self-pay | Admitting: Unknown Physician Specialty

## 2017-10-03 DIAGNOSIS — M549 Dorsalgia, unspecified: Principal | ICD-10-CM

## 2017-10-03 DIAGNOSIS — M5430 Sciatica, unspecified side: Secondary | ICD-10-CM

## 2017-10-15 DIAGNOSIS — M48061 Spinal stenosis, lumbar region without neurogenic claudication: Secondary | ICD-10-CM | POA: Insufficient documentation

## 2017-10-15 DIAGNOSIS — M545 Low back pain: Secondary | ICD-10-CM | POA: Diagnosis not present

## 2017-10-15 DIAGNOSIS — M47816 Spondylosis without myelopathy or radiculopathy, lumbar region: Secondary | ICD-10-CM | POA: Insufficient documentation

## 2017-11-01 DIAGNOSIS — M5136 Other intervertebral disc degeneration, lumbar region: Secondary | ICD-10-CM | POA: Diagnosis not present

## 2017-11-01 DIAGNOSIS — M5416 Radiculopathy, lumbar region: Secondary | ICD-10-CM | POA: Diagnosis not present

## 2017-11-16 ENCOUNTER — Ambulatory Visit (INDEPENDENT_AMBULATORY_CARE_PROVIDER_SITE_OTHER): Payer: PPO | Admitting: Family Medicine

## 2017-11-16 ENCOUNTER — Encounter: Payer: Self-pay | Admitting: Family Medicine

## 2017-11-16 VITALS — BP 129/77 | HR 88 | Temp 98.2°F | Wt 205.8 lb

## 2017-11-16 DIAGNOSIS — J069 Acute upper respiratory infection, unspecified: Secondary | ICD-10-CM | POA: Diagnosis not present

## 2017-11-16 MED ORDER — AMOXICILLIN-POT CLAVULANATE 875-125 MG PO TABS: 1.0000 | ORAL_TABLET | Freq: Two times a day (BID) | ORAL | 0 refills | Status: DC

## 2017-11-16 MED ORDER — GUAIFENESIN ER 600 MG PO TB12: 600.0000 mg | ORAL_TABLET | Freq: Two times a day (BID) | ORAL | 0 refills | Status: DC

## 2017-11-16 NOTE — Patient Instructions (Signed)
Take plain mucinex twice daily, run a humidifier especially overnight. Can also take some zyrtec or claritin daily to help reduce mucus production. Pick up some Delsym cough syrup to help reduce cough symptoms.    If worsening or not getting better over the weekend, start the antibiotic (augmentin).

## 2017-11-16 NOTE — Progress Notes (Signed)
BP 129/77   Pulse 88   Temp 98.2 F (36.8 C) (Oral)   Wt 205 lb 12.8 oz (93.4 kg)   LMP  (LMP Unknown)   SpO2 96%   BMI 32.23 kg/m    Subjective:    Patient ID: Mckenzie Gutierrez, female    DOB: [birth year], 84 y.o.   MRN: [id number]  HPI: Mckenzie Gutierrez is a 84 y.o. female  Chief Complaint  Patient presents with  . Cough    pt states she has had a productive cough since Monday night   Productive cough, congestion, and chest tightness x 5 days. Cough seems to be worsening with thickening mucus. Denies fever, chills, CP, SOB. Tried some tessalon perles with worse sxs afterward. Taking some tylenol. Denies sick contacts. Denies hx of seasonal allergies.   Relevant past medical, surgical, family and social history reviewed and updated as indicated. Interim medical history since our last visit reviewed. Allergies and medications reviewed and updated.  Review of Systems  Per HPI unless specifically indicated above     Objective:    BP 129/77   Pulse 88   Temp 98.2 F (36.8 C) (Oral)   Wt 205 lb 12.8 oz (93.4 kg)   LMP  (LMP Unknown)   SpO2 96%   BMI 32.23 kg/m   Wt Readings from Last 3 Encounters:  11/16/17 205 lb 12.8 oz (93.4 kg)  07/24/17 206 lb (93.4 kg)  07/20/17 208 lb 8 oz (94.6 kg)    Physical Exam  Constitutional: She is oriented to person, place, and time. She appears well-developed and well-nourished. No distress.  HENT:  Head: Atraumatic.  Right Ear: External ear normal.  Left Ear: External ear normal.  Mouth/Throat: No oropharyngeal exudate.  Oropharynx erythematous  Eyes: Conjunctivae are normal. Pupils are equal, round, and reactive to light.  Neck: Normal range of motion. Neck supple.  Cardiovascular: Normal rate and normal heart sounds.  Pulmonary/Chest: Effort normal and breath sounds normal. No respiratory distress. She has no wheezes. She has no rales.  Musculoskeletal: Normal range of motion.  Neurological: She is alert and oriented to  person, place, and time.  Skin: Skin is warm and dry.  Psychiatric: She has a normal mood and affect. Her behavior is normal.  Nursing note and vitals reviewed.   Results for orders placed or performed in visit on 07/24/17  Comprehensive metabolic panel  Result Value Ref Range   Glucose 111 (H) 65 - 99 mg/dL   BUN 12 10 - 36 mg/dL   Creatinine, Ser 0.90 0.57 - 1.00 mg/dL   GFR calc non Af Amer 55 (L) >59 mL/min/1.73   GFR calc Af Amer 64 >59 mL/min/1.73   BUN/Creatinine Ratio 13 12 - 28   Sodium 142 134 - 144 mmol/L   Potassium 4.3 3.5 - 5.2 mmol/L   Chloride 103 96 - 106 mmol/L   CO2 26 20 - 29 mmol/L   Calcium 9.7 8.7 - 10.3 mg/dL   Total Protein 7.0 6.0 - 8.5 g/dL   Albumin 4.3 3.2 - 4.6 g/dL   Globulin, Total 2.7 1.5 - 4.5 g/dL   Albumin/Globulin Ratio 1.6 1.2 - 2.2   Bilirubin Total 0.9 0.0 - 1.2 mg/dL   Alkaline Phosphatase 65 39 - 117 IU/L   AST 23 0 - 40 IU/L   ALT 12 0 - 32 IU/L  Lipid Panel w/o Chol/HDL Ratio  Result Value Ref Range   Cholesterol, Total 160 100 - 199 mg/dL  Triglycerides 103 0 - 149 mg/dL   HDL 50 >39 mg/dL   VLDL Cholesterol Cal 21 5 - 40 mg/dL   LDL Calculated 89 0 - 99 mg/dL  CBC with Differential/Platelet  Result Value Ref Range   WBC 7.5 3.4 - 10.8 x10E3/uL   RBC 4.20 3.77 - 5.28 x10E6/uL   Hemoglobin 13.7 11.1 - 15.9 g/dL   Hematocrit 42.0 34.0 - 46.6 %   MCV 100 (H) 79 - 97 fL   MCH 32.6 26.6 - 33.0 pg   MCHC 32.6 31.5 - 35.7 g/dL   RDW 15.6 (H) 12.3 - 15.4 %   Platelets 189 150 - 379 x10E3/uL   Neutrophils 50 Not Estab. %   Lymphs 40 Not Estab. %   Monocytes 8 Not Estab. %   Eos 1 Not Estab. %   Basos 1 Not Estab. %   Neutrophils Absolute 3.8 1.4 - 7.0 x10E3/uL   Lymphocytes Absolute 3.0 0.7 - 3.1 x10E3/uL   Monocytes Absolute 0.6 0.1 - 0.9 x10E3/uL   EOS (ABSOLUTE) 0.1 0.0 - 0.4 x10E3/uL   Basophils Absolute 0.1 0.0 - 0.2 x10E3/uL   Immature Granulocytes 0 Not Estab. %   Immature Grans (Abs) 0.0 0.0 - 0.1 x10E3/uL  TSH    Result Value Ref Range   TSH 2.870 0.450 - 4.500 uIU/mL      Assessment & Plan:   Problem List Items Addressed This Visit    None    Visit Diagnoses    Upper respiratory tract infection, unspecified type    -  Primary   Discussed plain mucinex, delsym, humidifier. If worsening or not improving over next few days, may start augmentin. F/u if no improvement       Follow up plan: Return for as scheduled.

## 2017-11-21 DIAGNOSIS — M5416 Radiculopathy, lumbar region: Secondary | ICD-10-CM | POA: Diagnosis not present

## 2017-11-21 DIAGNOSIS — M5136 Other intervertebral disc degeneration, lumbar region: Secondary | ICD-10-CM | POA: Diagnosis not present

## 2017-11-26 ENCOUNTER — Telehealth: Payer: Self-pay | Admitting: Unknown Physician Specialty

## 2017-11-26 MED ORDER — DOXYCYCLINE HYCLATE 100 MG PO TABS
100.0000 mg | ORAL_TABLET | Freq: Two times a day (BID) | ORAL | 0 refills | Status: DC
Start: 2017-11-26 — End: 2018-01-22

## 2017-11-26 NOTE — Telephone Encounter (Signed)
Sent in new antibiotic, will need to be seen if no better with that

## 2017-11-26 NOTE — Telephone Encounter (Signed)
Patient notified

## 2017-11-26 NOTE — Telephone Encounter (Signed)
Copied from [place] [id number]. Topic: Quick Communication - See Telephone Encounter >> Nov 26, 2017  8:14 AM Boyd Kerbs wrote: CRM for notification.   Pt. Took antibiotic but is not feeling any better. Asking to have something else called in.    [facility] - [place], [place] - [address] [place] [address] Phone: [phone] Fax: [phone]   See Telephone encounter for: 11/26/17.

## 2017-12-04 DIAGNOSIS — H903 Sensorineural hearing loss, bilateral: Secondary | ICD-10-CM | POA: Diagnosis not present

## 2017-12-04 DIAGNOSIS — H6123 Impacted cerumen, bilateral: Secondary | ICD-10-CM | POA: Diagnosis not present

## 2017-12-04 DIAGNOSIS — H65112 Acute and subacute allergic otitis media (mucoid) (sanguinous) (serous), left ear: Secondary | ICD-10-CM | POA: Diagnosis not present

## 2017-12-06 ENCOUNTER — Other Ambulatory Visit: Payer: Self-pay | Admitting: Unknown Physician Specialty

## 2017-12-20 DIAGNOSIS — M5136 Other intervertebral disc degeneration, lumbar region: Secondary | ICD-10-CM | POA: Diagnosis not present

## 2017-12-20 DIAGNOSIS — M5416 Radiculopathy, lumbar region: Secondary | ICD-10-CM | POA: Diagnosis not present

## 2017-12-26 DIAGNOSIS — H903 Sensorineural hearing loss, bilateral: Secondary | ICD-10-CM | POA: Diagnosis not present

## 2018-01-17 DIAGNOSIS — D3131 Benign neoplasm of right choroid: Secondary | ICD-10-CM | POA: Diagnosis not present

## 2018-01-22 ENCOUNTER — Encounter: Payer: Self-pay | Admitting: Unknown Physician Specialty

## 2018-01-22 ENCOUNTER — Ambulatory Visit (INDEPENDENT_AMBULATORY_CARE_PROVIDER_SITE_OTHER): Payer: PPO | Admitting: Unknown Physician Specialty

## 2018-01-22 DIAGNOSIS — M545 Low back pain: Secondary | ICD-10-CM

## 2018-01-22 DIAGNOSIS — R5383 Other fatigue: Secondary | ICD-10-CM

## 2018-01-22 DIAGNOSIS — R350 Frequency of micturition: Secondary | ICD-10-CM

## 2018-01-22 DIAGNOSIS — G8929 Other chronic pain: Secondary | ICD-10-CM

## 2018-01-22 DIAGNOSIS — I10 Essential (primary) hypertension: Secondary | ICD-10-CM | POA: Diagnosis not present

## 2018-01-22 DIAGNOSIS — F325 Major depressive disorder, single episode, in full remission: Secondary | ICD-10-CM

## 2018-01-22 DIAGNOSIS — E039 Hypothyroidism, unspecified: Secondary | ICD-10-CM | POA: Diagnosis not present

## 2018-01-22 DIAGNOSIS — I4891 Unspecified atrial fibrillation: Secondary | ICD-10-CM | POA: Diagnosis not present

## 2018-01-22 NOTE — Assessment & Plan Note (Signed)
Stable, continue present medications.   

## 2018-01-22 NOTE — Assessment & Plan Note (Signed)
Suspect related to age and chronic pain issues.  Check CBC in addition to thyroid

## 2018-01-22 NOTE — Assessment & Plan Note (Signed)
Check urine.

## 2018-01-22 NOTE — Assessment & Plan Note (Signed)
Taking Eliquis 2.5 mg daily

## 2018-01-22 NOTE — Progress Notes (Signed)
BP 139/73   Pulse 81   Temp 97.8 F (36.6 C) (Oral)   Ht 5\' 7"  (1.702 m)   Wt 203 lb 12.8 oz (92.4 kg)   LMP  (LMP Unknown)   SpO2 93%   BMI 31.92 kg/m    Subjective:    Patient ID: Mckenzie Gutierrez, female    DOB: [birth year], 84 y.o.   MRN: [id number]  HPI: Mckenzie Gutierrez is a 84 y.o. female  Chief Complaint  Patient presents with  . Depression  . Hypertension  . Hypothyroidism   Hypertension Using medications without difficulty Average home BPs   No problems or lightheadedness No chest pain with exertion or shortness of breath No Edema  Hypothyroid No weight changes.  Definitely low energy.    Low back pain She has tried a number of things including a chiropractor.  She has gotten shots which are not helping past 5 days.  She does not want to go to PT as she feels she does not have the energy to do more.  Has a new area in the upper back which catches  Depression Doing well according to PHQ-9.  Pt is just frustrated with fatigue and back pain  Depression screen [facility] 2/9 01/22/2018 07/20/2017 02/06/2017 07/19/2016 07/14/2015  Decreased Interest 0 0 0 1 0  Down, Depressed, Hopeless 0 0 0 1 0  PHQ - 2 Score 0 0 0 2 0  Altered sleeping 0 0 1 0 -  Tired, decreased energy 3 1 3 3  -  Change in appetite 0 3 3 0 -  Feeling bad or failure about yourself  0 0 0 1 -  Trouble concentrating 0 0 0 0 -  Moving slowly or fidgety/restless 0 0 0 0 -  Suicidal thoughts 0 0 0 0 -  PHQ-9 Score 3 4 7 6  -  Difficult doing work/chores - Not difficult at all - - -   Relevant past medical, surgical, family and social history reviewed and updated as indicated. Interim medical history since our last visit reviewed. Allergies and medications reviewed and updated.  Review of Systems  Constitutional: Positive for fatigue.  HENT: Negative.   Eyes: Negative.   Respiratory: Negative.   Cardiovascular: Positive for leg swelling.  Gastrointestinal: Negative.   Endocrine: Negative.     Genitourinary: Positive for frequency.       Gets up multiple times a night to use the bathroom  Musculoskeletal: Negative.   Neurological: Negative.     Per HPI unless specifically indicated above     Objective:    BP 139/73   Pulse 81   Temp 97.8 F (36.6 C) (Oral)   Ht 5\' 7"  (1.702 m)   Wt 203 lb 12.8 oz (92.4 kg)   LMP  (LMP Unknown)   SpO2 93%   BMI 31.92 kg/m   Wt Readings from Last 3 Encounters:  01/22/18 203 lb 12.8 oz (92.4 kg)  11/16/17 205 lb 12.8 oz (93.4 kg)  07/24/17 206 lb (93.4 kg)    Physical Exam  Constitutional: She is oriented to person, place, and time. She appears well-developed and well-nourished. No distress.  HENT:  Head: Normocephalic and atraumatic.  Eyes: Conjunctivae and lids are normal. Right eye exhibits no discharge. Left eye exhibits no discharge. No scleral icterus.  Neck: Normal range of motion. Neck supple. No JVD present. Carotid bruit is not present.  Cardiovascular: Normal rate and normal heart sounds. An irregularly irregular rhythm present.  Pulmonary/Chest: Effort normal  and breath sounds normal.  Abdominal: Normal appearance. There is no splenomegaly or hepatomegaly.  Musculoskeletal: Normal range of motion.  Neurological: She is alert and oriented to person, place, and time.  Skin: Skin is warm, dry and intact. No rash noted. No pallor.  Psychiatric: She has a normal mood and affect. Her behavior is normal. Judgment and thought content normal.    Results for orders placed or performed in visit on 07/24/17  Comprehensive metabolic panel  Result Value Ref Range   Glucose 111 (H) 65 - 99 mg/dL   BUN 12 10 - 36 mg/dL   Creatinine, Ser 0.90 0.57 - 1.00 mg/dL   GFR calc non Af Amer 55 (L) >59 mL/min/1.73   GFR calc Af Amer 64 >59 mL/min/1.73   BUN/Creatinine Ratio 13 12 - 28   Sodium 142 134 - 144 mmol/L   Potassium 4.3 3.5 - 5.2 mmol/L   Chloride 103 96 - 106 mmol/L   CO2 26 20 - 29 mmol/L   Calcium 9.7 8.7 - 10.3 mg/dL    Total Protein 7.0 6.0 - 8.5 g/dL   Albumin 4.3 3.2 - 4.6 g/dL   Globulin, Total 2.7 1.5 - 4.5 g/dL   Albumin/Globulin Ratio 1.6 1.2 - 2.2   Bilirubin Total 0.9 0.0 - 1.2 mg/dL   Alkaline Phosphatase 65 39 - 117 IU/L   AST 23 0 - 40 IU/L   ALT 12 0 - 32 IU/L  Lipid Panel w/o Chol/HDL Ratio  Result Value Ref Range   Cholesterol, Total 160 100 - 199 mg/dL   Triglycerides 103 0 - 149 mg/dL   HDL 50 >39 mg/dL   VLDL Cholesterol Cal 21 5 - 40 mg/dL   LDL Calculated 89 0 - 99 mg/dL  CBC with Differential/Platelet  Result Value Ref Range   WBC 7.5 3.4 - 10.8 x10E3/uL   RBC 4.20 3.77 - 5.28 x10E6/uL   Hemoglobin 13.7 11.1 - 15.9 g/dL   Hematocrit 42.0 34.0 - 46.6 %   MCV 100 (H) 79 - 97 fL   MCH 32.6 26.6 - 33.0 pg   MCHC 32.6 31.5 - 35.7 g/dL   RDW 15.6 (H) 12.3 - 15.4 %   Platelets 189 150 - 379 x10E3/uL   Neutrophils 50 Not Estab. %   Lymphs 40 Not Estab. %   Monocytes 8 Not Estab. %   Eos 1 Not Estab. %   Basos 1 Not Estab. %   Neutrophils Absolute 3.8 1.4 - 7.0 x10E3/uL   Lymphocytes Absolute 3.0 0.7 - 3.1 x10E3/uL   Monocytes Absolute 0.6 0.1 - 0.9 x10E3/uL   EOS (ABSOLUTE) 0.1 0.0 - 0.4 x10E3/uL   Basophils Absolute 0.1 0.0 - 0.2 x10E3/uL   Immature Granulocytes 0 Not Estab. %   Immature Grans (Abs) 0.0 0.0 - 0.1 x10E3/uL  TSH  Result Value Ref Range   TSH 2.870 0.450 - 4.500 uIU/mL      Assessment & Plan:   Problem List Items Addressed This Visit      Unprioritized   Fatigue    Suspect related to age and chronic pain issues.  Check CBC in addition to thyroid      Relevant Orders   CBC with Differential/Platelet   Hypertension    Stable, continue present medications.        Relevant Orders   Comprehensive metabolic panel   Hypothyroid    Check thyroid panel today      Relevant Orders   Thyroid Panel With TSH  Low back pain    Seeing Dr Sharlet Salina.  Discussed PT.  Pt refusing      Major depression in complete remission ([place])    Stable, continue  present medications.        New onset a-fib [facility])    Taking Eliquis 2.5 mg daily      Urinary frequency    Check urine      Relevant Orders   UA/M w/rflx Culture, Routine       Follow up plan: Return in about 6 months (around 07/25/2018).

## 2018-01-22 NOTE — Assessment & Plan Note (Signed)
Seeing Dr Sharlet Salina.  Discussed PT.  Pt refusing

## 2018-01-22 NOTE — Assessment & Plan Note (Signed)
Check thyroid panel today

## 2018-01-23 ENCOUNTER — Encounter: Payer: Self-pay | Admitting: Unknown Physician Specialty

## 2018-01-23 LAB — CBC WITH DIFFERENTIAL/PLATELET
BASOS: 1 %
Basophils Absolute: 0.1 10*3/uL (ref 0.0–0.2)
EOS (ABSOLUTE): 0.1 10*3/uL (ref 0.0–0.4)
EOS: 1 %
HEMATOCRIT: 36.4 % (ref 34.0–46.6)
Hemoglobin: 12.3 g/dL (ref 11.1–15.9)
IMMATURE GRANS (ABS): 0 10*3/uL (ref 0.0–0.1)
Immature Granulocytes: 0 %
Lymphocytes Absolute: 2.7 10*3/uL (ref 0.7–3.1)
Lymphs: 41 %
MCH: 32.1 pg (ref 26.6–33.0)
MCHC: 33.8 g/dL (ref 31.5–35.7)
MCV: 95 fL (ref 79–97)
MONOS ABS: 0.8 10*3/uL (ref 0.1–0.9)
Monocytes: 12 %
NEUTROS ABS: 3 10*3/uL (ref 1.4–7.0)
Neutrophils: 45 %
PLATELETS: 175 10*3/uL (ref 150–450)
RBC: 3.83 x10E6/uL (ref 3.77–5.28)
RDW: 16 % — AB (ref 12.3–15.4)
WBC: 6.7 10*3/uL (ref 3.4–10.8)

## 2018-01-23 LAB — COMPREHENSIVE METABOLIC PANEL
ALBUMIN: 4.1 g/dL (ref 3.2–4.6)
ALT: 11 IU/L (ref 0–32)
AST: 19 IU/L (ref 0–40)
Albumin/Globulin Ratio: 2 (ref 1.2–2.2)
Alkaline Phosphatase: 68 IU/L (ref 39–117)
BUN / CREAT RATIO: 11 — AB (ref 12–28)
BUN: 9 mg/dL — AB (ref 10–36)
Bilirubin Total: 0.6 mg/dL (ref 0.0–1.2)
CO2: 24 mmol/L (ref 20–29)
CREATININE: 0.82 mg/dL (ref 0.57–1.00)
Calcium: 9.2 mg/dL (ref 8.7–10.3)
Chloride: 103 mmol/L (ref 96–106)
GFR calc non Af Amer: 62 mL/min/{1.73_m2} (ref >59)
GFR, EST AFRICAN AMERICAN: 71 mL/min/{1.73_m2} (ref >59)
GLUCOSE: 89 mg/dL (ref 65–99)
Globulin, Total: 2.1 g/dL (ref 1.5–4.5)
Potassium: 4.1 mmol/L (ref 3.5–5.2)
Sodium: 143 mmol/L (ref 134–144)
TOTAL PROTEIN: 6.2 g/dL (ref 6.0–8.5)

## 2018-01-23 LAB — THYROID PANEL WITH TSH
Free Thyroxine Index: 1.7 (ref 1.2–4.9)
T3 UPTAKE RATIO: 26 % (ref 24–39)
T4, Total: 6.7 ug/dL (ref 4.5–12.0)
TSH: 2.64 u[IU]/mL (ref 0.450–4.500)

## 2018-01-24 LAB — UA/M W/RFLX CULTURE, ROUTINE
BILIRUBIN UA: NEGATIVE
GLUCOSE, UA: NEGATIVE
KETONES UA: NEGATIVE
NITRITE UA: NEGATIVE
Protein, UA: NEGATIVE
RBC UA: NEGATIVE
SPEC GRAV UA: 1.01 (ref 1.005–1.030)
UUROB: 0.2 mg/dL (ref 0.2–1.0)
pH, UA: 7 (ref 5.0–7.5)

## 2018-01-24 LAB — MICROSCOPIC EXAMINATION: Epithelial Cells (non renal): >10 /hpf — AB (ref 0–10)

## 2018-01-24 LAB — URINE CULTURE, REFLEX

## 2018-01-30 DIAGNOSIS — M5136 Other intervertebral disc degeneration, lumbar region: Secondary | ICD-10-CM | POA: Diagnosis not present

## 2018-01-30 DIAGNOSIS — M5416 Radiculopathy, lumbar region: Secondary | ICD-10-CM | POA: Diagnosis not present

## 2018-02-19 DIAGNOSIS — H903 Sensorineural hearing loss, bilateral: Secondary | ICD-10-CM | POA: Diagnosis not present

## 2018-02-19 DIAGNOSIS — H6123 Impacted cerumen, bilateral: Secondary | ICD-10-CM | POA: Diagnosis not present

## 2018-02-20 ENCOUNTER — Other Ambulatory Visit: Payer: Self-pay | Admitting: Unknown Physician Specialty

## 2018-02-22 NOTE — Telephone Encounter (Signed)
citalopram refill Last Refill:09/25/16 # 90 3 RF Last OV: 01/22/18 PCP: Kathrine Haddock NP Pharmacy:[facility]

## 2018-02-25 DIAGNOSIS — M5136 Other intervertebral disc degeneration, lumbar region: Secondary | ICD-10-CM | POA: Diagnosis not present

## 2018-02-25 DIAGNOSIS — M5416 Radiculopathy, lumbar region: Secondary | ICD-10-CM | POA: Diagnosis not present

## 2018-03-05 ENCOUNTER — Encounter (INDEPENDENT_AMBULATORY_CARE_PROVIDER_SITE_OTHER): Payer: PPO

## 2018-03-05 ENCOUNTER — Ambulatory Visit (INDEPENDENT_AMBULATORY_CARE_PROVIDER_SITE_OTHER): Payer: PPO | Admitting: Vascular Surgery

## 2018-03-06 ENCOUNTER — Encounter (INDEPENDENT_AMBULATORY_CARE_PROVIDER_SITE_OTHER): Payer: Self-pay | Admitting: Vascular Surgery

## 2018-03-06 ENCOUNTER — Ambulatory Visit (INDEPENDENT_AMBULATORY_CARE_PROVIDER_SITE_OTHER): Payer: PPO | Admitting: Vascular Surgery

## 2018-03-06 ENCOUNTER — Ambulatory Visit (INDEPENDENT_AMBULATORY_CARE_PROVIDER_SITE_OTHER): Payer: PPO

## 2018-03-06 ENCOUNTER — Other Ambulatory Visit: Payer: Self-pay | Admitting: Unknown Physician Specialty

## 2018-03-06 VITALS — BP 154/77 | HR 55 | Resp 16 | Ht 67.0 in | Wt 204.0 lb

## 2018-03-06 DIAGNOSIS — I10 Essential (primary) hypertension: Secondary | ICD-10-CM

## 2018-03-06 DIAGNOSIS — I6523 Occlusion and stenosis of bilateral carotid arteries: Secondary | ICD-10-CM | POA: Diagnosis not present

## 2018-03-06 DIAGNOSIS — E785 Hyperlipidemia, unspecified: Secondary | ICD-10-CM

## 2018-03-06 NOTE — Progress Notes (Signed)
Subjective:    Patient ID: Mckenzie Gutierrez, female    DOB: 1922/03/27, 84 y.o.   MRN: [id number] Chief Complaint  Patient presents with  . Follow-up    Carotid U/S   Patient presents for a non-invasive study follow up for yearly carotid stenosis. The stenosis has been followed by surveillance duplexes. The patient underwent a bilateral carotid duplex scan which showed no change from the previous exam on 02/20/17. Duplex is stable at no medically significant right ICA stenosis and Left ICA stenosis (1-39%).  Bilateral vertebral arteries demonstrate antegrade flow.  Normal flow hemodynamics were seen in the bilateral subclavian arteries.  The patient denies experiencing Amaurosis Fugax, TIA like symptoms or focal motor deficits.  Patient denies any fever, nausea vomiting.  Review of Systems  Constitutional: Negative.   HENT: Negative.   Eyes: Negative.   Respiratory: Negative.   Cardiovascular:       Carotid stenosis  Gastrointestinal: Negative.   Endocrine: Negative.   Genitourinary: Negative.   Musculoskeletal: Negative.   Skin: Negative.   Allergic/Immunologic: Negative.   Neurological: Negative.   Hematological: Negative.   Psychiatric/Behavioral: Negative.       Objective:   Physical Exam  Constitutional: She is oriented to person, place, and time. She appears well-developed and well-nourished. No distress.  HENT:  Head: Normocephalic and atraumatic.  Right Ear: External ear normal.  Left Ear: External ear normal.  Eyes: Pupils are equal, round, and reactive to light. Conjunctivae and EOM are normal.  Neck: Normal range of motion.  No carotid bruits noted on exam  Cardiovascular: Normal rate, regular rhythm, normal heart sounds and intact distal pulses.  Pulses:      Radial pulses are 2+ on the right side, and 2+ on the left side.  Pulmonary/Chest: Effort normal and breath sounds normal.  Musculoskeletal: Normal range of motion. She exhibits no edema.  Neurological:  She is alert and oriented to person, place, and time.  Skin: Skin is warm and dry. She is not diaphoretic.  Psychiatric: She has a normal mood and affect. Her behavior is normal. Judgment and thought content normal.  Vitals reviewed.  BP (!) 154/77 (BP Location: Left Arm, Patient Position: Sitting)   Pulse (!) 55   Resp 16   Ht 5\' 7"  (1.702 m)   Wt 204 lb (92.5 kg)   LMP  (LMP Unknown)   BMI 31.95 kg/m   Past Medical History:  Diagnosis Date  . Anxiety   . Atrial premature beats   . Bradycardia   . Carotid stenosis   . Depression   . GERD (gastroesophageal reflux disease)   . Hyperlipidemia   . Hypertension   . Low back pain   . Osteoporosis   . Thyroid disease   . Urinary frequency    Social History   Socioeconomic History  . Marital status: Widowed    Spouse name: Not on file  . Number of children: Not on file  . Years of education: Not on file  . Highest education level: Not on file  Occupational History  . Not on file  Social Needs  . Financial resource strain: Not hard at all  . Food insecurity:    Worry: Never true    Inability: Never true  . Transportation needs:    Medical: No    Non-medical: No  Tobacco Use  . Smoking status: Never Smoker  . Smokeless tobacco: Never Used  Substance and Sexual Activity  . Alcohol use: No  Alcohol/week: 0.0 oz  . Drug use: No  . Sexual activity: Never  Lifestyle  . Physical activity:    Days per week: 0 days    Minutes per session: 0 min  . Stress: Not at all  Relationships  . Social connections:    Talks on phone: More than three times a week    Gets together: Once a week    Attends religious service: More than 4 times per year    Active member of club or organization: No    Attends meetings of clubs or organizations: Never    Relationship status: Widowed  . Intimate partner violence:    Fear of current or ex partner: No    Emotionally abused: No    Physically abused: No    Forced sexual activity: No    Other Topics Concern  . Not on file  Social History Narrative  . Not on file   Past Surgical History:  Procedure Laterality Date  . CARPAL TUNNEL RELEASE    . WRIST SURGERY     Family History  Problem Relation Age of Onset  . Heart disease Mother   . Stroke Father   . Diabetes Father   . Stroke Sister    Allergies  Allergen Reactions  . Celebrex [Celecoxib] Other (See Comments)    Hard stools  . Codeine Sulfate Other (See Comments)  . Hydrochlorothiazide   . Hytrin [Terazosin]   . Plendil [Felodipine] Diarrhea and Nausea Only  . Nitrofuran Derivatives Swelling and Rash  . Nitrofurantoin Rash and Swelling      Assessment & Plan:  Patient presents for a non-invasive study follow up for yearly carotid stenosis. The stenosis has been followed by surveillance duplexes. The patient underwent a bilateral carotid duplex scan which showed no change from the previous exam on 02/20/17. Duplex is stable at no medically significant right ICA stenosis and Left ICA stenosis (1-39%).  Bilateral vertebral arteries demonstrate antegrade flow.  Normal flow hemodynamics were seen in the bilateral subclavian arteries.  The patient denies experiencing Amaurosis Fugax, TIA like symptoms or focal motor deficits.  Patient denies any fever, nausea vomiting.  1. Bilateral carotid artery stenosis - Stable Studies reviewed with patient. Patient asymptomatic with stable duplex. No intervention at this time. Patient to return in one year for surveillance carotid duplex. Patient to remain abstinent of tobacco use. I have discussed with the patient at length the risk factors for and pathogenesis of atherosclerotic disease and encouraged a healthy diet, regular exercise regimen and blood pressure / glucose control.  Patient was instructed to contact our office in the interim with problems such as arm / leg weakness or numbness, speech / swallowing difficulty or temporary monocular blindness. The patient  expresses their understanding.  - VAS US CAROTID; Future  2. Hyperlipidemia, unspecified hyperlipidemia type - Stable Encouraged good control as its slows the progression of atherosclerotic disease  3. Essential hypertension - Stable Encouraged good control as its slows the progression of atherosclerotic disease  Current Outpatient Medications on File Prior to Visit  Medication Sig Dispense Refill  . amLODipine (NORVASC) 5 MG tablet TAKE ONE TABLET BY MOUTH EVERY DAY 90 tablet 1  . apixaban (ELIQUIS) 2.5 MG TABS tablet Take 2.5 mg by mouth 2 (two) times daily.    . beta carotene w/minerals (OCUVITE) tablet Take 1 tablet by mouth daily.    . Biotin 5000 MCG CAPS Take by mouth daily.    . citalopram (CELEXA) 20 MG tablet TAKE  ONE TABLET EVERY DAY AND 1/2 TABLETAT BEDTIME 90 tablet 3  . furosemide (LASIX) 20 MG tablet TAKE 1 TABLET BY MOUTH DAILY 90 tablet 3  . hydrocortisone (ANUSOL-HC) 2.5 % rectal cream PLACE 1 APPLICATION RECTALLY TWICE A DAY 30 g 3  . levothyroxine (SYNTHROID, LEVOTHROID) 75 MCG tablet TAKE 1 TABLET BY MOUTH DAILY 90 tablet 3  . losartan (COZAAR) 100 MG tablet TAKE ONE TABLET EVERY DAY 90 tablet 1  . Multiple Vitamin (MULTIVITAMIN) tablet Take 1 tablet by mouth daily.    . pantoprazole (PROTONIX) 20 MG tablet TAKE ONE TABLET BY MOUTH EVERY DAY 90 tablet 1  . polyethylene glycol (MIRALAX / GLYCOLAX) packet MIX ONE PACKAGE IN LIQUID AND TAKE DAILY 28 each 12   No current facility-administered medications on file prior to visit.    There are no Patient Instructions on file for this visit. No follow-ups on file.  Elsy Chiang A Nanette Wirsing, PA-C

## 2018-03-26 DIAGNOSIS — I272 Pulmonary hypertension, unspecified: Secondary | ICD-10-CM | POA: Diagnosis not present

## 2018-03-26 DIAGNOSIS — E079 Disorder of thyroid, unspecified: Secondary | ICD-10-CM | POA: Diagnosis not present

## 2018-03-26 DIAGNOSIS — R011 Cardiac murmur, unspecified: Secondary | ICD-10-CM | POA: Diagnosis not present

## 2018-03-26 DIAGNOSIS — I4891 Unspecified atrial fibrillation: Secondary | ICD-10-CM | POA: Diagnosis not present

## 2018-03-26 DIAGNOSIS — K219 Gastro-esophageal reflux disease without esophagitis: Secondary | ICD-10-CM | POA: Diagnosis not present

## 2018-03-26 DIAGNOSIS — R600 Localized edema: Secondary | ICD-10-CM | POA: Diagnosis not present

## 2018-03-26 DIAGNOSIS — I739 Peripheral vascular disease, unspecified: Secondary | ICD-10-CM | POA: Diagnosis not present

## 2018-03-26 DIAGNOSIS — E782 Mixed hyperlipidemia: Secondary | ICD-10-CM | POA: Diagnosis not present

## 2018-03-26 DIAGNOSIS — E669 Obesity, unspecified: Secondary | ICD-10-CM | POA: Diagnosis not present

## 2018-03-26 DIAGNOSIS — I10 Essential (primary) hypertension: Secondary | ICD-10-CM | POA: Diagnosis not present

## 2018-03-26 DIAGNOSIS — R0602 Shortness of breath: Secondary | ICD-10-CM | POA: Diagnosis not present

## 2018-04-16 DIAGNOSIS — R011 Cardiac murmur, unspecified: Secondary | ICD-10-CM | POA: Diagnosis not present

## 2018-04-16 DIAGNOSIS — I272 Pulmonary hypertension, unspecified: Secondary | ICD-10-CM | POA: Diagnosis not present

## 2018-06-06 ENCOUNTER — Other Ambulatory Visit: Payer: Self-pay | Admitting: Unknown Physician Specialty

## 2018-06-06 NOTE — Telephone Encounter (Signed)
Requested Prescriptions  Pending Prescriptions Disp Refills  . levothyroxine (SYNTHROID, LEVOTHROID) 75 MCG tablet [Pharmacy Med Name: LEVOTHYROXINE SODIUM 75 MCG TAB] 90 tablet 0    Sig: TAKE ONE TABLET BY MOUTH EVERY DAY     Endocrinology:  Hypothyroid Agents Failed - 06/06/2018  1:26 PM      Failed - TSH needs to be rechecked within 3 months after an abnormal result. Refill until TSH is due.      Passed - TSH in normal range and within 360 days    TSH  Date Value Ref Range Status  01/22/2018 2.640 0.450 - 4.500 uIU/mL Final         Passed - Valid encounter within last 12 months    Recent Outpatient Visits          4 months ago Essential hypertension   [facility] Kathrine Haddock, NP   6 months ago Upper respiratory tract infection, unspecified type   [facility] Merrie Roof [place], [place]   10 months ago Hypothyroidism, unspecified type   [facility] Kathrine Haddock, NP   1 year ago Hypothyroidism, unspecified type   [facility] Kathrine Haddock, NP   1 year ago Cough   [facility] Kathrine Haddock, NP      Future Appointments            In 1 month Orene Desanctis, Lilia Argue, PA-C [facility], [facility]         . losartan (COZAAR) 100 MG tablet [Pharmacy Med Name: LOSARTAN POTASSIUM 100 MG TAB] 90 tablet 1    Sig: TAKE 1 TABLET DAILY     Cardiovascular:  Angiotensin Receptor Blockers Failed - 06/06/2018  1:26 PM      Failed - Last BP in normal range    BP Readings from Last 1 Encounters:  03/06/18 (!) 154/77         Passed - Cr in normal range and within 180 days    Creatinine, Ser  Date Value Ref Range Status  01/22/2018 0.82 0.57 - 1.00 mg/dL Final         Passed - K in normal range and within 180 days    Potassium  Date Value Ref Range Status  01/22/2018 4.1 3.5 - 5.2 mmol/L Final         Passed - Patient is not pregnant      Passed - Valid encounter within last 6 months    Recent  Outpatient Visits          4 months ago Essential hypertension   [place], NP   6 months ago Upper respiratory tract infection, unspecified type   [facility], [place] [address] [place], [place]   10 months ago Hypothyroidism, unspecified type   [facility] Kathrine Haddock, NP   1 year ago Hypothyroidism, unspecified type   [facility] Kathrine Haddock, NP   1 year ago Cough   [place], NP      Future Appointments            In 1 month Orene Desanctis, Lilia Argue, [place], [facility]          Last TSH 4 months ago 2.640

## 2018-06-06 NOTE — Telephone Encounter (Signed)
Requested Prescriptions  Pending Prescriptions Disp Refills  . furosemide (LASIX) 20 MG tablet [Pharmacy Med Name: FUROSEMIDE 20 MG TAB] 90 tablet 0    Sig: TAKE ONE TABLET BY MOUTH EVERY DAY     Cardiovascular:  Diuretics - Loop Failed - 06/06/2018  1:28 PM      Failed - Last BP in normal range    BP Readings from Last 1 Encounters:  03/06/18 (!) 154/77         Passed - K in normal range and within 360 days    Potassium  Date Value Ref Range Status  01/22/2018 4.1 3.5 - 5.2 mmol/L Final         Passed - Ca in normal range and within 360 days    Calcium  Date Value Ref Range Status  01/22/2018 9.2 8.7 - 10.3 mg/dL Final         Passed - Na in normal range and within 360 days    Sodium  Date Value Ref Range Status  01/22/2018 143 134 - 144 mmol/L Final         Passed - Cr in normal range and within 360 days    Creatinine, Ser  Date Value Ref Range Status  01/22/2018 0.82 0.57 - 1.00 mg/dL Final         Passed - Valid encounter within last 6 months    Recent Outpatient Visits          4 months ago Essential hypertension   [place], NP   6 months ago Upper respiratory tract infection, unspecified type   [facility], [place], [place]   10 months ago Hypothyroidism, unspecified type   [facility] Kathrine Haddock, NP   1 year ago Hypothyroidism, unspecified type   [facility] Kathrine Haddock, NP   1 year ago Cough   [facility] Kathrine Haddock, NP      Future Appointments            In 45 month Orene Desanctis, Lilia Argue, [place], [facility]         . pantoprazole (PROTONIX) 20 MG tablet [Pharmacy Med Name: PANTOPRAZOLE SODIUM 20 MG TAB] 90 tablet 1    Sig: TAKE 1 TABLET DAILY     Gastroenterology: Proton Pump Inhibitors Passed - 06/06/2018  1:28 PM      Passed - Valid encounter within last 12 months    Recent Outpatient Visits          4 months ago Essential  hypertension   [facility] Kathrine Haddock, NP   6 months ago Upper respiratory tract infection, unspecified type   [facility] Merrie Roof [place], [place]   10 months ago Hypothyroidism, unspecified type   [facility] Kathrine Haddock, NP   1 year ago Hypothyroidism, unspecified type   [facility] Kathrine Haddock, NP   1 year ago Cough   [place], NP      Future Appointments            In 1 month Orene Desanctis, Lilia Argue, [place], [place]

## 2018-06-17 ENCOUNTER — Ambulatory Visit (INDEPENDENT_AMBULATORY_CARE_PROVIDER_SITE_OTHER): Payer: PPO

## 2018-06-17 ENCOUNTER — Encounter: Payer: Self-pay | Admitting: Family Medicine

## 2018-06-17 DIAGNOSIS — Z23 Encounter for immunization: Secondary | ICD-10-CM

## 2018-07-04 ENCOUNTER — Ambulatory Visit (INDEPENDENT_AMBULATORY_CARE_PROVIDER_SITE_OTHER): Payer: PPO | Admitting: Family Medicine

## 2018-07-04 ENCOUNTER — Other Ambulatory Visit: Payer: Self-pay

## 2018-07-04 ENCOUNTER — Encounter: Payer: Self-pay | Admitting: Family Medicine

## 2018-07-04 VITALS — BP 121/71 | HR 84 | Temp 98.3°F | Ht 67.0 in | Wt 202.3 lb

## 2018-07-04 DIAGNOSIS — N390 Urinary tract infection, site not specified: Secondary | ICD-10-CM

## 2018-07-04 DIAGNOSIS — R350 Frequency of micturition: Secondary | ICD-10-CM | POA: Diagnosis not present

## 2018-07-04 MED ORDER — SULFAMETHOXAZOLE-TRIMETHOPRIM 800-160 MG PO TABS: 1.0000 | ORAL_TABLET | Freq: Two times a day (BID) | ORAL | 0 refills | Status: DC

## 2018-07-04 NOTE — Progress Notes (Signed)
BP 121/71   Pulse 84   Temp 98.3 F (36.8 C) (Oral)   Ht 5\' 7"  (1.702 m)   Wt 202 lb 4.8 oz (91.8 kg)   LMP  (LMP Unknown)   SpO2 94%   BMI 31.68 kg/m    Subjective:    Patient ID: Mckenzie Gutierrez, female    DOB: [birth year], 84 y.o.   MRN: [id number]  HPI: Mckenzie Gutierrez is a 84 y.o. female  Chief Complaint  Patient presents with  . Urinary Frequency    x several weeks per patient   Here today with 2 weeks of urinary frequency and some intermittent twinges in lower abdomen. Denies fevers, hematuria, dysuria, back pain. Has not been taking anything for sxs. Has not had any issues with UTIs for years.   Relevant past medical, surgical, family and social history reviewed and updated as indicated. Interim medical history since our last visit reviewed. Allergies and medications reviewed and updated.  Review of Systems  Per HPI unless specifically indicated above     Objective:    BP 121/71   Pulse 84   Temp 98.3 F (36.8 C) (Oral)   Ht 5\' 7"  (1.702 m)   Wt 202 lb 4.8 oz (91.8 kg)   LMP  (LMP Unknown)   SpO2 94%   BMI 31.68 kg/m   Wt Readings from Last 3 Encounters:  07/04/18 202 lb 4.8 oz (91.8 kg)  03/06/18 204 lb (92.5 kg)  01/22/18 203 lb 12.8 oz (92.4 kg)    Physical Exam  Constitutional: She is oriented to person, place, and time. She appears well-developed and well-nourished.  HENT:  Head: Atraumatic.  Eyes: Conjunctivae and EOM are normal.  Neck: Normal range of motion. Neck supple.  Cardiovascular: Normal rate and regular rhythm.  Pulmonary/Chest: Effort normal and breath sounds normal.  Abdominal: Soft. Bowel sounds are normal. She exhibits no mass. There is no tenderness. There is no guarding.  Musculoskeletal: Normal range of motion.  Neurological: She is alert and oriented to person, place, and time.  Skin: Skin is warm and dry.  Psychiatric: She has a normal mood and affect. Her behavior is normal.  Nursing note and vitals  reviewed.   Results for orders placed or performed in visit on 01/22/18  Microscopic Examination  Result Value Ref Range   WBC, UA 0-5 0 - 5 /hpf   RBC, UA 0-2 0 - 2 /hpf   Epithelial Cells (non renal) >10 (A) 0 - 10 /hpf   Renal Epithel, UA 0-10 (A) None seen /hpf   Bacteria, UA Few None seen/Few  Urine Culture, Reflex  Result Value Ref Range   Urine Culture, Routine Final report    Organism ID, Bacteria Comment   Comprehensive metabolic panel  Result Value Ref Range   Glucose 89 65 - 99 mg/dL   BUN 9 (L) 10 - 36 mg/dL   Creatinine, Ser 0.82 0.57 - 1.00 mg/dL   GFR calc non Af Amer 62 >59 mL/min/1.73   GFR calc Af Amer 71 >59 mL/min/1.73   BUN/Creatinine Ratio 11 (L) 12 - 28   Sodium 143 134 - 144 mmol/L   Potassium 4.1 3.5 - 5.2 mmol/L   Chloride 103 96 - 106 mmol/L   CO2 24 20 - 29 mmol/L   Calcium 9.2 8.7 - 10.3 mg/dL   Total Protein 6.2 6.0 - 8.5 g/dL   Albumin 4.1 3.2 - 4.6 g/dL   Globulin, Total 2.1 1.5 - 4.5 g/dL  Albumin/Globulin Ratio 2.0 1.2 - 2.2   Bilirubin Total 0.6 0.0 - 1.2 mg/dL   Alkaline Phosphatase 68 39 - 117 IU/L   AST 19 0 - 40 IU/L   ALT 11 0 - 32 IU/L  UA/M w/rflx Culture, Routine  Result Value Ref Range   Specific Gravity, UA 1.010 1.005 - 1.030   pH, UA 7.0 5.0 - 7.5   Color, UA Yellow Yellow   Appearance Ur Hazy (A) Clear   Leukocytes, UA 1+ (A) Negative   Protein, UA Negative Negative/Trace   Glucose, UA Negative Negative   Ketones, UA Negative Negative   RBC, UA Negative Negative   Bilirubin, UA Negative Negative   Urobilinogen, Ur 0.2 0.2 - 1.0 mg/dL   Nitrite, UA Negative Negative   Microscopic Examination See below:    Urinalysis Reflex Comment   Thyroid Panel With TSH  Result Value Ref Range   TSH 2.640 0.450 - 4.500 uIU/mL   T4, Total 6.7 4.5 - 12.0 ug/dL   T3 Uptake Ratio 26 24 - 39 %   Free Thyroxine Index 1.7 1.2 - 4.9  CBC with Differential/Platelet  Result Value Ref Range   WBC 6.7 3.4 - 10.8 x10E3/uL   RBC 3.83  3.77 - 5.28 x10E6/uL   Hemoglobin 12.3 11.1 - 15.9 g/dL   Hematocrit 36.4 34.0 - 46.6 %   MCV 95 79 - 97 fL   MCH 32.1 26.6 - 33.0 pg   MCHC 33.8 31.5 - 35.7 g/dL   RDW 16.0 (H) 12.3 - 15.4 %   Platelets 175 150 - 450 x10E3/uL   Neutrophils 45 Not Estab. %   Lymphs 41 Not Estab. %   Monocytes 12 Not Estab. %   Eos 1 Not Estab. %   Basos 1 Not Estab. %   Neutrophils Absolute 3.0 1.4 - 7.0 x10E3/uL   Lymphocytes Absolute 2.7 0.7 - 3.1 x10E3/uL   Monocytes Absolute 0.8 0.1 - 0.9 x10E3/uL   EOS (ABSOLUTE) 0.1 0.0 - 0.4 x10E3/uL   Basophils Absolute 0.1 0.0 - 0.2 x10E3/uL   Immature Granulocytes 0 Not Estab. %   Immature Grans (Abs) 0.0 0.0 - 0.1 x10E3/uL      Assessment & Plan:   Problem List Items Addressed This Visit    None    Visit Diagnoses    Acute lower UTI    -  Primary   Tx with bactrim, push fluids. Await cx. Follow up if worsening or not improving   Relevant Medications   sulfamethoxazole-trimethoprim (BACTRIM DS,SEPTRA DS) 800-160 MG tablet   Other Relevant Orders   UA/M w/rflx Culture, Routine   Urine Culture       Follow up plan: Return if symptoms worsen or fail to improve.

## 2018-07-04 NOTE — Patient Instructions (Signed)
Follow up as needed

## 2018-07-05 LAB — UA/M W/RFLX CULTURE, ROUTINE
BILIRUBIN UA: NEGATIVE
Glucose, UA: NEGATIVE
KETONES UA: NEGATIVE
Nitrite, UA: POSITIVE — AB
PH UA: 7 (ref 5.0–7.5)
SPEC GRAV UA: 1.015 (ref 1.005–1.030)
Urobilinogen, Ur: 0.2 mg/dL (ref 0.2–1.0)

## 2018-07-05 LAB — MICROSCOPIC EXAMINATION

## 2018-07-06 LAB — URINE CULTURE

## 2018-07-18 DIAGNOSIS — H353132 Nonexudative age-related macular degeneration, bilateral, intermediate dry stage: Secondary | ICD-10-CM | POA: Diagnosis not present

## 2018-07-24 DIAGNOSIS — H903 Sensorineural hearing loss, bilateral: Secondary | ICD-10-CM | POA: Diagnosis not present

## 2018-07-24 DIAGNOSIS — H6123 Impacted cerumen, bilateral: Secondary | ICD-10-CM | POA: Diagnosis not present

## 2018-07-29 ENCOUNTER — Ambulatory Visit: Payer: PPO | Admitting: Family Medicine

## 2018-07-29 ENCOUNTER — Ambulatory Visit: Payer: PPO | Admitting: Unknown Physician Specialty

## 2018-07-31 ENCOUNTER — Ambulatory Visit (INDEPENDENT_AMBULATORY_CARE_PROVIDER_SITE_OTHER): Payer: PPO | Admitting: Family Medicine

## 2018-07-31 ENCOUNTER — Encounter: Payer: Self-pay | Admitting: Family Medicine

## 2018-07-31 VITALS — BP 122/68 | HR 70 | Temp 97.6°F | Ht 67.0 in | Wt 201.4 lb

## 2018-07-31 DIAGNOSIS — E039 Hypothyroidism, unspecified: Secondary | ICD-10-CM | POA: Diagnosis not present

## 2018-07-31 DIAGNOSIS — E785 Hyperlipidemia, unspecified: Secondary | ICD-10-CM

## 2018-07-31 DIAGNOSIS — N390 Urinary tract infection, site not specified: Secondary | ICD-10-CM | POA: Diagnosis not present

## 2018-07-31 DIAGNOSIS — I10 Essential (primary) hypertension: Secondary | ICD-10-CM

## 2018-07-31 DIAGNOSIS — R350 Frequency of micturition: Secondary | ICD-10-CM | POA: Diagnosis not present

## 2018-07-31 MED ORDER — CIPROFLOXACIN HCL 250 MG PO TABS: 250.0000 mg | ORAL_TABLET | Freq: Two times a day (BID) | ORAL | 0 refills | Status: DC

## 2018-07-31 NOTE — Progress Notes (Signed)
BP 122/68   Pulse 70   Temp 97.6 F (36.4 C)   Ht 5\' 7"  (1.702 m)   Wt 201 lb 6 oz (91.3 kg)   LMP  (LMP Unknown)   SpO2 95%   BMI 31.54 kg/m    Subjective:    Patient ID: Mckenzie Gutierrez, female    DOB: 10/01/1921, 84 y.o.   MRN: [id number]  HPI: Mckenzie Gutierrez is a 84 y.o. female  Chief Complaint  Patient presents with  . Urinary Tract Infection  . Hypertension  . Hypothyroidism  . Hyperlipidemia   Here today for 6 month f/u.   BPs typically 130s/70s when checked at home. Taking medication faithfully without side effects. Denies CP, SOB, dizziness, HAs.   Taking synthroid daily for hypothyroidism, last TSH normal within the last year. Asymptomatic other than some alopecia which is stable for her.   Diet controlled hyperlipidemia. Feels she does well with her diet, tries to stay active as much as possible. Denies CP, SOB, claudication.   Hx of urge incontinence, now having frequent UTIs. Recently treated with bactrim for a UTI with temporary resolution. Now having recurrence of frequency and urgency. Denies N/V/D, abdominal pain, fevers, back pain. Not trying anything OTC for sxs.   Past Medical History:  Diagnosis Date  . Anxiety   . Atrial premature beats   . Bradycardia   . Carotid stenosis   . Depression   . GERD (gastroesophageal reflux disease)   . Hyperlipidemia   . Hypertension   . Low back pain   . Osteoporosis   . Thyroid disease   . Urinary frequency    Social History   Socioeconomic History  . Marital status: Widowed    Spouse name: Not on file  . Number of children: Not on file  . Years of education: Not on file  . Highest education level: Not on file  Occupational History  . Not on file  Social Needs  . Financial resource strain: Not hard at all  . Food insecurity:    Worry: Never true    Inability: Never true  . Transportation needs:    Medical: No    Non-medical: No  Tobacco Use  . Smoking status: Never Smoker  . Smokeless  tobacco: Never Used  Substance and Sexual Activity  . Alcohol use: No    Alcohol/week: 0.0 standard drinks  . Drug use: No  . Sexual activity: Never  Lifestyle  . Physical activity:    Days per week: 0 days    Minutes per session: 0 min  . Stress: Not at all  Relationships  . Social connections:    Talks on phone: More than three times a week    Gets together: Once a week    Attends religious service: More than 4 times per year    Active member of club or organization: No    Attends meetings of clubs or organizations: Never    Relationship status: Widowed  . Intimate partner violence:    Fear of current or ex partner: No    Emotionally abused: No    Physically abused: No    Forced sexual activity: No  Other Topics Concern  . Not on file  Social History Narrative  . Not on file    Relevant past medical, surgical, family and social history reviewed and updated as indicated. Interim medical history since our last visit reviewed. Allergies and medications reviewed and updated.  Review of Systems  Per  HPI unless specifically indicated above     Objective:    BP 122/68   Pulse 70   Temp 97.6 F (36.4 C)   Ht 5\' 7"  (1.702 m)   Wt 201 lb 6 oz (91.3 kg)   LMP  (LMP Unknown)   SpO2 95%   BMI 31.54 kg/m   Wt Readings from Last 3 Encounters:  07/31/18 201 lb 6 oz (91.3 kg)  07/04/18 202 lb 4.8 oz (91.8 kg)  03/06/18 204 lb (92.5 kg)    Physical Exam  Constitutional: She appears well-developed and well-nourished. No distress.  HENT:  Head: Atraumatic.  Eyes: Conjunctivae and EOM are normal.  Neck: Normal range of motion.  Cardiovascular: Normal rate and normal heart sounds.  Pulmonary/Chest: Effort normal and breath sounds normal.  Abdominal: Soft. Bowel sounds are normal. There is no tenderness. There is no guarding.  Musculoskeletal: Normal range of motion. She exhibits no tenderness (No CVA ttp b/l).  Neurological: She is alert.  Skin: Skin is warm and dry.    Psychiatric: She has a normal mood and affect. Her behavior is normal.  Nursing note and vitals reviewed.   Results for orders placed or performed in visit on 07/31/18  Microscopic Examination  Result Value Ref Range   WBC, UA 11-30 (A) 0 - 5 /hpf   RBC, UA 0-2 0 - 2 /hpf   Epithelial Cells (non renal) 0-10 0 - 10 /hpf   Renal Epithel, UA 0-10 (A) None seen /hpf   Bacteria, UA Moderate (A) None seen/Few  Urine Culture, Reflex  Result Value Ref Range   Urine Culture, Routine Final report (A)    Organism ID, Bacteria Escherichia coli (A)    Antimicrobial Susceptibility Comment   Comprehensive metabolic panel  Result Value Ref Range   Glucose 107 (H) 65 - 99 mg/dL   BUN 11 10 - 36 mg/dL   Creatinine, Ser 0.75 0.57 - 1.00 mg/dL   GFR calc non Af Amer 68 >59 mL/min/1.73   GFR calc Af Amer 79 >59 mL/min/1.73   BUN/Creatinine Ratio 15 12 - 28   Sodium 142 134 - 144 mmol/L   Potassium 3.9 3.5 - 5.2 mmol/L   Chloride 102 96 - 106 mmol/L   CO2 25 20 - 29 mmol/L   Calcium 9.5 8.7 - 10.3 mg/dL   Total Protein 6.8 6.0 - 8.5 g/dL   Albumin 4.5 3.2 - 4.6 g/dL   Globulin, Total 2.3 1.5 - 4.5 g/dL   Albumin/Globulin Ratio 2.0 1.2 - 2.2   Bilirubin Total 0.9 0.0 - 1.2 mg/dL   Alkaline Phosphatase 62 39 - 117 IU/L   AST 21 0 - 40 IU/L   ALT 11 0 - 32 IU/L  Lipid Panel w/o Chol/HDL Ratio  Result Value Ref Range   Cholesterol, Total 158 100 - 199 mg/dL   Triglycerides 89 0 - 149 mg/dL   HDL 51 >39 mg/dL   VLDL Cholesterol Cal 18 5 - 40 mg/dL   LDL Calculated 89 0 - 99 mg/dL  UA/M w/rflx Culture, Routine  Result Value Ref Range   Specific Gravity, UA 1.015 1.005 - 1.030   pH, UA 7.0 5.0 - 7.5   Color, UA Straw Yellow   Appearance Ur Turbid (A) Clear   Leukocytes, UA 3+ (A) Negative   Protein, UA Negative Negative/Trace   Glucose, UA Negative Negative   Ketones, UA Negative Negative   RBC, UA 1+ (A) Negative   Bilirubin, UA Negative Negative  Urobilinogen, Ur 0.2 0.2 - 1.0 mg/dL    Nitrite, UA Negative Negative   Microscopic Examination See below:    Urinalysis Reflex Comment       Assessment & Plan:   Problem List Items Addressed This Visit      Cardiovascular and Mediastinum   Hypertension    Stable and WNL, continue current regimen      Relevant Orders   Comprehensive metabolic panel (Completed)     Endocrine   Hypothyroid    Stable, continue current synthroid dose        Other   HLD (hyperlipidemia)    Recheck lipids, currently diet controlled. Continue good lifestyle modifications      Relevant Orders   Lipid Panel w/o Chol/HDL Ratio (Completed)    Other Visit Diagnoses    Acute lower UTI    -  Primary   Tx with cipro, await culture results. Push fluids, void fully and frequently. Cranberry juice or tablets recommended   Relevant Orders   UA/M w/rflx Culture, Routine (Completed)       Follow up plan: Return for CPE, AWV with Tiffany.

## 2018-08-01 LAB — COMPREHENSIVE METABOLIC PANEL
ALBUMIN: 4.5 g/dL (ref 3.2–4.6)
ALK PHOS: 62 IU/L (ref 39–117)
ALT: 11 IU/L (ref 0–32)
AST: 21 IU/L (ref 0–40)
Albumin/Globulin Ratio: 2 (ref 1.2–2.2)
BILIRUBIN TOTAL: 0.9 mg/dL (ref 0.0–1.2)
BUN/Creatinine Ratio: 15 (ref 12–28)
BUN: 11 mg/dL (ref 10–36)
CHLORIDE: 102 mmol/L (ref 96–106)
CO2: 25 mmol/L (ref 20–29)
Calcium: 9.5 mg/dL (ref 8.7–10.3)
Creatinine, Ser: 0.75 mg/dL (ref 0.57–1.00)
GFR calc Af Amer: 79 mL/min/{1.73_m2} (ref >59)
GFR calc non Af Amer: 68 mL/min/{1.73_m2} (ref >59)
GLUCOSE: 107 mg/dL — AB (ref 65–99)
Globulin, Total: 2.3 g/dL (ref 1.5–4.5)
Potassium: 3.9 mmol/L (ref 3.5–5.2)
Sodium: 142 mmol/L (ref 134–144)
Total Protein: 6.8 g/dL (ref 6.0–8.5)

## 2018-08-01 LAB — LIPID PANEL W/O CHOL/HDL RATIO
CHOLESTEROL TOTAL: 158 mg/dL (ref 100–199)
HDL: 51 mg/dL (ref >39)
LDL CALC: 89 mg/dL (ref 0–99)
Triglycerides: 89 mg/dL (ref 0–149)
VLDL CHOLESTEROL CAL: 18 mg/dL (ref 5–40)

## 2018-08-04 LAB — MICROSCOPIC EXAMINATION

## 2018-08-04 LAB — UA/M W/RFLX CULTURE, ROUTINE
BILIRUBIN UA: NEGATIVE
Glucose, UA: NEGATIVE
KETONES UA: NEGATIVE
Nitrite, UA: NEGATIVE
Protein, UA: NEGATIVE
Specific Gravity, UA: 1.015 (ref 1.005–1.030)
Urobilinogen, Ur: 0.2 mg/dL (ref 0.2–1.0)
pH, UA: 7 (ref 5.0–7.5)

## 2018-08-04 LAB — URINE CULTURE, REFLEX

## 2018-08-05 NOTE — Assessment & Plan Note (Signed)
Stable, continue current synthroid dose

## 2018-08-05 NOTE — Patient Instructions (Signed)
Follow up for CPE 

## 2018-08-05 NOTE — Assessment & Plan Note (Signed)
Recheck lipids, currently diet controlled. Continue good lifestyle modifications

## 2018-08-05 NOTE — Assessment & Plan Note (Signed)
Stable and WNL, continue current regimen 

## 2018-08-08 ENCOUNTER — Other Ambulatory Visit: Payer: Self-pay

## 2018-08-08 ENCOUNTER — Encounter: Payer: Self-pay | Admitting: Family Medicine

## 2018-08-08 ENCOUNTER — Ambulatory Visit (INDEPENDENT_AMBULATORY_CARE_PROVIDER_SITE_OTHER): Payer: PPO | Admitting: Family Medicine

## 2018-08-08 VITALS — BP 151/78 | HR 80 | Temp 97.9°F | Ht 67.0 in | Wt 203.0 lb

## 2018-08-08 DIAGNOSIS — R05 Cough: Secondary | ICD-10-CM

## 2018-08-08 DIAGNOSIS — R059 Cough, unspecified: Secondary | ICD-10-CM

## 2018-08-08 NOTE — Progress Notes (Signed)
BP (!) 151/78   Pulse 80   Temp 97.9 F (36.6 C) (Oral)   Ht 5\' 7"  (1.702 m)   Wt 203 lb (92.1 kg)   LMP  (LMP Unknown)   SpO2 97%   BMI 31.79 kg/m    Subjective:    Patient ID: Mckenzie Gutierrez, female    DOB: 1922/02/04, 84 y.o.   MRN: [id number]  HPI: Mckenzie Gutierrez is a 84 y.o. female  Chief Complaint  Patient presents with  . Cough    pt states has had chest congestion and spitting out clear mucus  for a couple of weeks  . Wheezing    pt states has taken Rx tessalon for cough   Over a week of productive hacking cough, now some chest tightness, SOB with exertion. Feels some better the past day or so. Still taking tessalon perles which seem to help. Denies fevers, chills, sore throat, nasal congestion. Several sick contact recently. No hx of pulmonary disease or smoking.   Relevant past medical, surgical, family and social history reviewed and updated as indicated. Interim medical history since our last visit reviewed. Allergies and medications reviewed and updated.  Review of Systems  Per HPI unless specifically indicated above     Objective:    BP (!) 151/78   Pulse 80   Temp 97.9 F (36.6 C) (Oral)   Ht 5\' 7"  (1.702 m)   Wt 203 lb (92.1 kg)   LMP  (LMP Unknown)   SpO2 97%   BMI 31.79 kg/m   Wt Readings from Last 3 Encounters:  08/08/18 203 lb (92.1 kg)  07/31/18 201 lb 6 oz (91.3 kg)  07/04/18 202 lb 4.8 oz (91.8 kg)    Physical Exam  Constitutional: She is oriented to person, place, and time. She appears well-developed and well-nourished. No distress.  HENT:  Head: Atraumatic.  Right Ear: External ear normal.  Left Ear: External ear normal.  Nose: Nose normal.  Oropharynx erythematous  Eyes: Pupils are equal, round, and reactive to light. Conjunctivae and EOM are normal.  Neck: Normal range of motion. Neck supple.  Cardiovascular: Normal rate and regular rhythm.  Pulmonary/Chest: Effort normal and breath sounds normal. No respiratory  distress. She has no wheezes. She has no rales.  Musculoskeletal: Normal range of motion.  Neurological: She is alert and oriented to person, place, and time.  Skin: Skin is warm and dry.  Psychiatric: She has a normal mood and affect. Her behavior is normal.  Nursing note and vitals reviewed.   Results for orders placed or performed in visit on 07/31/18  Microscopic Examination  Result Value Ref Range   WBC, UA 11-30 (A) 0 - 5 /hpf   RBC, UA 0-2 0 - 2 /hpf   Epithelial Cells (non renal) 0-10 0 - 10 /hpf   Renal Epithel, UA 0-10 (A) None seen /hpf   Bacteria, UA Moderate (A) None seen/Few  Urine Culture, Reflex  Result Value Ref Range   Urine Culture, Routine Final report (A)    Organism ID, Bacteria Escherichia coli (A)    Antimicrobial Susceptibility Comment   Comprehensive metabolic panel  Result Value Ref Range   Glucose 107 (H) 65 - 99 mg/dL   BUN 11 10 - 36 mg/dL   Creatinine, Ser 0.75 0.57 - 1.00 mg/dL   GFR calc non Af Amer 68 >59 mL/min/1.73   GFR calc Af Amer 79 >59 mL/min/1.73   BUN/Creatinine Ratio 15 12 - 28  Sodium 142 134 - 144 mmol/L   Potassium 3.9 3.5 - 5.2 mmol/L   Chloride 102 96 - 106 mmol/L   CO2 25 20 - 29 mmol/L   Calcium 9.5 8.7 - 10.3 mg/dL   Total Protein 6.8 6.0 - 8.5 g/dL   Albumin 4.5 3.2 - 4.6 g/dL   Globulin, Total 2.3 1.5 - 4.5 g/dL   Albumin/Globulin Ratio 2.0 1.2 - 2.2   Bilirubin Total 0.9 0.0 - 1.2 mg/dL   Alkaline Phosphatase 62 39 - 117 IU/L   AST 21 0 - 40 IU/L   ALT 11 0 - 32 IU/L  Lipid Panel w/o Chol/HDL Ratio  Result Value Ref Range   Cholesterol, Total 158 100 - 199 mg/dL   Triglycerides 89 0 - 149 mg/dL   HDL 51 >39 mg/dL   VLDL Cholesterol Cal 18 5 - 40 mg/dL   LDL Calculated 89 0 - 99 mg/dL  UA/M w/rflx Culture, Routine  Result Value Ref Range   Specific Gravity, UA 1.015 1.005 - 1.030   pH, UA 7.0 5.0 - 7.5   Color, UA Straw Yellow   Appearance Ur Turbid (A) Clear   Leukocytes, UA 3+ (A) Negative   Protein, UA  Negative Negative/Trace   Glucose, UA Negative Negative   Ketones, UA Negative Negative   RBC, UA 1+ (A) Negative   Bilirubin, UA Negative Negative   Urobilinogen, Ur 0.2 0.2 - 1.0 mg/dL   Nitrite, UA Negative Negative   Microscopic Examination See below:    Urinalysis Reflex Comment       Assessment & Plan:   Problem List Items Addressed This Visit    None    Visit Diagnoses    Cough    -  Primary   Improving, no evidence of bacterial infection. Asmanex sample given with instructions. Continue tessalon and add mucinex, supportive care. F/u if not improving       Follow up plan: Return for as scheduled.

## 2018-08-08 NOTE — Patient Instructions (Signed)
Plain mucinex twice daily to help thin out mucus

## 2018-09-06 ENCOUNTER — Other Ambulatory Visit: Payer: Self-pay | Admitting: Family Medicine

## 2018-09-06 ENCOUNTER — Other Ambulatory Visit: Payer: Self-pay | Admitting: Unknown Physician Specialty

## 2018-10-01 DIAGNOSIS — K219 Gastro-esophageal reflux disease without esophagitis: Secondary | ICD-10-CM | POA: Diagnosis not present

## 2018-10-01 DIAGNOSIS — E079 Disorder of thyroid, unspecified: Secondary | ICD-10-CM | POA: Diagnosis not present

## 2018-10-01 DIAGNOSIS — I272 Pulmonary hypertension, unspecified: Secondary | ICD-10-CM | POA: Diagnosis not present

## 2018-10-01 DIAGNOSIS — R0602 Shortness of breath: Secondary | ICD-10-CM | POA: Diagnosis not present

## 2018-10-01 DIAGNOSIS — I739 Peripheral vascular disease, unspecified: Secondary | ICD-10-CM | POA: Diagnosis not present

## 2018-10-01 DIAGNOSIS — R600 Localized edema: Secondary | ICD-10-CM | POA: Diagnosis not present

## 2018-10-01 DIAGNOSIS — E782 Mixed hyperlipidemia: Secondary | ICD-10-CM | POA: Diagnosis not present

## 2018-10-01 DIAGNOSIS — I10 Essential (primary) hypertension: Secondary | ICD-10-CM | POA: Diagnosis not present

## 2018-10-01 DIAGNOSIS — I4891 Unspecified atrial fibrillation: Secondary | ICD-10-CM | POA: Diagnosis not present

## 2018-10-01 DIAGNOSIS — E669 Obesity, unspecified: Secondary | ICD-10-CM | POA: Diagnosis not present

## 2018-10-01 DIAGNOSIS — R011 Cardiac murmur, unspecified: Secondary | ICD-10-CM | POA: Diagnosis not present

## 2018-10-01 DIAGNOSIS — R531 Weakness: Secondary | ICD-10-CM | POA: Diagnosis not present

## 2018-10-24 ENCOUNTER — Other Ambulatory Visit: Payer: Self-pay | Admitting: Internal Medicine

## 2018-11-06 DIAGNOSIS — K1121 Acute sialoadenitis: Secondary | ICD-10-CM | POA: Diagnosis not present

## 2018-11-06 DIAGNOSIS — H6123 Impacted cerumen, bilateral: Secondary | ICD-10-CM | POA: Diagnosis not present

## 2018-12-06 ENCOUNTER — Other Ambulatory Visit: Payer: Self-pay | Admitting: Family Medicine

## 2018-12-06 ENCOUNTER — Other Ambulatory Visit: Payer: Self-pay | Admitting: Unknown Physician Specialty

## 2018-12-17 ENCOUNTER — Other Ambulatory Visit: Payer: Self-pay | Admitting: Family Medicine

## 2018-12-17 NOTE — Telephone Encounter (Signed)
Requested Prescriptions  Pending Prescriptions Disp Refills  . hydrocortisone (ANUSOL-HC) 2.5 % rectal cream [Pharmacy Med Name: HYDROCORTISONE 2.5% RECTAL CREAM GM] 30 g 1    Sig: PLACE 1 APPLICATION RECTALLY TWICE A DAY     Off-Protocol Failed - 12/17/2018  2:22 PM      Failed - Medication not assigned to a protocol, review manually.      Passed - Valid encounter within last 12 months    Recent Outpatient Visits          4 months ago Cough   [facility] Merrie Roof [place], [place]   4 months ago Acute lower UTI   [place], [place], [place]   5 months ago Acute lower UTI   [place], [place], [place]   10 months ago Essential hypertension   [place] Kathrine Haddock, NP   1 year ago Upper respiratory tract infection, unspecified type   [facility], Lilia Argue, [place]      Future Appointments            In 1 month Orene Desanctis, Lilia Argue, [place], [place]

## 2019-01-28 ENCOUNTER — Ambulatory Visit: Payer: Self-pay | Admitting: Family Medicine

## 2019-01-30 ENCOUNTER — Ambulatory Visit: Payer: Self-pay | Admitting: Family Medicine

## 2019-02-05 ENCOUNTER — Other Ambulatory Visit: Payer: Self-pay | Admitting: Family Medicine

## 2019-02-05 ENCOUNTER — Other Ambulatory Visit: Payer: Self-pay | Admitting: Unknown Physician Specialty

## 2019-02-05 NOTE — Telephone Encounter (Signed)
Requested medication (s) are due for refill today: Yes  Requested medication (s) are on the active medication list: Yes  Last refill:  12/2018  Future visit scheduled: Yes  Notes to clinic:  No current TSH, unable to refill per protocol     Requested Prescriptions  Pending Prescriptions Disp Refills   levothyroxine (SYNTHROID) 75 MCG tablet [Pharmacy Med Name: LEVOTHYROXINE SODIUM 75 MCG TAB] 60 tablet 0    Sig: TAKE ONE TABLET BY MOUTH EVERY DAY     Endocrinology:  Hypothyroid Agents Failed - 02/05/2019 12:26 PM      Failed - TSH needs to be rechecked within 3 months after an abnormal result. Refill until TSH is due.      Failed - TSH in normal range and within 360 days    TSH  Date Value Ref Range Status  01/22/2018 2.640 0.450 - 4.500 uIU/mL Final         Passed - Valid encounter within last 12 months    Recent Outpatient Visits          6 months ago Cough   [facility] Merrie Roof [place], [place]   6 months ago Acute lower UTI   [place], [place], [place]   7 months ago Acute lower UTI   [facility], [place], [place]   1 year ago Essential hypertension   [place] Kathrine Haddock, NP   1 year ago Upper respiratory tract infection, unspecified type   [facility], Lilia Argue, [place]      Future Appointments            Tomorrow Orene Desanctis, [place], PA-C [place], [facility]          losartan (COZAAR) 100 MG tablet [Pharmacy Med Name: LOSARTAN POTASSIUM 100 MG TAB] 60 tablet 0    Sig: TAKE ONE TABLET BY MOUTH EVERY DAY     Cardiovascular:  Angiotensin Receptor Blockers Failed - 02/05/2019 12:26 PM      Failed - Cr in normal range and within 180 days    Creatinine, Ser  Date Value Ref Range Status  07/31/2018 0.75 0.57 - 1.00 mg/dL Final         Failed - K in normal range and within 180 days    Potassium  Date Value Ref Range Status  07/31/2018 3.9  3.5 - 5.2 mmol/L Final         Failed - Last BP in normal range    BP Readings from Last 1 Encounters:  08/08/18 (!) 151/78         Failed - Valid encounter within last 6 months    Recent Outpatient Visits          6 months ago Cough   [facility] Volney American, [place]   6 months ago Acute lower UTI   [facility] Vol[place], [place]   7 months ago Acute lower UTI   [facility], Lilia Argue, [place]   1 year ago Essential hypertension   [place] Kathrine Haddock, NP   1 year ago Upper respiratory tract infection, unspecified type   [facility], Lilia Argue, [place], Lilia Argue, [place], [place] - Patient is not pregnant  furosemide (LASIX) 20 MG tablet [Pharmacy Med Name: FUROSEMIDE 20 MG TAB] 60 tablet 0    Sig: TAKE ONE TABLET BY MOUTH EVERY DAY     Cardiovascular:  Diuretics - Loop Failed - 02/05/2019 12:26 PM      Failed - Last BP in normal range    BP Readings from Last 1 Encounters:  08/08/18 (!) 151/78         Failed - Valid encounter within last 6 months    Recent Outpatient Visits          6 months ago Cough   [facility] Volney American, [place]   6 months ago Acute lower UTI   [place], [place], [place]   7 months ago Acute lower UTI   [facility], [place], [place]   1 year ago Essential hypertension   [place] Kathrine Haddock, NP   1 year ago Upper respiratory tract infection, unspecified type   [facility], Lilia Argue, [place]      Future Appointments            Tomorrow Orene Desanctis, [place], PA-C [facility], [facility]           Passed - K in normal range and within 360 days    Potassium  Date Value Ref Range Status  07/31/2018 3.9 3.5 - 5.2  mmol/L Final         Passed - Ca in normal range and within 360 days    Calcium  Date Value Ref Range Status  07/31/2018 9.5 8.7 - 10.3 mg/dL Final         Passed - Na in normal range and within 360 days    Sodium  Date Value Ref Range Status  07/31/2018 142 134 - 144 mmol/L Final         Passed - Cr in normal range and within 360 days    Creatinine, Ser  Date Value Ref Range Status  07/31/2018 0.75 0.57 - 1.00 mg/dL Final

## 2019-02-06 ENCOUNTER — Encounter: Payer: Self-pay | Admitting: Family Medicine

## 2019-02-06 ENCOUNTER — Ambulatory Visit (INDEPENDENT_AMBULATORY_CARE_PROVIDER_SITE_OTHER): Payer: PPO | Admitting: Family Medicine

## 2019-02-06 ENCOUNTER — Other Ambulatory Visit: Payer: Self-pay

## 2019-02-06 VITALS — BP 118/75 | HR 96 | Temp 98.5°F | Ht 67.0 in | Wt 200.0 lb

## 2019-02-06 DIAGNOSIS — I4891 Unspecified atrial fibrillation: Secondary | ICD-10-CM | POA: Diagnosis not present

## 2019-02-06 DIAGNOSIS — M545 Low back pain, unspecified: Secondary | ICD-10-CM

## 2019-02-06 DIAGNOSIS — N390 Urinary tract infection, site not specified: Secondary | ICD-10-CM

## 2019-02-06 DIAGNOSIS — G8929 Other chronic pain: Secondary | ICD-10-CM | POA: Diagnosis not present

## 2019-02-06 DIAGNOSIS — R350 Frequency of micturition: Secondary | ICD-10-CM | POA: Diagnosis not present

## 2019-02-06 DIAGNOSIS — E039 Hypothyroidism, unspecified: Secondary | ICD-10-CM | POA: Diagnosis not present

## 2019-02-06 DIAGNOSIS — F325 Major depressive disorder, single episode, in full remission: Secondary | ICD-10-CM

## 2019-02-06 DIAGNOSIS — I10 Essential (primary) hypertension: Secondary | ICD-10-CM

## 2019-02-06 DIAGNOSIS — E785 Hyperlipidemia, unspecified: Secondary | ICD-10-CM | POA: Diagnosis not present

## 2019-02-06 MED ORDER — SULFAMETHOXAZOLE-TRIMETHOPRIM 800-160 MG PO TABS: 1.0000 | ORAL_TABLET | Freq: Two times a day (BID) | ORAL | 0 refills | Status: DC

## 2019-02-06 MED ORDER — PANTOPRAZOLE SODIUM 20 MG PO TBEC: 20.0000 mg | DELAYED_RELEASE_TABLET | Freq: Every day | ORAL | 1 refills | Status: DC

## 2019-02-06 MED ORDER — LEVOTHYROXINE SODIUM 75 MCG PO TABS: 75.0000 ug | ORAL_TABLET | Freq: Every day | ORAL | 1 refills | Status: DC

## 2019-02-06 MED ORDER — FUROSEMIDE 20 MG PO TABS: 20.0000 mg | ORAL_TABLET | Freq: Every day | ORAL | 1 refills | Status: DC

## 2019-02-06 MED ORDER — AMLODIPINE BESYLATE 5 MG PO TABS: 5.0000 mg | ORAL_TABLET | Freq: Every day | ORAL | 1 refills | Status: DC

## 2019-02-06 MED ORDER — LOSARTAN POTASSIUM 100 MG PO TABS: 100.0000 mg | ORAL_TABLET | Freq: Every day | ORAL | 1 refills | Status: DC

## 2019-02-06 MED ORDER — CITALOPRAM HYDROBROMIDE 20 MG PO TABS: 20.0000 mg | ORAL_TABLET | Freq: Every day | ORAL | 1 refills | Status: DC

## 2019-02-06 NOTE — Telephone Encounter (Signed)
appt scheduled for today

## 2019-02-06 NOTE — Progress Notes (Signed)
BP 118/75   Pulse 96   Temp 98.5 F (36.9 C) (Oral)   Ht 5\' 7"  (1.702 m)   Wt 200 lb (90.7 kg)   LMP  (LMP Unknown)   SpO2 97%   BMI 31.32 kg/m    Subjective:    Patient ID: Mckenzie Gutierrez, female    DOB: 1922-08-27, 85 y.o.   MRN: [id number]  HPI: Mckenzie Gutierrez is a 85 y.o. female  Chief Complaint  Patient presents with  . Hypertension    52m f/u  . Hypothyroidism  . Hyperlipidemia   Here today for 6 month f/u.   Chronic low back pain anytime she's up and doing things around the house. Relieved with sitting in her chair. Has had shots in her back in the past which she states only helped for a day or so. Uses a heating pad around the house and tylenol which seem to help some.   Having some intermittent urinary frequency, urine odor and discoloration the past few weeks. Denies fevers, flank pain, abdominal pain, N/V/D. Not trying anything for sxs. States she does not drink as much water as she should.   Moods under good control with celexa, feels things are in a good place there. Tolerating well, no side effects. Denies SI/HI.   Followed by Cardiology for atrial fibrillation, pulmonary HTN, CAD, and valvular disease. Doing well on current regimen. Denies recent anginal sxs, DOE, dizziness, syncope. Taking medications faithfully without side effects.   Taking synthroid for hypothyroidism without issue. Asymptomatic.   Depression screen [facility] 2/9 07/31/2018 01/22/2018 07/20/2017  Decreased Interest 0 0 0  Down, Depressed, Hopeless 0 0 0  PHQ - 2 Score 0 0 0  Altered sleeping 0 0 0  Tired, decreased energy 0 3 1  Change in appetite 0 0 3  Feeling bad or failure about yourself  0 0 0  Trouble concentrating 0 0 0  Moving slowly or fidgety/restless 0 0 0  Suicidal thoughts 0 0 0  PHQ-9 Score 0 3 4  Difficult doing work/chores Not difficult at all - Not difficult at all    Relevant past medical, surgical, family and social history reviewed and updated as indicated.  Interim medical history since our last visit reviewed. Allergies and medications reviewed and updated.  Review of Systems  Per HPI unless specifically indicated above     Objective:    BP 118/75   Pulse 96   Temp 98.5 F (36.9 C) (Oral)   Ht 5\' 7"  (1.702 m)   Wt 200 lb (90.7 kg)   LMP  (LMP Unknown)   SpO2 97%   BMI 31.32 kg/m   Wt Readings from Last 3 Encounters:  02/06/19 200 lb (90.7 kg)  08/08/18 203 lb (92.1 kg)  07/31/18 201 lb 6 oz (91.3 kg)    Physical Exam Vitals signs and nursing note reviewed.  Constitutional:      Appearance: Normal appearance. She is not ill-appearing.  HENT:     Head: Atraumatic.  Eyes:     Extraocular Movements: Extraocular movements intact.     Conjunctiva/sclera: Conjunctivae normal.  Neck:     Musculoskeletal: Normal range of motion and neck supple.  Cardiovascular:     Rate and Rhythm: Normal rate.     Pulses: Normal pulses.  Pulmonary:     Effort: Pulmonary effort is normal.     Breath sounds: Normal breath sounds.  Musculoskeletal: Normal range of motion.  Skin:    General: Skin  is warm and dry.  Neurological:     Mental Status: She is alert and oriented to person, place, and time.  Psychiatric:        Mood and Affect: Mood normal.        Thought Content: Thought content normal.        Judgment: Judgment normal.     Results for orders placed or performed in visit on 02/06/19  Microscopic Examination  Result Value Ref Range   WBC, UA >30 (A) 0 - 5 /hpf   RBC 0-2 0 - 2 /hpf   Epithelial Cells (non renal) 0-10 0 - 10 /hpf   Bacteria, UA Many (A) None seen/Few  Urine Culture, Reflex  Result Value Ref Range   Urine Culture, Routine Preliminary report (A)    Organism ID, Bacteria Gram negative rods (A)   Lipid Panel w/o Chol/HDL Ratio  Result Value Ref Range   Cholesterol, Total 163 100 - 199 mg/dL   Triglycerides 130 0 - 149 mg/dL   HDL 50 >39 mg/dL   VLDL Cholesterol Cal 26 5 - 40 mg/dL   LDL Calculated 87 0 - 99  mg/dL  Comprehensive metabolic panel  Result Value Ref Range   Glucose 104 (H) 65 - 99 mg/dL   BUN 11 10 - 36 mg/dL   Creatinine, Ser 0.91 0.57 - 1.00 mg/dL   GFR calc non Af Amer 54 (L) >59 mL/min/1.73   GFR calc Af Amer 62 >59 mL/min/1.73   BUN/Creatinine Ratio 12 12 - 28   Sodium 141 134 - 144 mmol/L   Potassium 4.2 3.5 - 5.2 mmol/L   Chloride 101 96 - 106 mmol/L   CO2 25 20 - 29 mmol/L   Calcium 9.9 8.7 - 10.3 mg/dL   Total Protein 6.6 6.0 - 8.5 g/dL   Albumin 4.3 3.5 - 4.6 g/dL   Globulin, Total 2.3 1.5 - 4.5 g/dL   Albumin/Globulin Ratio 1.9 1.2 - 2.2   Bilirubin Total 0.8 0.0 - 1.2 mg/dL   Alkaline Phosphatase 60 39 - 117 IU/L   AST 21 0 - 40 IU/L   ALT 11 0 - 32 IU/L  TSH  Result Value Ref Range   TSH 3.180 0.450 - 4.500 uIU/mL  UA/M w/rflx Culture, Routine  Result Value Ref Range   Specific Gravity, UA 1.015 1.005 - 1.030   pH, UA 7.5 5.0 - 7.5   Color, UA Yellow Yellow   Appearance Ur Cloudy (A) Clear   Leukocytes,UA 3+ (A) Negative   Protein,UA Negative Negative/Trace   Glucose, UA Negative Negative   Ketones, UA Negative Negative   RBC, UA Trace (A) Negative   Bilirubin, UA Negative Negative   Urobilinogen, Ur 0.2 0.2 - 1.0 mg/dL   Nitrite, UA Negative Negative   Microscopic Examination See below:    Urinalysis Reflex Comment       Assessment & Plan:   Problem List Items Addressed This Visit      Cardiovascular and Mediastinum   Hypertension - Primary    Stable and WNL, continue current regimen.       Relevant Medications   amLODipine (NORVASC) 5 MG tablet   furosemide (LASIX) 20 MG tablet   losartan (COZAAR) 100 MG tablet   Other Relevant Orders   Comprehensive metabolic panel (Completed)   New onset a-fib (HCC)    Stable on anticoagulants, rate well controlled. Followed by Cardiology      Relevant Medications   amLODipine (NORVASC) 5 MG tablet  furosemide (LASIX) 20 MG tablet   losartan (COZAAR) 100 MG tablet     Endocrine    Hypothyroid    Recheck TSH, continue current regimen      Relevant Medications   levothyroxine (SYNTHROID) 75 MCG tablet   Other Relevant Orders   TSH (Completed)     Other   HLD (hyperlipidemia)    Recheck lipids, adjust as needed. Continue current regimen      Relevant Medications   amLODipine (NORVASC) 5 MG tablet   furosemide (LASIX) 20 MG tablet   losartan (COZAAR) 100 MG tablet   Other Relevant Orders   Lipid Panel w/o Chol/HDL Ratio (Completed)   Major depression in complete remission (HCC)    Continues to do well on celexa, continue current regimen      Relevant Medications   citalopram (CELEXA) 20 MG tablet   Low back pain    Not interested in going back to specialist for injections at this time. Continue heating pad, tylenol prn for now       Other Visit Diagnoses    Acute lower UTI       U/A + for UTI. Tx with bactrim, increase fluid intake. Await cx. F/u if not improving   Relevant Medications   sulfamethoxazole-trimethoprim (BACTRIM DS) 800-160 MG tablet   Other Relevant Orders   UA/M w/rflx Culture, Routine (Completed)       Follow up plan: Return in about 6 months (around 08/08/2019) for CPE.

## 2019-02-07 ENCOUNTER — Encounter: Payer: Self-pay | Admitting: Family Medicine

## 2019-02-07 LAB — LIPID PANEL W/O CHOL/HDL RATIO
Cholesterol, Total: 163 mg/dL (ref 100–199)
HDL: 50 mg/dL (ref >39)
LDL Calculated: 87 mg/dL (ref 0–99)
Triglycerides: 130 mg/dL (ref 0–149)
VLDL Cholesterol Cal: 26 mg/dL (ref 5–40)

## 2019-02-07 LAB — COMPREHENSIVE METABOLIC PANEL
ALT: 11 IU/L (ref 0–32)
AST: 21 IU/L (ref 0–40)
Albumin/Globulin Ratio: 1.9 (ref 1.2–2.2)
Albumin: 4.3 g/dL (ref 3.5–4.6)
Alkaline Phosphatase: 60 IU/L (ref 39–117)
BUN/Creatinine Ratio: 12 (ref 12–28)
BUN: 11 mg/dL (ref 10–36)
Bilirubin Total: 0.8 mg/dL (ref 0.0–1.2)
CO2: 25 mmol/L (ref 20–29)
Calcium: 9.9 mg/dL (ref 8.7–10.3)
Chloride: 101 mmol/L (ref 96–106)
Creatinine, Ser: 0.91 mg/dL (ref 0.57–1.00)
GFR calc Af Amer: 62 mL/min/{1.73_m2} (ref >59)
GFR calc non Af Amer: 54 mL/min/{1.73_m2} — ABNORMAL LOW (ref >59)
Globulin, Total: 2.3 g/dL (ref 1.5–4.5)
Glucose: 104 mg/dL — ABNORMAL HIGH (ref 65–99)
Potassium: 4.2 mmol/L (ref 3.5–5.2)
Sodium: 141 mmol/L (ref 134–144)
Total Protein: 6.6 g/dL (ref 6.0–8.5)

## 2019-02-07 LAB — TSH: TSH: 3.18 u[IU]/mL (ref 0.450–4.500)

## 2019-02-10 LAB — UA/M W/RFLX CULTURE, ROUTINE
Bilirubin, UA: NEGATIVE
Glucose, UA: NEGATIVE
Ketones, UA: NEGATIVE
Nitrite, UA: NEGATIVE
Protein,UA: NEGATIVE
Specific Gravity, UA: 1.015 (ref 1.005–1.030)
Urobilinogen, Ur: 0.2 mg/dL (ref 0.2–1.0)
pH, UA: 7.5 (ref 5.0–7.5)

## 2019-02-10 LAB — MICROSCOPIC EXAMINATION: WBC, UA: >30 /hpf — AB (ref 0–5)

## 2019-02-10 LAB — URINE CULTURE, REFLEX

## 2019-02-10 NOTE — Assessment & Plan Note (Signed)
Stable and WNL, continue current regimen 

## 2019-02-10 NOTE — Assessment & Plan Note (Signed)
Continues to do well on celexa, continue current regimen

## 2019-02-10 NOTE — Assessment & Plan Note (Addendum)
Not interested in going back to specialist for injections at this time. Continue heating pad, tylenol prn for now

## 2019-02-10 NOTE — Assessment & Plan Note (Signed)
Recheck lipids, adjust as needed. Continue current regimen 

## 2019-02-10 NOTE — Assessment & Plan Note (Signed)
Stable on anticoagulants, rate well controlled. Followed by Cardiology

## 2019-02-10 NOTE — Assessment & Plan Note (Signed)
Recheck TSH, continue current regimen

## 2019-02-13 ENCOUNTER — Telehealth: Payer: Self-pay | Admitting: Family Medicine

## 2019-02-13 NOTE — Telephone Encounter (Signed)
Letter was sent last week, ok to read patient the results  Copied from [place] [id number]. Topic: Quick Communication - Lab Results (Clinic Use ONLY) >> Feb 12, 2019 10:50 AM Lennox Solders wrote: Pt would like blood work results

## 2019-02-14 NOTE — Telephone Encounter (Signed)
Patient notified. Patient stated she got the letter yesterday.

## 2019-03-11 ENCOUNTER — Encounter (INDEPENDENT_AMBULATORY_CARE_PROVIDER_SITE_OTHER): Payer: Self-pay | Admitting: Vascular Surgery

## 2019-03-11 ENCOUNTER — Ambulatory Visit (INDEPENDENT_AMBULATORY_CARE_PROVIDER_SITE_OTHER): Payer: PPO

## 2019-03-11 ENCOUNTER — Other Ambulatory Visit: Payer: Self-pay

## 2019-03-11 ENCOUNTER — Ambulatory Visit (INDEPENDENT_AMBULATORY_CARE_PROVIDER_SITE_OTHER): Payer: PPO | Admitting: Vascular Surgery

## 2019-03-11 VITALS — BP 145/84 | HR 84 | Resp 12 | Ht 67.0 in | Wt 200.0 lb

## 2019-03-11 DIAGNOSIS — I6523 Occlusion and stenosis of bilateral carotid arteries: Secondary | ICD-10-CM | POA: Diagnosis not present

## 2019-03-11 DIAGNOSIS — I10 Essential (primary) hypertension: Secondary | ICD-10-CM | POA: Diagnosis not present

## 2019-03-11 DIAGNOSIS — I4891 Unspecified atrial fibrillation: Secondary | ICD-10-CM

## 2019-03-11 DIAGNOSIS — Z79899 Other long term (current) drug therapy: Secondary | ICD-10-CM | POA: Diagnosis not present

## 2019-03-11 DIAGNOSIS — Z7901 Long term (current) use of anticoagulants: Secondary | ICD-10-CM | POA: Diagnosis not present

## 2019-03-11 NOTE — Patient Instructions (Signed)
Carotid Artery Disease  The carotid arteries are arteries on both sides of the neck. They carry blood to the brain, face, and neck. Carotid artery disease happens when these arteries become smaller (narrow) or get blocked. If these arteries become smaller or get blocked, you are more likely to have a stroke or a warning stroke (transient ischemic attack). Follow these instructions at home:  Take over-the-counter and prescription medicines only as told by your doctor.  Make sure you understand all instructions about your medicines. Do not stop taking your medicines without talking to your doctor first.  Follow your doctor's diet instructions. It is important to follow a healthy diet. ? Eat foods that include plenty of: ? Fresh fruits. ? Vegetables. ? Lean meats. ? Avoid these foods: ? Foods that are high in fat. ? Foods that are high in salt (sodium). ? Foods that are fried. ? Foods that are processed. ? Foods that have few good nutrients (poor nutritional value).  Keep a healthy weight.  Stay active. Get at least 30 minutes of activity every day.  Do not smoke.  Limit alcohol use to: ? No more than 2 drinks a day for men. ? No more than 1 drink a day for women who are not pregnant.  Do not use illegal drugs.  Keep all follow-up visits as told by your doctor. This is important. Contact a doctor if: Get help right away if:  You have any symptoms of stroke or TIA. The acronym BEFAST is an easy way to remember the main warning signs of stroke. ? B = Balance problems. Signs include dizziness, sudden trouble walking, or loss of balance ? E = Eye problems. This includes trouble seeing or a sudden change in vision. ? F = Face changes. This includes sudden weakness or numbness of the face, or the face or eyelid drooping to one side. ? A = Arm weakness or numbness. This happens suddenly and usually on one side of the body. ? S = Speech problems. This includes trouble speaking or  trouble understanding. ? T = Time. Time to call 911 or seek emergency care. Do not wait to see if symptoms go away. Make note of the time your symptoms started.  Other signs of stroke may include: ? A sudden, severe headache with no known cause. ? Feeling sick to your stomach (nauseous) or throwing up (vomiting). ? Seizure. Call your local emergency services (911 in [place] Do notdrive yourself to the clinic or hospital. Summary  The carotid arteries are arteries on both sides of the neck.  If these arteries get smaller or get blocked, you are more likely to have a stroke or a warning stroke (transient ischemic attack).  Take over-the-counter and prescription medicines only as told by your doctor.  Keep all follow-up visits as told by your doctor. This is important. This information is not intended to replace advice given to you by your health care provider. Make sure you discuss any questions you have with your health care provider. Document Released: 08/07/2012 Document Revised: 08/16/2017 Document Reviewed: 08/16/2017 Elsevier Patient Education  2020 Elsevier Inc.  

## 2019-03-11 NOTE — Progress Notes (Signed)
MRN : [id number]  Mckenzie Gutierrez is a 85 y.o. (10-Dec-1921) female who presents with chief complaint of  Chief Complaint  Patient presents with  . Follow-up  .  History of Present Illness: Patient returns in follow up of her carotid disease. She is doing well today.  No focal neurologic symptoms. Specifically, the patient denies amaurosis fugax, speech or swallowing difficulties, or arm or leg weakness or numbness.  She is over 9 years status post right carotid endarterectomy.  Her duplex today shows a widely patent right carotid endarterectomy and stable, mild, less than 40% left ICA stenosis.  Current Outpatient Medications  Medication Sig Dispense Refill  . amLODipine (NORVASC) 5 MG tablet Take 1 tablet (5 mg total) by mouth daily. 90 tablet 1  . apixaban (ELIQUIS) 2.5 MG TABS tablet Take 2.5 mg by mouth 2 (two) times daily.    . beta carotene w/minerals (OCUVITE) tablet Take 1 tablet by mouth daily.    . Biotin 5000 MCG CAPS Take by mouth daily.    . citalopram (CELEXA) 20 MG tablet Take 1 tablet (20 mg total) by mouth daily. 90 tablet 1  . furosemide (LASIX) 20 MG tablet Take 1 tablet (20 mg total) by mouth daily. 90 tablet 1  . levothyroxine (SYNTHROID) 75 MCG tablet Take 1 tablet (75 mcg total) by mouth daily. 90 tablet 1  . losartan (COZAAR) 100 MG tablet Take 1 tablet (100 mg total) by mouth daily. 90 tablet 1  . Multiple Vitamin (MULTIVITAMIN) tablet Take 1 tablet by mouth daily.    . pantoprazole (PROTONIX) 20 MG tablet Take 1 tablet (20 mg total) by mouth daily. 90 tablet 1  . polyethylene glycol (MIRALAX / GLYCOLAX) packet MIX ONE PACKAGE IN LIQUID AND TAKE DAILY 28 each 12  . hydrocortisone (ANUSOL-HC) 2.5 % rectal cream PLACE 1 APPLICATION RECTALLY TWICE A DAY 30 g 1  . Polyethylene Glycol 3350 GRAN by Does not apply route.    . sulfamethoxazole-trimethoprim (BACTRIM DS) 800-160 MG tablet Take 1 tablet by mouth 2 (two) times daily. 6 tablet 0   No current  facility-administered medications for this visit.     Past Medical History:  Diagnosis Date  . Anxiety   . Atrial premature beats   . Bradycardia   . Carotid stenosis   . Depression   . GERD (gastroesophageal reflux disease)   . Hyperlipidemia   . Hypertension   . Low back pain   . Osteoporosis   . Thyroid disease   . Urinary frequency     Past Surgical History:  Procedure Laterality Date  . CARPAL TUNNEL RELEASE    . WRIST SURGERY      Social History  Substance Use Topics  . Smoking status: Never Smoker  . Smokeless tobacco: Never Used  . Alcohol use No  Widowed  Family History      Family History  Problem Relation Age of Onset  . Heart disease Mother   . Stroke Father   . Diabetes Father   . Stroke Sister           Allergies  Allergen Reactions  . Celebrex [Celecoxib] Other (See Comments)    Hard stools  . Codeine Sulfate Other (See Comments)  . Hydrochlorothiazide   . Hytrin [Terazosin]   . Plendil [Felodipine] Diarrhea and Nausea Only  . Nitrofuran Derivatives Swelling and Rash  . Nitrofurantoin Rash and Swelling     REVIEW OF SYSTEMS (Negative unless checked)  Constitutional: [] ?Weight loss  [] ?  Fever  [] ?Chills Cardiac: [] ?Chest pain   [] ?Chest pressure   [x] ?Palpitations   [] ?Shortness of breath when laying flat   [] ?Shortness of breath at rest   [] ?Shortness of breath with exertion. Vascular:  [] ?Pain in legs with walking   [] ?Pain in legs at rest   [] ?Pain in legs when laying flat   [] ?Claudication   [] ?Pain in feet when walking  [] ?Pain in feet at rest  [] ?Pain in feet when laying flat   [] ?History of DVT   [] ?Phlebitis   [] ?Swelling in legs   [] ?Varicose veins   [] ?Non-healing ulcers Pulmonary:   [] ?Uses home oxygen   [] ?Productive cough   [] ?Hemoptysis   [] ?Wheeze  [] ?COPD   [] ?Asthma Neurologic:  [x] ?Dizziness  [] ?Blackouts   [] ?Seizures   [] ?History of stroke   [] ?History of TIA  [] ?Aphasia   [] ?Temporary blindness    [] ?Dysphagia   [] ?Weakness or numbness in arms   [] ?Weakness or numbness in legs Musculoskeletal:  [x] ?Arthritis   [] ?Joint swelling   [] ?Joint pain   [] ?Low back pain Hematologic:  [] ?Easy bruising  [] ?Easy bleeding   [] ?Hypercoagulable state   [] ?Anemic   Gastrointestinal:  [] ?Blood in stool   [] ?Vomiting blood  [] ?Gastroesophageal reflux/heartburn   [] ?Abdominal pain Genitourinary:  [] ?Chronic kidney disease   [] ?Difficult urination  [] ?Frequent urination  [] ?Burning with urination   [] ?Hematuria Skin:  [] ?Rashes   [] ?Ulcers   [] ?Wounds Psychological:  [x] ?History of anxiety   [] ? History of major depression.    Physical Examination  Vitals:   03/11/19 1508  BP: (!) 145/84  Pulse: 84  Resp: 12  Weight: 200 lb (90.7 kg)  Height: 5\' 7"  (1.702 m)   Body mass index is 31.32 kg/m. Gen:  WD/WN, NAD. Appears younger than stated age. Head: [place]/AT, No temporalis wasting. Ear/Nose/Throat: Hearing grossly intact, nares w/o erythema or drainage, trachea midline Eyes: Conjunctiva clear. Sclera non-icteric Neck: Supple.  Soft right carotid bruit  Pulmonary:  Good air movement, equal and clear to auscultation bilaterally.  Cardiac: irregular Vascular:  Vessel Right Left  Radial Palpable Palpable               Musculoskeletal: M/S 5/5 throughout.  No deformity or atrophy. Trace LE edema. Neurologic: CN 2-12 intact. Sensation grossly intact in extremities.  Symmetrical.  Speech is fluent. Motor exam as listed above. Psychiatric: Judgment intact, Mood & affect appropriate for pt's clinical situation. Dermatologic: No rashes or ulcers noted.  No cellulitis or open wounds.      CBC Lab Results  Component Value Date   WBC 6.7 01/22/2018   HGB 12.3 01/22/2018   HCT 36.4 01/22/2018   MCV 95 01/22/2018   PLT 175 01/22/2018    BMET    Component Value Date/Time   NA 141 02/06/2019 1010   K 4.2 02/06/2019 1010   CL 101 02/06/2019 1010   CO2 25 02/06/2019 1010   GLUCOSE 104 (H)  02/06/2019 1010   GLUCOSE 119 (H) 03/01/2016 1116   BUN 11 02/06/2019 1010   CREATININE 0.91 02/06/2019 1010   CALCIUM 9.9 02/06/2019 1010   GFRNONAA 54 (L) 02/06/2019 1010   GFRAA 62 02/06/2019 1010   CrCl cannot be calculated (Patient's most recent lab result is older than the maximum 21 days allowed.).  COAG Lab Results  Component Value Date   INR 1.01 03/01/2016    Radiology No results found.    Assessment/Plan Hypertension blood pressure control important in reducing the progression of atherosclerotic disease. On appropriate oral medications.  Atrial fibrillation ([place]) On anticoagulation  Carotid stenosis Her duplex today shows a widely patent right carotid endarterectomy and stable, mild, less than 40% left ICA stenosis. Overall doing well.  No changes to her medical regimen and no role for intervention at this time.  Recheck in 1 year.    Leotis Pain, MD  03/11/2019 4:03 PM    This note was created with Dragon medical transcription system.  Any errors from dictation are purely unintentional

## 2019-03-11 NOTE — Assessment & Plan Note (Signed)
Her duplex today shows a widely patent right carotid endarterectomy and stable, mild, less than 40% left ICA stenosis. Overall doing well.  No changes to her medical regimen and no role for intervention at this time.  Recheck in 1 year.

## 2019-03-11 NOTE — Assessment & Plan Note (Signed)
blood pressure control important in reducing the progression of atherosclerotic disease. On appropriate oral medications.  

## 2019-03-11 NOTE — Assessment & Plan Note (Signed)
On anticoagulation 

## 2019-03-27 ENCOUNTER — Ambulatory Visit: Payer: Self-pay | Admitting: *Deleted

## 2019-03-27 ENCOUNTER — Encounter: Payer: Self-pay | Admitting: Family Medicine

## 2019-03-27 ENCOUNTER — Ambulatory Visit (INDEPENDENT_AMBULATORY_CARE_PROVIDER_SITE_OTHER): Payer: PPO | Admitting: Family Medicine

## 2019-03-27 ENCOUNTER — Other Ambulatory Visit: Payer: Self-pay

## 2019-03-27 VITALS — BP 151/76 | HR 65 | Temp 98.3°F | Ht 67.0 in | Wt 200.0 lb

## 2019-03-27 DIAGNOSIS — K649 Unspecified hemorrhoids: Secondary | ICD-10-CM | POA: Diagnosis not present

## 2019-03-27 DIAGNOSIS — Z7901 Long term (current) use of anticoagulants: Secondary | ICD-10-CM | POA: Diagnosis not present

## 2019-03-27 DIAGNOSIS — K625 Hemorrhage of anus and rectum: Secondary | ICD-10-CM | POA: Diagnosis not present

## 2019-03-27 LAB — CBC WITH DIFFERENTIAL/PLATELET
Hematocrit: 38.8 % (ref 34.0–46.6)
Hemoglobin: 13.2 g/dL (ref 11.1–15.9)
Lymphocytes Absolute: 3.4 10*3/uL — ABNORMAL HIGH (ref 0.7–3.1)
Lymphs: 46 %
MCH: 34.3 pg — ABNORMAL HIGH (ref 26.6–33.0)
MCHC: 34 g/dL (ref 31.5–35.7)
MCV: 101 fL — ABNORMAL HIGH (ref 79–97)
MID (Absolute): 0.8 10*3/uL (ref 0.1–1.6)
MID: 12 %
Neutrophils Absolute: 3.1 10*3/uL (ref 1.4–7.0)
Neutrophils: 42 %
Platelets: 160 10*3/uL (ref 150–450)
RBC: 3.85 x10E6/uL (ref 3.77–5.28)
RDW: 14.7 % (ref 11.7–15.4)
WBC: 7.3 10*3/uL (ref 3.4–10.8)

## 2019-03-27 MED ORDER — HYDROCORTISONE ACETATE 25 MG RE SUPP: 25.0000 mg | Freq: Two times a day (BID) | RECTAL | 0 refills | Status: DC | PRN

## 2019-03-27 NOTE — Telephone Encounter (Signed)
Patient is calling to report that she started rectal bleeding yesterday with BM. She reports no constipation and she states no bleeding without BM- but she did have good amount of bleed when she went 4 times yesterday and once this morning. Patient is not having SE from the bleeding yet- but is taking blood thinning medication. Call to office for appointment.  Reason for Disposition . Taking Coumadin (warfarin) or other strong blood thinner, or known bleeding disorder (e.g., thrombocytopenia)  Answer Assessment - Initial Assessment Questions 1. APPEARANCE of BLOOD: "What color is it?" "Is it passed separately, on the surface of the stool, or mixed in with the stool?"      Bright red, hard to tell 2. AMOUNT: "How much blood was passed?"      4 times yesterday- a cup full 3. FREQUENCY: "How many times has blood been passed with the stools?"      4 times yesterday and small amount today 4. ONSET: "When was the blood first seen in the stools?" (Days or weeks)      yesterday 5. DIARRHEA: "Is there also some diarrhea?" If so, ask: "How many diarrhea stools were passed in past 24 hours?"      Loose stool- patient has been using mira lax 6. CONSTIPATION: "Do you have constipation?" If so, "How bad is it?"     no 7. RECURRENT SYMPTOMS: "Have you had blood in your stools before?" If so, ask: "When was the last time?" and "What happened that time?"      Yes- episodic- this is the worst she has had- patient will skip months 8. BLOOD THINNERS: "Do you take any blood thinners?" (e.g., Coumadin/warfarin, Pradaxa/dabigatran, aspirin)     Eliquis 9. OTHER SYMPTOMS: "Do you have any other symptoms?"  (e.g., abdominal pain, vomiting, dizziness, fever)     no 10. PREGNANCY: "Is there any chance you are pregnant?" "When was your last menstrual period?"       n/a  Protocols used: RECTAL BLEEDING-A-AH

## 2019-03-27 NOTE — Progress Notes (Signed)
BP (!) 151/76 (BP Location: Left Arm, Patient Position: Sitting, Cuff Size: Normal)   Pulse 65   Temp 98.3 F (36.8 C) (Oral)   Ht 5\' 7"  (1.702 m)   Wt 200 lb (90.7 kg)   LMP  (LMP Unknown)   SpO2 97%   BMI 31.32 kg/m    Subjective:    Patient ID: Mckenzie Gutierrez, female    DOB: 02/18/1922, 85 y.o.   MRN: [id number]  HPI: Mckenzie Gutierrez is a 85 y.o. female  Chief Complaint  Patient presents with  . Rectal Bleeding    Ongoing 2 days. Denies pain, constipation, or diarrhea.    Intermittent bright red bleeding with BMs x 2 days. This is the first episode since about 4 months ago. Has been dealing with this issue off and on for years with known hx of significant hemorrhoids. Denies constipation, diarrhea, rectal pain, abdominal pain, weakness, CP, SOB. Has been using prn anusol cream rarely for flares which seems to help. Of note, patient is on eliquis for atrial fibrillation.   Relevant past medical, surgical, family and social history reviewed and updated as indicated. Interim medical history since our last visit reviewed. Allergies and medications reviewed and updated.  Review of Systems  Per HPI unless specifically indicated above     Objective:    BP (!) 151/76 (BP Location: Left Arm, Patient Position: Sitting, Cuff Size: Normal)   Pulse 65   Temp 98.3 F (36.8 C) (Oral)   Ht 5\' 7"  (1.702 m)   Wt 200 lb (90.7 kg)   LMP  (LMP Unknown)   SpO2 97%   BMI 31.32 kg/m   Wt Readings from Last 3 Encounters:  03/27/19 200 lb (90.7 kg)  03/11/19 200 lb (90.7 kg)  02/06/19 200 lb (90.7 kg)    Physical Exam Vitals signs and nursing note reviewed.  Constitutional:      Appearance: Normal appearance. She is not ill-appearing.  HENT:     Head: Atraumatic.  Eyes:     Extraocular Movements: Extraocular movements intact.     Conjunctiva/sclera: Conjunctivae normal.  Neck:     Musculoskeletal: Normal range of motion and neck supple.  Cardiovascular:     Rate and  Rhythm: Normal rate and regular rhythm.     Heart sounds: Normal heart sounds.  Pulmonary:     Effort: Pulmonary effort is normal.     Breath sounds: Normal breath sounds.  Abdominal:     General: Bowel sounds are normal.     Palpations: Abdomen is soft. There is no mass.     Tenderness: There is no abdominal tenderness. There is no right CVA tenderness, left CVA tenderness or guarding.  Genitourinary:    Comments: Numerous inflamed external hemorrhoids, internal inflammation diffusely per digital rectal exam. No active bleeding Musculoskeletal: Normal range of motion.  Skin:    General: Skin is warm and dry.  Neurological:     Mental Status: She is alert and oriented to person, place, and time.  Psychiatric:        Mood and Affect: Mood normal.        Thought Content: Thought content normal.        Judgment: Judgment normal.     Results for orders placed or performed in visit on 03/27/19  CBC With Differential/Platelet  Result Value Ref Range   WBC 7.3 3.4 - 10.8 x10E3/uL   RBC 3.85 3.77 - 5.28 x10E6/uL   Hemoglobin 13.2 11.1 - 15.9 g/dL  Hematocrit 38.8 34.0 - 46.6 %   MCV 101 (H) 79 - 97 fL   MCH 34.3 (H) 26.6 - 33.0 pg   MCHC 34.0 31.5 - 35.7 g/dL   RDW 14.7 11.7 - 15.4 %   Platelets 160 150 - 450 x10E3/uL   Neutrophils 42 Not Estab. %   Lymphs 46 Not Estab. %   MID 12 Not Estab. %   Neutrophils Absolute 3.1 1.4 - 7.0 x10E3/uL   Lymphocytes Absolute 3.4 (H) 0.7 - 3.1 x10E3/uL   MID (Absolute) 0.8 0.1 - 1.6 X10E3/uL      Assessment & Plan:   Problem List Items Addressed This Visit      Other   Long term (current) use of anticoagulants    Bleeding episodes are mild and intermittent, suspect related to hemorrhoids so will continue eliquis for now for risk reduction. Continue to monitor       Other Visit Diagnoses    Rectal bleeding    -  Primary   CBC WNL, well appearing in no distress. Suspect from hemorrhoids. Declines further testing at this time    Relevant Orders   CBC With Differential/Platelet (Completed)   Hemorrhoids, unspecified hemorrhoid type       Start anusol suppositories given difficulty inserting cream, use regularly for a week and call if bleeding persists. Refer to GI if so       Follow up plan: Return for as scheduled.

## 2019-03-31 NOTE — Assessment & Plan Note (Signed)
Bleeding episodes are mild and intermittent, suspect related to hemorrhoids so will continue eliquis for now for risk reduction. Continue to monitor

## 2019-04-01 DIAGNOSIS — I4891 Unspecified atrial fibrillation: Secondary | ICD-10-CM | POA: Diagnosis not present

## 2019-04-01 DIAGNOSIS — I10 Essential (primary) hypertension: Secondary | ICD-10-CM | POA: Diagnosis not present

## 2019-04-01 DIAGNOSIS — I739 Peripheral vascular disease, unspecified: Secondary | ICD-10-CM | POA: Diagnosis not present

## 2019-04-01 DIAGNOSIS — R0602 Shortness of breath: Secondary | ICD-10-CM | POA: Diagnosis not present

## 2019-04-01 DIAGNOSIS — E782 Mixed hyperlipidemia: Secondary | ICD-10-CM | POA: Diagnosis not present

## 2019-04-01 DIAGNOSIS — R600 Localized edema: Secondary | ICD-10-CM | POA: Diagnosis not present

## 2019-04-01 DIAGNOSIS — M48061 Spinal stenosis, lumbar region without neurogenic claudication: Secondary | ICD-10-CM | POA: Diagnosis not present

## 2019-04-01 DIAGNOSIS — R011 Cardiac murmur, unspecified: Secondary | ICD-10-CM | POA: Diagnosis not present

## 2019-04-03 ENCOUNTER — Telehealth: Payer: Self-pay | Admitting: Family Medicine

## 2019-04-03 NOTE — Telephone Encounter (Signed)
Returned patient's call. Last bleeding episode was Sunday and it was mild. No issues since then. Tolerating suppositories well but states they were expensive and they're hard to open. Discussed using the cream prn now that things seem to have calmed down and saving the suppositories for flares. Call if bleeding episodes becoming more frequent again

## 2019-04-03 NOTE — Telephone Encounter (Signed)
Pt called in to speak with Apolonio Schneiders. She mentioned that Apolonio Schneiders wanted her to call her and let her know how she was doing. She said she is better but would like Apolonio Schneiders to call her.

## 2019-04-15 DIAGNOSIS — H353132 Nonexudative age-related macular degeneration, bilateral, intermediate dry stage: Secondary | ICD-10-CM | POA: Diagnosis not present

## 2019-04-28 DIAGNOSIS — H6123 Impacted cerumen, bilateral: Secondary | ICD-10-CM | POA: Diagnosis not present

## 2019-04-28 DIAGNOSIS — H9193 Unspecified hearing loss, bilateral: Secondary | ICD-10-CM | POA: Diagnosis not present

## 2019-06-11 ENCOUNTER — Other Ambulatory Visit: Payer: Self-pay

## 2019-06-11 ENCOUNTER — Ambulatory Visit (INDEPENDENT_AMBULATORY_CARE_PROVIDER_SITE_OTHER): Payer: PPO

## 2019-06-11 DIAGNOSIS — Z23 Encounter for immunization: Secondary | ICD-10-CM | POA: Diagnosis not present

## 2019-07-29 ENCOUNTER — Other Ambulatory Visit: Payer: Self-pay | Admitting: Family Medicine

## 2019-08-07 ENCOUNTER — Other Ambulatory Visit: Payer: Self-pay | Admitting: Family Medicine

## 2019-08-07 NOTE — Telephone Encounter (Signed)
Requested Prescriptions  Pending Prescriptions Disp Refills  . furosemide (LASIX) 20 MG tablet [Pharmacy Med Name: FUROSEMIDE 20 MG TAB] 90 tablet 1    Sig: TAKE 1 TABLET BY MOUTH DAILY     Cardiovascular:  Diuretics - Loop Failed - 08/07/2019  7:46 AM      Failed - Last BP in normal range    BP Readings from Last 1 Encounters:  03/27/19 (!) 151/76         Passed - K in normal range and within 360 days    Potassium  Date Value Ref Range Status  02/06/2019 4.2 3.5 - 5.2 mmol/L Final         Passed - Ca in normal range and within 360 days    Calcium  Date Value Ref Range Status  02/06/2019 9.9 8.7 - 10.3 mg/dL Final         Passed - Na in normal range and within 360 days    Sodium  Date Value Ref Range Status  02/06/2019 141 134 - 144 mmol/L Final         Passed - Cr in normal range and within 360 days    Creatinine, Ser  Date Value Ref Range Status  02/06/2019 0.91 0.57 - 1.00 mg/dL Final         Passed - Valid encounter within last 6 months    Recent Outpatient Visits          4 months ago Rectal bleeding   [facility], [place]   6 months ago Essential hypertension   [facility], [place], [place]   12 months ago Cough   [facility], [place], [place]   1 year ago Acute lower UTI   [facility], [place], [place]   1 year ago Acute lower UTI   [facility], [place], [place]      Future Appointments            In 4 days O[facility] Desanctis, [place], [place], [place]

## 2019-08-11 ENCOUNTER — Ambulatory Visit (INDEPENDENT_AMBULATORY_CARE_PROVIDER_SITE_OTHER): Payer: PPO | Admitting: Family Medicine

## 2019-08-11 ENCOUNTER — Encounter: Payer: Self-pay | Admitting: Family Medicine

## 2019-08-11 ENCOUNTER — Other Ambulatory Visit: Payer: Self-pay

## 2019-08-11 ENCOUNTER — Telehealth: Payer: Self-pay | Admitting: Family Medicine

## 2019-08-11 VITALS — BP 130/80 | HR 80 | Temp 97.7°F | Ht 67.0 in | Wt 201.0 lb

## 2019-08-11 DIAGNOSIS — I4891 Unspecified atrial fibrillation: Secondary | ICD-10-CM

## 2019-08-11 DIAGNOSIS — I10 Essential (primary) hypertension: Secondary | ICD-10-CM

## 2019-08-11 DIAGNOSIS — E785 Hyperlipidemia, unspecified: Secondary | ICD-10-CM | POA: Diagnosis not present

## 2019-08-11 DIAGNOSIS — F325 Major depressive disorder, single episode, in full remission: Secondary | ICD-10-CM | POA: Diagnosis not present

## 2019-08-11 DIAGNOSIS — M810 Age-related osteoporosis without current pathological fracture: Secondary | ICD-10-CM | POA: Diagnosis not present

## 2019-08-11 DIAGNOSIS — N393 Stress incontinence (female) (male): Secondary | ICD-10-CM

## 2019-08-11 DIAGNOSIS — E039 Hypothyroidism, unspecified: Secondary | ICD-10-CM

## 2019-08-11 MED ORDER — SULFAMETHOXAZOLE-TRIMETHOPRIM 800-160 MG PO TABS: 1.0000 | ORAL_TABLET | Freq: Two times a day (BID) | ORAL | 0 refills | Status: DC

## 2019-08-11 NOTE — Telephone Encounter (Signed)
Please let her know that it does appear she's got a UTI going on from her urine this morning which is likely why she's having the symptoms she's experiencing. I sent over antibiotics to treat that and we will see what her urine culture shows.

## 2019-08-11 NOTE — Telephone Encounter (Signed)
Patient notified and verbalized understanding. 

## 2019-08-11 NOTE — Progress Notes (Signed)
BP 130/80   Pulse 80   Temp 97.7 F (36.5 C) (Oral)   Ht 5\' 7"  (1.702 m)   Wt 201 lb (91.2 kg)   LMP  (LMP Unknown)   SpO2 97%   BMI 31.48 kg/m    Subjective:    Patient ID: Mckenzie Gutierrez, female    DOB: 1922/07/07, 85 y.o.   MRN: [id number]  HPI: Mckenzie Gutierrez is a 85 y.o. female  Chief Complaint  Patient presents with  . Hypertension  . Hyperlipidemia  . Hypothyroidism   Patient presenting today for 6 month f/u.   Having some pressure down in pelvic area, and having urge incontinence. This has only been going on the past few months. Mild dysuria. Denies fever, chills, hematuria, flank pain, N/V/D. Not trying anything OTC for sxs.   Home BPs 120s-130s/70s when she checks typically, very occasionally 140s. Taking her medicines faithfully without side effects. Denies CP, SOB, HAs, dizziness.   Atrial fibrillation - rate well controlled, denies palpitations, SOB, syncope, bleeding or bruising issues on eliquis.   Hypothyroid - taking synthroid daily without issue. Asymptomatic.   HLD - Diet controlled. Eating healthy diet, tries to stay as active as she can.   Depression - stable on celexa, tolerating well and feeling like her moods stay in a good place with that. Denies SI/HI.   Depression screen [facility] 2/9 08/11/2019 07/31/2018 01/22/2018  Decreased Interest 0 0 0  Down, Depressed, Hopeless 0 0 0  PHQ - 2 Score 0 0 0  Altered sleeping 1 0 0  Tired, decreased energy 1 0 3  Change in appetite 0 0 0  Feeling bad or failure about yourself  0 0 0  Trouble concentrating 0 0 0  Moving slowly or fidgety/restless 0 0 0  Suicidal thoughts 0 0 0  PHQ-9 Score 2 0 3  Difficult doing work/chores - Not difficult at all -   GAD 7 : Generalized Anxiety Score 08/11/2019  Nervous, Anxious, on Edge 0  Control/stop worrying 0  Worry too much - different things 0  Trouble relaxing 0  Restless 0  Easily annoyed or irritable 0  Afraid - awful might happen 0  Total GAD 7 Score  0  Anxiety Difficulty Not difficult at all     Relevant past medical, surgical, family and social history reviewed and updated as indicated. Interim medical history since our last visit reviewed. Allergies and medications reviewed and updated.  Review of Systems  Per HPI unless specifically indicated above     Objective:    BP 130/80   Pulse 80   Temp 97.7 F (36.5 C) (Oral)   Ht 5\' 7"  (1.702 m)   Wt 201 lb (91.2 kg)   LMP  (LMP Unknown)   SpO2 97%   BMI 31.48 kg/m   Wt Readings from Last 3 Encounters:  08/11/19 201 lb (91.2 kg)  03/27/19 200 lb (90.7 kg)  03/11/19 200 lb (90.7 kg)    Physical Exam Vitals and nursing note reviewed.  Constitutional:      Appearance: Normal appearance. She is not ill-appearing.  HENT:     Head: Atraumatic.  Eyes:     Extraocular Movements: Extraocular movements intact.     Conjunctiva/sclera: Conjunctivae normal.  Cardiovascular:     Rate and Rhythm: Normal rate and regular rhythm.     Heart sounds: Normal heart sounds.  Pulmonary:     Effort: Pulmonary effort is normal.     Breath sounds:  Normal breath sounds.  Abdominal:     General: Bowel sounds are normal. There is no distension.     Palpations: Abdomen is soft.     Tenderness: There is abdominal tenderness (mild suprapubic ttp). There is no right CVA tenderness, left CVA tenderness or guarding.  Musculoskeletal:        General: Normal range of motion.     Cervical back: Normal range of motion and neck supple.  Skin:    General: Skin is warm and dry.  Neurological:     Mental Status: She is alert and oriented to person, place, and time.  Psychiatric:        Mood and Affect: Mood normal.        Thought Content: Thought content normal.        Judgment: Judgment normal.     Results for orders placed or performed in visit on 08/11/19  Microscopic Examination   URINE  Result Value Ref Range   WBC, UA >30 (A) 0 - 5 /hpf   RBC 3-10 (A) 0 - 2 /hpf   Epithelial Cells (non  renal) 0-10 0 - 10 /hpf   Bacteria, UA Many (A) None seen/Few  Urine Culture, Reflex   URINE  Result Value Ref Range   Urine Culture, Routine Final report (A)    Organism ID, Bacteria Escherichia coli (A)    Antimicrobial Susceptibility Comment   UA/M w/rflx Culture, Routine   Specimen: Urine   URINE  Result Value Ref Range   Specific Gravity, UA 1.015 1.005 - 1.030   pH, UA 7.0 5.0 - 7.5   Color, UA Yellow Yellow   Appearance Ur Cloudy (A) Clear   Leukocytes,UA 3+ (A) Negative   Protein,UA Negative Negative/Trace   Glucose, UA Negative Negative   Ketones, UA Negative Negative   RBC, UA 1+ (A) Negative   Bilirubin, UA Negative Negative   Urobilinogen, Ur 0.2 0.2 - 1.0 mg/dL   Nitrite, UA Positive (A) Negative   Microscopic Examination See below:    Urinalysis Reflex Comment   CBC with Differential/Platelet out  Result Value Ref Range   WBC 8.8 3.4 - 10.8 x10E3/uL   RBC 4.11 3.77 - 5.28 x10E6/uL   Hemoglobin 13.5 11.1 - 15.9 g/dL   Hematocrit 40.5 34.0 - 46.6 %   MCV 99 (H) 79 - 97 fL   MCH 32.8 26.6 - 33.0 pg   MCHC 33.3 31.5 - 35.7 g/dL   RDW 14.5 11.7 - 15.4 %   Platelets 163 150 - 450 x10E3/uL   Neutrophils 57 Not Estab. %   Lymphs 34 Not Estab. %   Monocytes 7 Not Estab. %   Eos 1 Not Estab. %   Basos 1 Not Estab. %   Neutrophils Absolute 4.9 1.4 - 7.0 x10E3/uL   Lymphocytes Absolute 3.0 0.7 - 3.1 x10E3/uL   Monocytes Absolute 0.6 0.1 - 0.9 x10E3/uL   EOS (ABSOLUTE) 0.1 0.0 - 0.4 x10E3/uL   Basophils Absolute 0.1 0.0 - 0.2 x10E3/uL   Immature Granulocytes 0 Not Estab. %   Immature Grans (Abs) 0.0 0.0 - 0.1 x10E3/uL   NRBC 1 (H) 0 - 0 %  Comprehensive metabolic panel  Result Value Ref Range   Glucose 100 (H) 65 - 99 mg/dL   BUN 12 10 - 36 mg/dL   Creatinine, Ser 0.94 0.57 - 1.00 mg/dL   GFR calc non Af Amer 52 (L) >59 mL/min/1.73   GFR calc Af Amer 60 >59 mL/min/1.73  BUN/Creatinine Ratio 13 12 - 28   Sodium 142 134 - 144 mmol/L   Potassium 4.0 3.5 - 5.2  mmol/L   Chloride 102 96 - 106 mmol/L   CO2 25 20 - 29 mmol/L   Calcium 9.6 8.7 - 10.3 mg/dL   Total Protein 7.2 6.0 - 8.5 g/dL   Albumin 4.5 3.5 - 4.6 g/dL   Globulin, Total 2.7 1.5 - 4.5 g/dL   Albumin/Globulin Ratio 1.7 1.2 - 2.2   Bilirubin Total 0.9 0.0 - 1.2 mg/dL   Alkaline Phosphatase 71 39 - 117 IU/L   AST 22 0 - 40 IU/L   ALT 10 0 - 32 IU/L  Lipid Panel w/o Chol/HDL Ratio out  Result Value Ref Range   Cholesterol, Total 176 100 - 199 mg/dL   Triglycerides 112 0 - 149 mg/dL   HDL 53 >39 mg/dL   VLDL Cholesterol Cal 20 5 - 40 mg/dL   LDL Chol Calc (NIH) 103 (H) 0 - 99 mg/dL  TSH  Result Value Ref Range   TSH 2.860 0.450 - 4.500 uIU/mL      Assessment & Plan:   Problem List Items Addressed This Visit      Cardiovascular and Mediastinum   Hypertension    Stable and under good control. Continue current regimen      Relevant Orders   CBC with Differential/Platelet out (Completed)   Comprehensive metabolic panel (Completed)   Atrial fibrillation (HCC)    Stable, rate well controlled and tolerating eliquis well. Continue current regimen        Endocrine   Hypothyroid    Recheck TSH, adjust as needed. Continue synthroid      Relevant Orders   TSH (Completed)     Musculoskeletal and Integument   Osteoporosis    On calcium and vitamin D supplementation and walks for exercise as tolerated. Continue current regimen        Other   HLD (hyperlipidemia)    Diet controlled. Recheck lipids, adjust as needed. Continue lifestyle modifications      Relevant Orders   Lipid Panel w/o Chol/HDL Ratio out (Completed)   Major depression in complete remission (HCC)    Stable and under good control, continue current regimen       Other Visit Diagnoses    Stress incontinence    -  Primary   Will recheck urine and treat if UTI. Discussed pelvic floor exercises for strengthening.    Relevant Orders   UA/M w/rflx Culture, Routine (Completed)       Follow up plan:  Return in about 6 months (around 02/09/2020) for CPE, AWV.

## 2019-08-12 LAB — CBC WITH DIFFERENTIAL/PLATELET
Basophils Absolute: 0.1 10*3/uL (ref 0.0–0.2)
Basos: 1 %
EOS (ABSOLUTE): 0.1 10*3/uL (ref 0.0–0.4)
Eos: 1 %
Hematocrit: 40.5 % (ref 34.0–46.6)
Hemoglobin: 13.5 g/dL (ref 11.1–15.9)
Immature Grans (Abs): 0 10*3/uL (ref 0.0–0.1)
Immature Granulocytes: 0 %
Lymphocytes Absolute: 3 10*3/uL (ref 0.7–3.1)
Lymphs: 34 %
MCH: 32.8 pg (ref 26.6–33.0)
MCHC: 33.3 g/dL (ref 31.5–35.7)
MCV: 99 fL — ABNORMAL HIGH (ref 79–97)
Monocytes Absolute: 0.6 10*3/uL (ref 0.1–0.9)
Monocytes: 7 %
NRBC: 1 % — ABNORMAL HIGH (ref 0–0)
Neutrophils Absolute: 4.9 10*3/uL (ref 1.4–7.0)
Neutrophils: 57 %
Platelets: 163 10*3/uL (ref 150–450)
RBC: 4.11 x10E6/uL (ref 3.77–5.28)
RDW: 14.5 % (ref 11.7–15.4)
WBC: 8.8 10*3/uL (ref 3.4–10.8)

## 2019-08-12 LAB — COMPREHENSIVE METABOLIC PANEL
ALT: 10 IU/L (ref 0–32)
AST: 22 IU/L (ref 0–40)
Albumin/Globulin Ratio: 1.7 (ref 1.2–2.2)
Albumin: 4.5 g/dL (ref 3.5–4.6)
Alkaline Phosphatase: 71 IU/L (ref 39–117)
BUN/Creatinine Ratio: 13 (ref 12–28)
BUN: 12 mg/dL (ref 10–36)
Bilirubin Total: 0.9 mg/dL (ref 0.0–1.2)
CO2: 25 mmol/L (ref 20–29)
Calcium: 9.6 mg/dL (ref 8.7–10.3)
Chloride: 102 mmol/L (ref 96–106)
Creatinine, Ser: 0.94 mg/dL (ref 0.57–1.00)
GFR calc Af Amer: 60 mL/min/{1.73_m2} (ref >59)
GFR calc non Af Amer: 52 mL/min/{1.73_m2} — ABNORMAL LOW (ref >59)
Globulin, Total: 2.7 g/dL (ref 1.5–4.5)
Glucose: 100 mg/dL — ABNORMAL HIGH (ref 65–99)
Potassium: 4 mmol/L (ref 3.5–5.2)
Sodium: 142 mmol/L (ref 134–144)
Total Protein: 7.2 g/dL (ref 6.0–8.5)

## 2019-08-12 LAB — LIPID PANEL W/O CHOL/HDL RATIO
Cholesterol, Total: 176 mg/dL (ref 100–199)
HDL: 53 mg/dL (ref >39)
LDL Chol Calc (NIH): 103 mg/dL — ABNORMAL HIGH (ref 0–99)
Triglycerides: 112 mg/dL (ref 0–149)
VLDL Cholesterol Cal: 20 mg/dL (ref 5–40)

## 2019-08-12 LAB — TSH: TSH: 2.86 u[IU]/mL (ref 0.450–4.500)

## 2019-08-15 LAB — URINE CULTURE, REFLEX

## 2019-08-15 LAB — UA/M W/RFLX CULTURE, ROUTINE
Bilirubin, UA: NEGATIVE
Glucose, UA: NEGATIVE
Ketones, UA: NEGATIVE
Nitrite, UA: POSITIVE — AB
Protein,UA: NEGATIVE
Specific Gravity, UA: 1.015 (ref 1.005–1.030)
Urobilinogen, Ur: 0.2 mg/dL (ref 0.2–1.0)
pH, UA: 7 (ref 5.0–7.5)

## 2019-08-15 LAB — MICROSCOPIC EXAMINATION: WBC, UA: >30 /hpf — AB (ref 0–5)

## 2019-08-16 NOTE — Assessment & Plan Note (Signed)
Stable, rate well controlled and tolerating eliquis well. Continue current regimen

## 2019-08-16 NOTE — Assessment & Plan Note (Signed)
Stable and under good control. Continue current regimen

## 2019-08-16 NOTE — Assessment & Plan Note (Signed)
Recheck TSH, adjust as needed. Continue synthroid

## 2019-08-16 NOTE — Assessment & Plan Note (Signed)
On calcium and vitamin D supplementation and walks for exercise as tolerated. Continue current regimen

## 2019-08-16 NOTE — Assessment & Plan Note (Signed)
Stable and under good control, continue current regimen 

## 2019-08-16 NOTE — Assessment & Plan Note (Signed)
Diet controlled. Recheck lipids, adjust as needed. Continue lifestyle modifications

## 2019-10-01 ENCOUNTER — Telehealth: Payer: Self-pay | Admitting: Family Medicine

## 2019-10-01 DIAGNOSIS — I6523 Occlusion and stenosis of bilateral carotid arteries: Secondary | ICD-10-CM | POA: Diagnosis not present

## 2019-10-01 DIAGNOSIS — I739 Peripheral vascular disease, unspecified: Secondary | ICD-10-CM | POA: Diagnosis not present

## 2019-10-01 DIAGNOSIS — R0600 Dyspnea, unspecified: Secondary | ICD-10-CM | POA: Diagnosis not present

## 2019-10-01 DIAGNOSIS — I10 Essential (primary) hypertension: Secondary | ICD-10-CM | POA: Diagnosis not present

## 2019-10-01 DIAGNOSIS — R600 Localized edema: Secondary | ICD-10-CM | POA: Diagnosis not present

## 2019-10-01 DIAGNOSIS — I480 Paroxysmal atrial fibrillation: Secondary | ICD-10-CM | POA: Diagnosis not present

## 2019-10-01 DIAGNOSIS — Z8673 Personal history of transient ischemic attack (TIA), and cerebral infarction without residual deficits: Secondary | ICD-10-CM | POA: Diagnosis not present

## 2019-10-01 DIAGNOSIS — E782 Mixed hyperlipidemia: Secondary | ICD-10-CM | POA: Diagnosis not present

## 2019-10-01 NOTE — Chronic Care Management (AMB) (Signed)
  Chronic Care Management   Note  10/01/2019 Name: Mckenzie Gutierrez MRN: [id number] DOB: [birth year]  Mckenzie Gutierrez is a 86 y.o. year old female who is a primary care patient of [facility], [place]. I reached out to [name] by phone today in response to a referral sent by Ms. Gurney Maxin Gidney's health plan.     Ms. Staheli was given information about Chronic Care Management services today including:  1. CCM service includes personalized support from designated clinical staff supervised by her physician, including individualized plan of care and coordination with other care providers 2. 24/7 contact phone numbers for assistance for urgent and routine care needs. 3. Service will only be billed when office clinical staff spend 20 minutes or more in a month to coordinate care. 4. Only one practitioner may furnish and bill the service in a calendar month. 5. The patient may stop CCM services at any time (effective at the end of the month) by phone call to the office staff. 6. The patient will be responsible for cost sharing (co-pay) of up to 20% of the service fee (after annual deductible is met).  Patient did not agree to enrollment in care management services and does not wish to consider at this time.  Follow up plan: The patient has been provided with contact information for the care management team and has been advised to call with any health related questions or concerns.   Noreene Larsson, [place], [place], [place] [address] Direct Dial: [phone] [email or web address]: [email or web address]

## 2019-10-07 DIAGNOSIS — R600 Localized edema: Secondary | ICD-10-CM | POA: Insufficient documentation

## 2019-10-07 DIAGNOSIS — Z8673 Personal history of transient ischemic attack (TIA), and cerebral infarction without residual deficits: Secondary | ICD-10-CM | POA: Insufficient documentation

## 2019-10-16 DIAGNOSIS — H353132 Nonexudative age-related macular degeneration, bilateral, intermediate dry stage: Secondary | ICD-10-CM | POA: Diagnosis not present

## 2019-10-16 DIAGNOSIS — R0600 Dyspnea, unspecified: Secondary | ICD-10-CM | POA: Diagnosis not present

## 2019-10-30 DIAGNOSIS — I10 Essential (primary) hypertension: Secondary | ICD-10-CM | POA: Diagnosis not present

## 2019-10-30 DIAGNOSIS — I272 Pulmonary hypertension, unspecified: Secondary | ICD-10-CM | POA: Diagnosis not present

## 2019-10-30 DIAGNOSIS — E782 Mixed hyperlipidemia: Secondary | ICD-10-CM | POA: Diagnosis not present

## 2019-10-30 DIAGNOSIS — R0600 Dyspnea, unspecified: Secondary | ICD-10-CM | POA: Diagnosis not present

## 2019-10-30 DIAGNOSIS — I480 Paroxysmal atrial fibrillation: Secondary | ICD-10-CM | POA: Diagnosis not present

## 2019-10-30 DIAGNOSIS — Z8673 Personal history of transient ischemic attack (TIA), and cerebral infarction without residual deficits: Secondary | ICD-10-CM | POA: Diagnosis not present

## 2019-10-30 DIAGNOSIS — I6523 Occlusion and stenosis of bilateral carotid arteries: Secondary | ICD-10-CM | POA: Diagnosis not present

## 2019-10-30 DIAGNOSIS — R2689 Other abnormalities of gait and mobility: Secondary | ICD-10-CM | POA: Diagnosis not present

## 2019-10-30 DIAGNOSIS — R600 Localized edema: Secondary | ICD-10-CM | POA: Diagnosis not present

## 2019-10-30 DIAGNOSIS — I739 Peripheral vascular disease, unspecified: Secondary | ICD-10-CM | POA: Diagnosis not present

## 2019-10-30 DIAGNOSIS — R531 Weakness: Secondary | ICD-10-CM | POA: Diagnosis not present

## 2019-11-10 ENCOUNTER — Other Ambulatory Visit: Payer: Self-pay | Admitting: Family Medicine

## 2020-02-06 ENCOUNTER — Other Ambulatory Visit: Payer: Self-pay | Admitting: Family Medicine

## 2020-02-11 ENCOUNTER — Encounter: Payer: Self-pay | Admitting: Family Medicine

## 2020-02-11 ENCOUNTER — Other Ambulatory Visit: Payer: Self-pay

## 2020-02-11 ENCOUNTER — Ambulatory Visit (INDEPENDENT_AMBULATORY_CARE_PROVIDER_SITE_OTHER): Payer: PPO | Admitting: Family Medicine

## 2020-02-11 ENCOUNTER — Ambulatory Visit (INDEPENDENT_AMBULATORY_CARE_PROVIDER_SITE_OTHER): Payer: PPO

## 2020-02-11 VITALS — BP 136/76 | HR 94 | Temp 98.1°F | Ht 65.6 in | Wt 197.0 lb

## 2020-02-11 VITALS — BP 136/76 | HR 94 | Temp 98.1°F | Resp 15 | Ht 65.6 in | Wt 197.0 lb

## 2020-02-11 DIAGNOSIS — F325 Major depressive disorder, single episode, in full remission: Secondary | ICD-10-CM | POA: Diagnosis not present

## 2020-02-11 DIAGNOSIS — K625 Hemorrhage of anus and rectum: Secondary | ICD-10-CM | POA: Diagnosis not present

## 2020-02-11 DIAGNOSIS — R350 Frequency of micturition: Secondary | ICD-10-CM

## 2020-02-11 DIAGNOSIS — M810 Age-related osteoporosis without current pathological fracture: Secondary | ICD-10-CM

## 2020-02-11 DIAGNOSIS — E039 Hypothyroidism, unspecified: Secondary | ICD-10-CM | POA: Diagnosis not present

## 2020-02-11 DIAGNOSIS — I10 Essential (primary) hypertension: Secondary | ICD-10-CM | POA: Diagnosis not present

## 2020-02-11 DIAGNOSIS — M5432 Sciatica, left side: Secondary | ICD-10-CM

## 2020-02-11 DIAGNOSIS — I6523 Occlusion and stenosis of bilateral carotid arteries: Secondary | ICD-10-CM

## 2020-02-11 DIAGNOSIS — E785 Hyperlipidemia, unspecified: Secondary | ICD-10-CM

## 2020-02-11 DIAGNOSIS — K219 Gastro-esophageal reflux disease without esophagitis: Secondary | ICD-10-CM | POA: Diagnosis not present

## 2020-02-11 DIAGNOSIS — Z0000 Encounter for general adult medical examination without abnormal findings: Secondary | ICD-10-CM

## 2020-02-11 DIAGNOSIS — I4891 Unspecified atrial fibrillation: Secondary | ICD-10-CM

## 2020-02-11 MED ORDER — HYDROCORTISONE ACETATE 25 MG RE SUPP
25.0000 mg | Freq: Two times a day (BID) | RECTAL | 2 refills | Status: DC | PRN
Start: 2020-02-11 — End: 2020-04-05

## 2020-02-11 NOTE — Assessment & Plan Note (Signed)
Recheck lipids, continue aggressive medication mgmt

## 2020-02-11 NOTE — Progress Notes (Signed)
BP 136/76   Pulse 94   Temp 98.1 F (36.7 C) (Oral)   Ht 5' 5.6" (1.666 m)   Wt 197 lb (89.4 kg)   LMP  (LMP Unknown)   SpO2 96%   BMI 32.19 kg/m    Subjective:    Patient ID: Mckenzie Gutierrez, female    DOB: Jan 11, 1922, 86 y.o.   MRN: [id number]  HPI: Mckenzie Gutierrez is a 86 y.o. female presenting on 02/11/2020 for comprehensive medical examination. Current medical complaints include:see below  Urinary frequency the past few weeks, with some urgency and leakage but also sometimes having trouble voiding fully. Concerned that her protonix has affected her kidneys after reading potential long term effects of the medication. Denies abdominal pain, fever, chills, sweats, diarrhea, N/V.   Intermittent issues with hemorrhoids and rectal bleeding. Was able to achieve remission for several months after using anusol suppositories up until yesterday after straining for a firm BM. Has had several episodes of rectal bleeding and pressure since but bleeding does cease after wiping a time or two. Not currently using anything topically or taking regular miralax. Of note, is on eliquis for atrial fibrillation.   Still having ongoing left low back pain with radiation down left leg. Tylenol seems to help some. Does a lot of sitting during the day, does not stretch. Denies numbness or tingling down toward feet, bowel or bladder incontinence, fevers.   HTN, atrial fibrillation, valvular disease and vascular atherosclerosis - followed by Cardiology, BPs and HRs stable on current regimen without CP, SOB, syncope, bleeding or bruising issues.   Synthroid well tolerated for hypothyroidism. No new concerns.   On celexa for depression which has been going well. No SI/HI.   She currently lives with: Menopausal Symptoms: no  Depression Screen done today and results listed below:  Depression screen [facility] 2/9 02/11/2020 08/11/2019 07/31/2018 01/22/2018 07/20/2017  Decreased Interest 1 0 0 0 0  Down, Depressed,  Hopeless 1 0 0 0 0  PHQ - 2 Score 2 0 0 0 0  Altered sleeping 0 1 0 0 0  Tired, decreased energy 3 1 0 3 1  Change in appetite 0 0 0 0 3  Feeling bad or failure about yourself  0 0 0 0 0  Trouble concentrating 0 0 0 0 0  Moving slowly or fidgety/restless 0 0 0 0 0  Suicidal thoughts 0 0 0 0 0  PHQ-9 Score 5 2 0 3 4  Difficult doing work/chores Not difficult at all - Not difficult at all - Not difficult at all    The patient does not have a history of falls. I did complete a risk assessment for falls. A plan of care for falls was documented.   Past Medical History:  Past Medical History:  Diagnosis Date  . Anxiety   . Atrial premature beats   . Bradycardia   . Carotid stenosis   . Depression   . GERD (gastroesophageal reflux disease)   . Hyperlipidemia   . Hypertension   . Low back pain   . Osteoporosis   . Thyroid disease   . Urinary frequency     Surgical History:  Past Surgical History:  Procedure Laterality Date  . CARPAL TUNNEL RELEASE    . WRIST SURGERY      Medications:  Current Outpatient Medications on File Prior to Visit  Medication Sig  . amLODipine (NORVASC) 5 MG tablet TAKE 1 TABLET BY MOUTH DAILY  . apixaban (ELIQUIS) 2.5  MG TABS tablet Take 2.5 mg by mouth 2 (two) times daily.  . beta carotene w/minerals (OCUVITE) tablet Take 1 tablet by mouth daily.  . Biotin 5000 MCG CAPS Take by mouth daily.  . citalopram (CELEXA) 20 MG tablet Take 1 tablet (20 mg total) by mouth daily.  . furosemide (LASIX) 20 MG tablet TAKE ONE TABLET EVERY DAY  . hydrocortisone (ANUSOL-HC) 2.5 % rectal cream APPLY RECTALLY TWICE DAILY AS DIRECTED  . levothyroxine (SYNTHROID) 75 MCG tablet TAKE 1 TABLET EVERY DAY ON EMPTY STOMACHWITH A GLASS OF WATER AT LEAST 30-60 MINBEFORE BREAKFAST  . losartan (COZAAR) 100 MG tablet TAKE ONE TABLET EVERY DAY  . Multiple Vitamin (MULTIVITAMIN) tablet Take 1 tablet by mouth daily.  . polyethylene glycol (MIRALAX / GLYCOLAX) packet MIX ONE  PACKAGE IN LIQUID AND TAKE DAILY   No current facility-administered medications on file prior to visit.    Allergies:  Allergies  Allergen Reactions  . Celebrex [Celecoxib] Other (See Comments)    Hard stools  . Codeine Sulfate Other (See Comments)  . Hydrochlorothiazide   . Hytrin [Terazosin]   . Plendil [Felodipine] Diarrhea and Nausea Only  . Nitrofuran Derivatives Swelling and Rash  . Nitrofurantoin Rash and Swelling    Social History:  Social History   Socioeconomic History  . Marital status: Widowed    Spouse name: Not on file  . Number of children: Not on file  . Years of education: Not on file  . Highest education level: Not on file  Occupational History  . Not on file  Tobacco Use  . Smoking status: Never Smoker  . Smokeless tobacco: Never Used  Substance and Sexual Activity  . Alcohol use: No    Alcohol/week: 0.0 standard drinks  . Drug use: No  . Sexual activity: Never  Other Topics Concern  . Not on file  Social History Narrative  . Not on file   Social Determinants of Health   Financial Resource Strain: Low Risk   . Difficulty of Paying Living Expenses: Not hard at all  Food Insecurity: No Food Insecurity  . Worried About Charity fundraiser in the Last Year: Never true  . Ran Out of Food in the Last Year: Never true  Transportation Needs: No Transportation Needs  . Lack of Transportation (Medical): No  . Lack of Transportation (Non-Medical): No  Physical Activity: Inactive  . Days of Exercise per Week: 0 days  . Minutes of Exercise per Session: 0 min  Stress:   . Feeling of Stress :   Social Connections: Somewhat Isolated  . Frequency of Communication with Friends and Family: More than three times a week  . Frequency of Social Gatherings with Friends and Family: More than three times a week  . Attends Religious Services: More than 4 times per year  . Active Member of Clubs or Organizations: No  . Attends Archivist Meetings: Never    . Marital Status: Widowed  Intimate Partner Violence:   . Fear of Current or Ex-Partner:   . Emotionally Abused:   Marland Kitchen Physically Abused:   . Sexually Abused:    Social History   Tobacco Use  Smoking Status Never Smoker  Smokeless Tobacco Never Used   Social History   Substance and Sexual Activity  Alcohol Use No  . Alcohol/week: 0.0 standard drinks    Family History:  Family History  Problem Relation Age of Onset  . Heart disease Mother   . Stroke Father   .  Diabetes Father   . Stroke Sister     Past medical history, surgical history, medications, allergies, family history and social history reviewed with patient today and changes made to appropriate areas of the chart.   Review of Systems - General ROS: negative Psychological ROS: negative Ophthalmic ROS: negative ENT ROS: negative Allergy and Immunology ROS: negative Hematological and Lymphatic ROS: negative Endocrine ROS: negative Breast ROS: negative for breast lumps Respiratory ROS: no cough, shortness of breath, or wheezing Cardiovascular ROS: no chest pain or dyspnea on exertion Gastrointestinal ROS: positive for - blood in stools Genito-Urinary ROS: positive for - urinary frequency/urgency Musculoskeletal ROS: positive for - back pain Neurological ROS: no TIA or stroke symptoms Dermatological ROS: negative All other ROS negative except what is listed above and in the HPI.      Objective:    BP 136/76   Pulse 94   Temp 98.1 F (36.7 C) (Oral)   Ht 5' 5.6" (1.666 m)   Wt 197 lb (89.4 kg)   LMP  (LMP Unknown)   SpO2 96%   BMI 32.19 kg/m   Wt Readings from Last 3 Encounters:  02/11/20 197 lb (89.4 kg)  02/11/20 197 lb (89.4 kg)  08/11/19 201 lb (91.2 kg)    Physical Exam Vitals and nursing note reviewed.  Constitutional:      General: She is not in acute distress.    Appearance: She is well-developed.  HENT:     Head: Atraumatic.     Right Ear: External ear normal.     Left Ear: External  ear normal.     Nose: Nose normal.     Mouth/Throat:     Pharynx: No oropharyngeal exudate.  Eyes:     General: No scleral icterus.    Conjunctiva/sclera: Conjunctivae normal.     Pupils: Pupils are equal, round, and reactive to light.  Neck:     Thyroid: No thyromegaly.  Cardiovascular:     Rate and Rhythm: Normal rate.     Pulses: Normal pulses.  Pulmonary:     Effort: Pulmonary effort is normal. No respiratory distress.     Breath sounds: Normal breath sounds.  Chest:     Comments: Declines breast exam Abdominal:     General: Bowel sounds are normal.     Palpations: Abdomen is soft. There is no mass.     Tenderness: There is no abdominal tenderness.  Genitourinary:    Comments: Declines GU exam Musculoskeletal:        General: No tenderness. Normal range of motion.     Cervical back: Normal range of motion and neck supple.  Lymphadenopathy:     Cervical: No cervical adenopathy.  Skin:    General: Skin is warm and dry.     Findings: No rash.  Neurological:     Mental Status: She is alert and oriented to person, place, and time.     Cranial Nerves: No cranial nerve deficit.  Psychiatric:        Behavior: Behavior normal.     Results for orders placed or performed in visit on 02/11/20  Microscopic Examination   URINE  Result Value Ref Range   WBC, UA 6-10 (A) 0 - 5 /hpf   RBC 3-10 (A) 0 - 2 /hpf   Epithelial Cells (non renal) 0-10 0 - 10 /hpf   Bacteria, UA Many (A) None seen/Few  Urine Culture, Reflex   URINE  Result Value Ref Range   Urine Culture, Routine WILL FOLLOW  UA/M w/rflx Culture, Routine   Specimen: Urine   URINE  Result Value Ref Range   Specific Gravity, UA 1.015 1.005 - 1.030   pH, UA 7.0 5.0 - 7.5   Color, UA Yellow Yellow   Appearance Ur Clear Clear   Leukocytes,UA 2+ (A) Negative   Protein,UA Negative Negative/Trace   Glucose, UA Negative Negative   Ketones, UA Negative Negative   RBC, UA 2+ (A) Negative   Bilirubin, UA Negative  Negative   Urobilinogen, Ur 0.2 0.2 - 1.0 mg/dL   Nitrite, UA Negative Negative   Microscopic Examination See below:    Urinalysis Reflex Comment       Assessment & Plan:   Problem List Items Addressed This Visit      Cardiovascular and Mediastinum   Carotid stenosis    Recheck lipids, continue aggressive medication mgmt      Hypertension - Primary    Stable and WNL, continue current regimen      Relevant Orders   CBC with Differential/Platelet   Comprehensive metabolic panel   UA/M w/rflx Culture, Routine (Completed)   Atrial fibrillation (HCC)    Stable and under good control, continue current regimen        Digestive   GERD (gastroesophageal reflux disease)    Pt opting to d/c protonix due to concern regarding some information she read about kidney damage with long term use.         Endocrine   Hypothyroid    Recheck TSH, adjust as needed. Continue current regimen      Relevant Orders   TSH     Musculoskeletal and Integument   Osteoporosis    Stable on OTC vitamin regimen, continue current regimen        Other   HLD (hyperlipidemia)    Recheck lipids, adjust as needed. Continue lifestyle modifications      Relevant Orders   Lipid Panel w/o Chol/HDL Ratio   Major depression in complete remission (HCC)    Stable and well controlled, continue current regimen       Other Visit Diagnoses    Annual physical exam       Urinary frequency       Recheck urine and basic labs, treat as needed. Discussed pelvic floor muscle exercises   Left sided sciatica       Continue tylenol, increase exercise as tolerated and discussed some safe stretches. Lidocaine patches recommended as well   Rectal bleeding       Has anusol cream, suppositories worked much better in past. Trial this for a week or so with daily miralax, if still bleeding refer to GI       Follow up plan: Return in about 6 months (around 08/12/2020) for 6 month f/u.   LABORATORY TESTING:  - Pap  smear: not applicable  IMMUNIZATIONS:   - Tdap: Tetanus vaccination status reviewed: postponed due to insurance coverage. - Influenza: Postponed to flu season - Pneumovax: Up to date - Prevnar: Up to date - HPV: Not applicable - Zostavax vaccine: Up to date  SCREENING: -Mammogram: Not applicable  - Colonoscopy: Not applicable  - Bone Density: Up to date   PATIENT COUNSELING:   Advised to take 1 mg of folate supplement per day if capable of pregnancy.   Sexuality: Discussed sexually transmitted diseases, partner selection, use of condoms, avoidance of unintended pregnancy  and contraceptive alternatives.   Advised to avoid cigarette smoking.  I discussed with the patient that most people either  abstain from alcohol or drink within safe limits (<=14/week and <=4 drinks/occasion for males, <=7/weeks and <= 3 drinks/occasion for females) and that the risk for alcohol disorders and other health effects rises proportionally with the number of drinks per week and how often a drinker exceeds daily limits.  Discussed cessation/primary prevention of drug use and availability of treatment for abuse.   Diet: Encouraged to adjust caloric intake to maintain  or achieve ideal body weight, to reduce intake of dietary saturated fat and total fat, to limit sodium intake by avoiding high sodium foods and not adding table salt, and to maintain adequate dietary potassium and calcium preferably from fresh fruits, vegetables, and low-fat dairy products.    stressed the importance of regular exercise  Injury prevention: Discussed safety belts, safety helmets, smoke detector, smoking near bedding or upholstery.   Dental health: Discussed importance of regular tooth brushing, flossing, and dental visits.    NEXT PREVENTATIVE PHYSICAL DUE IN 1 YEAR. Return in about 6 months (around 08/12/2020) for 6 month f/u.

## 2020-02-11 NOTE — Assessment & Plan Note (Signed)
Recheck TSH, adjust as needed. Continue current regimen 

## 2020-02-11 NOTE — Patient Instructions (Signed)
Mckenzie Gutierrez , Thank you for taking time to come for your Medicare Wellness Visit. I appreciate your ongoing commitment to your health goals. Please review the following plan we discussed and let me know if I can assist you in the future.   Screening recommendations/referrals: Colonoscopy: no longer required  Mammogram: no longer required  Bone Density: completed 2012  Recommended yearly ophthalmology/optometry visit for glaucoma screening and checkup Recommended yearly dental visit for hygiene and checkup  Vaccinations: Influenza vaccine: due 05/2020 Pneumococcal vaccine: completed series  Tdap vaccine: due, check with your insurance for coverage  Shingles vaccine: shingrix eligible, check with pharmacy for coverage    Covid-19:completed series   Advanced directives: copy on file   Conditions/risks identified: fall preventions discussed  Next appointment: Follow up in one year for your annual wellness visit   Preventive Care 84 Years and Older, Female Preventive care refers to lifestyle choices and visits with your health care provider that can promote health and wellness. What does preventive care include?  A yearly physical exam. This is also called an annual well check.  Dental exams once or twice a year.  Routine eye exams. Ask your health care provider how often you should have your eyes checked.  Personal lifestyle choices, including:  Daily care of your teeth and gums.  Regular physical activity.  Eating a healthy diet.  Avoiding tobacco and drug use.  Limiting alcohol use.  Practicing safe sex.  Taking low-dose aspirin every day.  Taking vitamin and mineral supplements as recommended by your health care provider. What happens during an annual well check? The services and screenings done by your health care provider during your annual well check will depend on your age, overall health, lifestyle risk factors, and family history of disease. Counseling  Your  health care provider may ask you questions about your:  Alcohol use.  Tobacco use.  Drug use.  Emotional well-being.  Home and relationship well-being.  Sexual activity.  Eating habits.  History of falls.  Memory and ability to understand (cognition).  Work and work Statistician.  Reproductive health. Screening  You may have the following tests or measurements:  Height, weight, and BMI.  Blood pressure.  Lipid and cholesterol levels. These may be checked every 5 years, or more frequently if you are over 23 years old.  Skin check.  Lung cancer screening. You may have this screening every year starting at age 30 if you have a 30-pack-year history of smoking and currently smoke or have quit within the past 15 years.  Fecal occult blood test (FOBT) of the stool. You may have this test every year starting at age 25.  Flexible sigmoidoscopy or colonoscopy. You may have a sigmoidoscopy every 5 years or a colonoscopy every 10 years starting at age 104.  Hepatitis C blood test.  Hepatitis B blood test.  Sexually transmitted disease (STD) testing.  Diabetes screening. This is done by checking your blood sugar (glucose) after you have not eaten for a while (fasting). You may have this done every 1-3 years.  Bone density scan. This is done to screen for osteoporosis. You may have this done starting at age 41.  Mammogram. This may be done every 1-2 years. Talk to your health care provider about how often you should have regular mammograms. Talk with your health care provider about your test results, treatment options, and if necessary, the need for more tests. Vaccines  Your health care provider may recommend certain vaccines, such as:  Influenza vaccine. This is recommended every year.  Tetanus, diphtheria, and acellular pertussis (Tdap, Td) vaccine. You may need a Td booster every 10 years.  Zoster vaccine. You may need this after age 55.  Pneumococcal 13-valent  conjugate (PCV13) vaccine. One dose is recommended after age 73.  Pneumococcal polysaccharide (PPSV23) vaccine. One dose is recommended after age 11. Talk to your health care provider about which screenings and vaccines you need and how often you need them. This information is not intended to replace advice given to you by your health care provider. Make sure you discuss any questions you have with your health care provider. Document Released: 09/17/2015 Document Revised: 05/10/2016 Document Reviewed: 06/22/2015 Elsevier Interactive Patient Education  2017 [place] Prevention in the Home Falls can cause injuries. They can happen to people of all ages. There are many things you can do to make your home safe and to help prevent falls. What can I do on the outside of my home?  Regularly fix the edges of walkways and driveways and fix any cracks.  Remove anything that might make you trip as you walk through a door, such as a raised step or threshold.  Trim any bushes or trees on the path to your home.  Use bright outdoor lighting.  Clear any walking paths of anything that might make someone trip, such as rocks or tools.  Regularly check to see if handrails are loose or broken. Make sure that both sides of any steps have handrails.  Any raised decks and porches should have guardrails on the edges.  Have any leaves, snow, or ice cleared regularly.  Use sand or salt on walking paths during winter.  Clean up any spills in your garage right away. This includes oil or grease spills. What can I do in the bathroom?  Use night lights.  Install grab bars by the toilet and in the tub and shower. Do not use towel bars as grab bars.  Use non-skid mats or decals in the tub or shower.  If you need to sit down in the shower, use a plastic, non-slip stool.  Keep the floor dry. Clean up any water that spills on the floor as soon as it happens.  Remove soap buildup in the tub or  shower regularly.  Attach bath mats securely with double-sided non-slip rug tape.  Do not have throw rugs and other things on the floor that can make you trip. What can I do in the bedroom?  Use night lights.  Make sure that you have a light by your bed that is easy to reach.  Do not use any sheets or blankets that are too big for your bed. They should not hang down onto the floor.  Have a firm chair that has side arms. You can use this for support while you get dressed.  Do not have throw rugs and other things on the floor that can make you trip. What can I do in the kitchen?  Clean up any spills right away.  Avoid walking on wet floors.  Keep items that you use a lot in easy-to-reach places.  If you need to reach something above you, use a strong step stool that has a grab bar.  Keep electrical cords out of the way.  Do not use floor polish or wax that makes floors slippery. If you must use wax, use non-skid floor wax.  Do not have throw rugs and other things on the floor that  can make you trip. What can I do with my stairs?  Do not leave any items on the stairs.  Make sure that there are handrails on both sides of the stairs and use them. Fix handrails that are broken or loose. Make sure that handrails are as long as the stairways.  Check any carpeting to make sure that it is firmly attached to the stairs. Fix any carpet that is loose or worn.  Avoid having throw rugs at the top or bottom of the stairs. If you do have throw rugs, attach them to the floor with carpet tape.  Make sure that you have a light switch at the top of the stairs and the bottom of the stairs. If you do not have them, ask someone to add them for you. What else can I do to help prevent falls?  Wear shoes that:  Do not have high heels.  Have rubber bottoms.  Are comfortable and fit you well.  Are closed at the toe. Do not wear sandals.  If you use a stepladder:  Make sure that it is fully  opened. Do not climb a closed stepladder.  Make sure that both sides of the stepladder are locked into place.  Ask someone to hold it for you, if possible.  Clearly mark and make sure that you can see:  Any grab bars or handrails.  First and last steps.  Where the edge of each step is.  Use tools that help you move around (mobility aids) if they are needed. These include:  Canes.  Walkers.  Scooters.  Crutches.  Turn on the lights when you go into a dark area. Replace any light bulbs as soon as they burn out.  Set up your furniture so you have a clear path. Avoid moving your furniture around.  If any of your floors are uneven, fix them.  If there are any pets around you, be aware of where they are.  Review your medicines with your doctor. Some medicines can make you feel dizzy. This can increase your chance of falling. Ask your doctor what other things that you can do to help prevent falls. This information is not intended to replace advice given to you by your health care provider. Make sure you discuss any questions you have with your health care provider. Document Released: 06/17/2009 Document Revised: 01/27/2016 Document Reviewed: 09/25/2014 Elsevier Interactive Patient Education  2017 [employer].

## 2020-02-11 NOTE — Progress Notes (Signed)
Subjective:   Mckenzie Gutierrez is a 86 y.o. female who presents for Medicare Annual (Subsequent) preventive examination.  Review of Systems:   Cardiac Risk Factors include: advanced age (>49men, >11 women);hypertension     Objective:     Vitals: BP 136/76   Pulse 94   Temp 98.1 F (36.7 C)   Resp 15   Ht 5' 5.6" (1.666 m)   Wt 197 lb (89.4 kg)   LMP  (LMP Unknown)   BMI 32.19 kg/m   Body mass index is 32.19 kg/m.  Advanced Directives 02/11/2020 02/20/2017 07/19/2016 03/01/2016 02/16/2016 07/14/2015 07/14/2015  Does Patient Have a Medical Advance Directive? Yes Yes Yes Yes Yes Yes No;Yes  Type of Advance Directive Living will;Healthcare Power of [place];Living will [place];Living will [place];Living will [place];Living will [place];Living will  Copy of [place] in Chart? Yes - validated most recent copy scanned in chart (See row information) - - No - copy requested No - copy requested No - copy requested -    Tobacco Social History   Tobacco Use  Smoking Status Never Smoker  Smokeless Tobacco Never Used     Counseling given: Not Answered   Clinical Intake:  Pre-visit preparation completed: Yes  Pain : No/denies pain     Nutritional Status: BMI > 30  Obese Nutritional Risks: None Diabetes: No  How often do you need to have someone help you when you read instructions, pamphlets, or other written materials from your doctor or pharmacy?: 1 - Never  Interpreter Needed?: No  Information entered by :: Myldred Raju,LPN  Past Medical History:  Diagnosis Date  . Anxiety   . Atrial premature beats   . Bradycardia   . Carotid stenosis   . Depression   . GERD (gastroesophageal reflux disease)   . Hyperlipidemia   . Hypertension   . Low back pain   . Osteoporosis   . Thyroid disease   . Urinary frequency    Past Surgical  History:  Procedure Laterality Date  . CARPAL TUNNEL RELEASE    . WRIST SURGERY     Family History  Problem Relation Age of Onset  . Heart disease Mother   . Stroke Father   . Diabetes Father   . Stroke Sister    Social History   Socioeconomic History  . Marital status: Widowed    Spouse name: Not on file  . Number of children: Not on file  . Years of education: Not on file  . Highest education level: Not on file  Occupational History  . Not on file  Tobacco Use  . Smoking status: Never Smoker  . Smokeless tobacco: Never Used  Substance and Sexual Activity  . Alcohol use: No    Alcohol/week: 0.0 standard drinks  . Drug use: No  . Sexual activity: Never  Other Topics Concern  . Not on file  Social History Narrative  . Not on file   Social Determinants of Health   Financial Resource Strain: Low Risk   . Difficulty of Paying Living Expenses: Not hard at all  Food Insecurity: No Food Insecurity  . Worried About Charity fundraiser in the Last Year: Never true  . Ran Out of Food in the Last Year: Never true  Transportation Needs: No Transportation Needs  . Lack of Transportation (Medical): No  . Lack of Transportation (Non-Medical): No  Physical Activity: Inactive  . Days of Exercise per Week: 0 days  . Minutes of Exercise per Session: 0 min  Stress:   . Feeling of Stress :   Social Connections: Somewhat Isolated  . Frequency of Communication with Friends and Family: More than three times a week  . Frequency of Social Gatherings with Friends and Family: More than three times a week  . Attends Religious Services: More than 4 times per year  . Active Member of Clubs or Organizations: No  . Attends Archivist Meetings: Never  . Marital Status: Widowed    Outpatient Encounter Medications as of 02/11/2020  Medication Sig  . amLODipine (NORVASC) 5 MG tablet TAKE 1 TABLET BY MOUTH DAILY  . apixaban (ELIQUIS) 2.5 MG TABS tablet Take 2.5 mg by mouth 2 (two)  times daily.  . beta carotene w/minerals (OCUVITE) tablet Take 1 tablet by mouth daily.  . Biotin 5000 MCG CAPS Take by mouth daily.  . citalopram (CELEXA) 20 MG tablet Take 1 tablet (20 mg total) by mouth daily.  . furosemide (LASIX) 20 MG tablet TAKE ONE TABLET EVERY DAY  . hydrocortisone (ANUSOL-HC) 2.5 % rectal cream APPLY RECTALLY TWICE DAILY AS DIRECTED  . hydrocortisone (ANUSOL-HC) 25 MG suppository Place 1 suppository (25 mg total) rectally 2 (two) times daily as needed for hemorrhoids or anal itching.  . levothyroxine (SYNTHROID) 75 MCG tablet TAKE 1 TABLET EVERY DAY ON EMPTY STOMACHWITH A GLASS OF WATER AT LEAST 30-60 MINBEFORE BREAKFAST  . losartan (COZAAR) 100 MG tablet TAKE ONE TABLET EVERY DAY  . Multiple Vitamin (MULTIVITAMIN) tablet Take 1 tablet by mouth daily.  . polyethylene glycol (MIRALAX / GLYCOLAX) packet MIX ONE PACKAGE IN LIQUID AND TAKE DAILY   No facility-administered encounter medications on file as of 02/11/2020.    Activities of Daily Living In your present state of health, do you have any difficulty performing the following activities: 02/11/2020 08/11/2019  Hearing? Y Y  Comment hearing aids -  Vision? N N  Comment Dr. Wallace Going -  Difficulty concentrating or making decisions? N N  Walking or climbing stairs? N N  Dressing or bathing? N N  Doing errands, shopping? N N  Preparing Food and eating ? N -  Using the Toilet? N -  In the past six months, have you accidently leaked urine? N -  Do you have problems with loss of bowel control? N -  Managing your Medications? N -  Managing your Finances? N -  Housekeeping or managing your Housekeeping? N -  Some recent data might be hidden    Patient Care Team: Volney American, PA-C as PCP - General (Family Medicine) Conception Chancy, DDS as Referring Physician (Dentistry) Leandrew Koyanagi, MD as Referring Physician (Ophthalmology)    Assessment:   This is a routine wellness examination for  Mckenzie Gutierrez.  Exercise Activities and Dietary recommendations Current Exercise Habits: The patient does not participate in regular exercise at present, Exercise limited by: None identified  Goals Addressed   None     Fall Risk: Fall Risk  02/11/2020 08/11/2019 07/31/2018 07/20/2017 02/06/2017  Falls in the past year? 0 1 1 No No  Number falls in past yr: 0 0 0 - -  Injury with Fall? 0 0 1 - -    FALL RISK PREVENTION PERTAINING TO THE HOME:  Any stairs in or around the home? No  If so, are there any without handrails? Yes   Home free of loose throw rugs  in walkways, pet beds, electrical cords, etc? Yes  Adequate lighting in your home to reduce risk of falls? Yes   ASSISTIVE DEVICES UTILIZED TO PREVENT FALLS:  Life alert? No  Use of a cane, walker or w/c? No  Grab bars in the bathroom? No  Shower chair or bench in shower? No  Elevated toilet seat or a handicapped toilet? No   DME ORDERS:  DME order needed?  No   TIMED UP AND GO:  Was the test performed? Yes .  Length of time to ambulate 10 feet: 9 sec.   GAIT:  Appearance of gait: Gait steady and fast  without the use of an assistive device.  Intervention(s) required? No   DME/home health order needed?  No    Depression Screen PHQ 2/9 Scores 02/11/2020 08/11/2019 07/31/2018 01/22/2018  PHQ - 2 Score 2 0 0 0  PHQ- 9 Score 5 2 0 3     Cognitive Function     6CIT Screen 07/20/2017  What Year? 0 points  What month? 0 points  What time? 0 points  Count back from 20 0 points  Months in reverse 0 points  Repeat phrase 0 points  Total Score 0    Immunization History  Administered Date(s) Administered  . Fluad Quad(high Dose 65+) 06/11/2019  . Influenza, High Dose Seasonal PF 06/07/2017, 06/17/2018  . Influenza,inj,Quad PF,6+ Mos 06/15/2015  . Influenza-Unspecified 06/19/2016  . PFIZER SARS-COV-2 Vaccination 09/27/2019, 10/19/2019  . Pneumococcal Conjugate-13 12/30/2013  . Pneumococcal Polysaccharide-23 11/26/2000   . Td 12/18/2003    Qualifies for Shingles Vaccine? Yes  Zostavax completed n/a. Due for Shingrix. Education has been provided regarding the importance of this vaccine. Pt has been advised to call insurance company to determine out of pocket expense. Advised may also receive vaccine at local pharmacy or Health Dept. Verbalized acceptance and understanding.  Tdap: Although this vaccine is not a covered service during a Wellness Exam, does the patient still wish to receive this vaccine today?  No .  Education has been provided regarding the importance of this vaccine. Advised may receive this vaccine at local pharmacy or Health Dept. Aware to provide a copy of the vaccination record if obtained from local pharmacy or Health Dept. Verbalized acceptance and understanding.  Flu Vaccine: up to date   Pneumococcal Vaccine: up to date   Covid-19 Vaccine: Completed vaccines  Screening Tests Health Maintenance  Topic Date Due  . TETANUS/TDAP  02/10/2021 (Originally 12/17/2013)  . INFLUENZA VACCINE  04/04/2020  . DEXA SCAN  Completed  . COVID-19 Vaccine  Completed  . PNA vac Low Risk Adult  Completed    Cancer Screenings:  Colorectal Screening: no longer required   Mammogram: no longer required   Bone Density: no longer required   Lung Cancer Screening: (Low Dose CT Chest recommended if Age 75-80 years, 30 pack-year currently smoking OR have quit w/in 15years.) does qualify.   Lung Cancer Screening Referral: An Epic message has been sent to Burgess Estelle, RN (Oncology Nurse Navigator) regarding the possible need for this exam. Raquel Sarna will review the patient's chart to determine if the patient truly qualifies for the exam. If the patient qualifies, Raquel Sarna will order the Low Dose CT of the chest to facilitate the scheduling of this exam.  Additional Screening:  Hepatitis C Screening: does not qualify  Vision Screening: Recommended annual ophthalmology exams for early detection of glaucoma  and other disorders of the eye. Is the patient up to date with their  annual eye exam?  Yes  Who is the provider or what is the name of the office in which the pt attends annual eye exams? [facility]   Dental Screening: Recommended annual dental exams for proper oral hygiene  Community Resource Referral:  CRR required this visit?  No       Plan:  I have personally reviewed and addressed the Medicare Annual Wellness questionnaire and have noted the following in the patient's chart:  A. Medical and social history B. Use of alcohol, tobacco or illicit drugs  C. Current medications and supplements D. Functional ability and status E.  Nutritional status F.  Physical activity G. Advance directives H. List of other physicians I.  Hospitalizations, surgeries, and ER visits in previous 12 months J.  [place] such as hearing and vision if needed, cognitive and depression L. Referrals and appointments   In addition, I have reviewed and discussed with patient certain preventive protocols, quality metrics, and best practice recommendations. A written personalized care plan for preventive services as well as general preventive health recommendations were provided to patient.  Signed,    Bevelyn Ngo, LPN  12/04/7060 Nurse Health Advisor   Nurse Notes: none

## 2020-02-11 NOTE — Assessment & Plan Note (Signed)
Stable and WNL, continue current regimen 

## 2020-02-11 NOTE — Assessment & Plan Note (Signed)
Pt opting to d/c protonix due to concern regarding some information she read about kidney damage with long term use.

## 2020-02-11 NOTE — Assessment & Plan Note (Signed)
Stable and well controlled, continue current regimen 

## 2020-02-11 NOTE — Assessment & Plan Note (Signed)
Recheck lipids, adjust as needed. Continue lifestyle modifications 

## 2020-02-11 NOTE — Assessment & Plan Note (Signed)
Stable and under good control, continue current regimen 

## 2020-02-11 NOTE — Assessment & Plan Note (Signed)
Stable on OTC vitamin regimen, continue current regimen

## 2020-02-12 ENCOUNTER — Telehealth: Payer: Self-pay

## 2020-02-12 LAB — CBC WITH DIFFERENTIAL/PLATELET
Basophils Absolute: 0.1 10*3/uL (ref 0.0–0.2)
Basos: 1 %
EOS (ABSOLUTE): 0 10*3/uL (ref 0.0–0.4)
Eos: 1 %
Hematocrit: 42.1 % (ref 34.0–46.6)
Hemoglobin: 14.2 g/dL (ref 11.1–15.9)
Immature Grans (Abs): 0 10*3/uL (ref 0.0–0.1)
Immature Granulocytes: 0 %
Lymphocytes Absolute: 3.4 10*3/uL — ABNORMAL HIGH (ref 0.7–3.1)
Lymphs: 41 %
MCH: 34.5 pg — ABNORMAL HIGH (ref 26.6–33.0)
MCHC: 33.7 g/dL (ref 31.5–35.7)
MCV: 102 fL — ABNORMAL HIGH (ref 79–97)
Monocytes Absolute: 0.6 10*3/uL (ref 0.1–0.9)
Monocytes: 7 %
NRBC: 1 % — ABNORMAL HIGH (ref 0–0)
Neutrophils Absolute: 4.2 10*3/uL (ref 1.4–7.0)
Neutrophils: 50 %
Platelets: 179 10*3/uL (ref 150–450)
RBC: 4.12 x10E6/uL (ref 3.77–5.28)
RDW: 14.5 % (ref 11.7–15.4)
WBC: 8.4 10*3/uL (ref 3.4–10.8)

## 2020-02-12 LAB — COMPREHENSIVE METABOLIC PANEL
ALT: 15 IU/L (ref 0–32)
AST: 25 IU/L (ref 0–40)
Albumin/Globulin Ratio: 1.9 (ref 1.2–2.2)
Albumin: 4.9 g/dL — ABNORMAL HIGH (ref 3.5–4.6)
Alkaline Phosphatase: 83 IU/L (ref 48–121)
BUN/Creatinine Ratio: 13 (ref 12–28)
BUN: 11 mg/dL (ref 10–36)
Bilirubin Total: 0.9 mg/dL (ref 0.0–1.2)
CO2: 25 mmol/L (ref 20–29)
Calcium: 9.9 mg/dL (ref 8.7–10.3)
Chloride: 100 mmol/L (ref 96–106)
Creatinine, Ser: 0.83 mg/dL (ref 0.57–1.00)
GFR calc Af Amer: 69 mL/min/{1.73_m2} (ref >59)
GFR calc non Af Amer: 60 mL/min/{1.73_m2} (ref >59)
Globulin, Total: 2.6 g/dL (ref 1.5–4.5)
Glucose: 117 mg/dL — ABNORMAL HIGH (ref 65–99)
Potassium: 4 mmol/L (ref 3.5–5.2)
Sodium: 145 mmol/L — ABNORMAL HIGH (ref 134–144)
Total Protein: 7.5 g/dL (ref 6.0–8.5)

## 2020-02-12 LAB — LIPID PANEL W/O CHOL/HDL RATIO
Cholesterol, Total: 196 mg/dL (ref 100–199)
HDL: 58 mg/dL (ref >39)
LDL Chol Calc (NIH): 119 mg/dL — ABNORMAL HIGH (ref 0–99)
Triglycerides: 106 mg/dL (ref 0–149)
VLDL Cholesterol Cal: 19 mg/dL (ref 5–40)

## 2020-02-12 LAB — TSH: TSH: 2.59 u[IU]/mL (ref 0.450–4.500)

## 2020-02-12 NOTE — Telephone Encounter (Signed)
PA for Hydrocortisone suppositories initiated and submitted via Cover My Meds. Key: [id number]

## 2020-02-14 LAB — URINE CULTURE, REFLEX

## 2020-02-14 LAB — UA/M W/RFLX CULTURE, ROUTINE
Bilirubin, UA: NEGATIVE
Glucose, UA: NEGATIVE
Ketones, UA: NEGATIVE
Nitrite, UA: NEGATIVE
Protein,UA: NEGATIVE
Specific Gravity, UA: 1.015 (ref 1.005–1.030)
Urobilinogen, Ur: 0.2 mg/dL (ref 0.2–1.0)
pH, UA: 7 (ref 5.0–7.5)

## 2020-02-14 LAB — MICROSCOPIC EXAMINATION

## 2020-02-15 ENCOUNTER — Other Ambulatory Visit: Payer: Self-pay

## 2020-02-15 ENCOUNTER — Emergency Department
Admission: EM | Admit: 2020-02-15 | Discharge: 2020-02-15 | Disposition: A | Discharge status: Home / Self Care | Payer: PPO | Attending: Emergency Medicine | Admitting: Emergency Medicine

## 2020-02-15 ENCOUNTER — Encounter: Payer: Self-pay | Admitting: Emergency Medicine

## 2020-02-15 DIAGNOSIS — R531 Weakness: Secondary | ICD-10-CM | POA: Insufficient documentation

## 2020-02-15 DIAGNOSIS — I10 Essential (primary) hypertension: Secondary | ICD-10-CM | POA: Diagnosis not present

## 2020-02-15 DIAGNOSIS — Z7901 Long term (current) use of anticoagulants: Secondary | ICD-10-CM | POA: Diagnosis not present

## 2020-02-15 DIAGNOSIS — K649 Unspecified hemorrhoids: Secondary | ICD-10-CM | POA: Diagnosis not present

## 2020-02-15 DIAGNOSIS — Z79899 Other long term (current) drug therapy: Secondary | ICD-10-CM | POA: Diagnosis not present

## 2020-02-15 DIAGNOSIS — E039 Hypothyroidism, unspecified: Secondary | ICD-10-CM | POA: Insufficient documentation

## 2020-02-15 DIAGNOSIS — K625 Hemorrhage of anus and rectum: Secondary | ICD-10-CM | POA: Diagnosis present

## 2020-02-15 LAB — COMPREHENSIVE METABOLIC PANEL
ALT: 16 U/L (ref 0–44)
AST: 30 U/L (ref 15–41)
Albumin: 4.2 g/dL (ref 3.5–5.0)
Alkaline Phosphatase: 61 U/L (ref 38–126)
Anion gap: 8 (ref 5–15)
BUN: 9 mg/dL (ref 8–23)
CO2: 26 mmol/L (ref 22–32)
Calcium: 9.3 mg/dL (ref 8.9–10.3)
Chloride: 106 mmol/L (ref 98–111)
Creatinine, Ser: 0.9 mg/dL (ref 0.44–1.00)
GFR calc Af Amer: >60 mL/min (ref >60)
GFR calc non Af Amer: 54 mL/min — ABNORMAL LOW (ref >60)
Glucose, Bld: 157 mg/dL — ABNORMAL HIGH (ref 70–99)
Potassium: 3.7 mmol/L (ref 3.5–5.1)
Sodium: 140 mmol/L (ref 135–145)
Total Bilirubin: 1.1 mg/dL (ref 0.3–1.2)
Total Protein: 7.5 g/dL (ref 6.5–8.1)

## 2020-02-15 LAB — CBC
HCT: 38.4 % (ref 36.0–46.0)
Hemoglobin: 12.8 g/dL (ref 12.0–15.0)
MCH: 33.8 pg (ref 26.0–34.0)
MCHC: 33.3 g/dL (ref 30.0–36.0)
MCV: 101.3 fL — ABNORMAL HIGH (ref 80.0–100.0)
Platelets: 175 10*3/uL (ref 150–400)
RBC: 3.79 MIL/uL — ABNORMAL LOW (ref 3.87–5.11)
RDW: 14.6 % (ref 11.5–15.5)
WBC: 7.1 10*3/uL (ref 4.0–10.5)
nRBC: 1 % — ABNORMAL HIGH (ref 0.0–0.2)

## 2020-02-15 LAB — TYPE AND SCREEN
ABO/RH(D): A POS
Antibody Screen: NEGATIVE

## 2020-02-15 MED ORDER — DOCUSATE SODIUM 100 MG PO CAPS
100.0000 mg | ORAL_CAPSULE | Freq: Two times a day (BID) | ORAL | 2 refills | Status: DC
Start: 2020-02-15 — End: 2020-05-13

## 2020-02-15 NOTE — ED Notes (Signed)
Pt up to use bathroom 

## 2020-02-15 NOTE — Discharge Instructions (Signed)
The bleeding that you see is related to hemorrhoids. You may start using an over-the-counter cream that contains hydrocortisone and lidocaine to help reduce the inflammation and chance of bleeding. Please also start taking Colace as a stool softener and Metamucil as a fiber supplement to ease the passage of stool. Please schedule follow-up with a general surgeon for further evaluation and return to the ER for any worsening bleeding.

## 2020-02-15 NOTE — ED Provider Notes (Signed)
[facility] Emergency Department Provider Note   ____________________________________________   First MD Initiated Contact with Patient 02/15/20 1247     (approximate)  I have reviewed the triage vital signs and the nursing notes.   HISTORY  Chief Complaint Rectal Bleeding    HPI Mckenzie Gutierrez is a 86 y.o. female with possible history of hypertension, atrial fibrillation on Eliquis, hyperlipidemia, and GERD who presents to the ED for rectal bleeding. Patient reports that she has been dealing with intermittent rectal bleeding for about the past 5 days. She states that whenever she strains to have a bowel movement or to pass urine she will notice leakage of bright red blood from her rectum. She was evaluated at her PCPs office for this problem a few days ago and told that she had bleeding hemorrhoids. At that time, she was started on Anusol suppositories, which she has been taking without relief. She had an episode just prior to arrival where she was straining to urinate and noticed bright red blood from her rectum. She reports feeling generally weak since onset of bleeding, but denies any lightheadedness, chest pain, or shortness of breath. She denies any abdominal pain, rectal pain, or vomiting.        Past Medical History:  Diagnosis Date  . Anxiety   . Atrial premature beats   . Bradycardia   . Carotid stenosis   . Depression   . GERD (gastroesophageal reflux disease)   . Hyperlipidemia   . Hypertension   . Low back pain   . Osteoporosis   . Thyroid disease   . Urinary frequency     Patient Active Problem List   Diagnosis Date Noted  . Long term (current) use of anticoagulants 07/24/2017  . Urge incontinence of urine 02/06/2017  . Fatigue 07/19/2016  . Atrial fibrillation ([place]) 07/19/2016  . Low back pain 12/03/2015  . HLD (hyperlipidemia) 07/12/2015  . Hypothyroid 07/12/2015  . Osteoporosis 07/12/2015  . Carotid stenosis 07/12/2015    . Major depression in complete remission ([place]) 07/12/2015  . Hypertension 07/12/2015  . Alopecia 07/12/2015  . GERD (gastroesophageal reflux disease) 07/12/2015  . Carotid artery plaque 02/03/2014  . Heart valve disease 02/03/2014    Past Surgical History:  Procedure Laterality Date  . CARPAL TUNNEL RELEASE    . WRIST SURGERY      Prior to Admission medications   Medication Sig Start Date End Date Taking? Authorizing Provider  amLODipine (NORVASC) 5 MG tablet TAKE 1 TABLET BY MOUTH DAILY 11/10/19  Yes Volney American, PA-C  apixaban (ELIQUIS) 2.5 MG TABS tablet Take 2.5 mg by mouth 2 (two) times daily. 07/20/16  Yes [provider]  furosemide (LASIX) 20 MG tablet TAKE ONE TABLET EVERY DAY 02/06/20  Yes Volney American, PA-C  hydrocortisone (ANUSOL-HC) 25 MG suppository Place 1 suppository (25 mg total) rectally 2 (two) times daily as needed for hemorrhoids or anal itching. 02/11/20  Yes Volney American, PA-C  levothyroxine (SYNTHROID) 75 MCG tablet TAKE 1 TABLET EVERY DAY ON EMPTY STOMACHWITH A GLASS OF WATER AT LEAST 30-60 MINBEFORE BREAKFAST 02/06/20  Yes Volney American, PA-C  losartan (COZAAR) 100 MG tablet TAKE ONE TABLET EVERY DAY 02/06/20  Yes Volney American, PA-C  pantoprazole (PROTONIX) 20 MG tablet Take 20 mg by mouth daily.    Yes [provider]  beta carotene w/minerals (OCUVITE) tablet Take 1 tablet by mouth daily.    [provider]  Biotin 5000 MCG  CAPS Take by mouth daily.    [provider]  citalopram (CELEXA) 20 MG tablet Take 1 tablet (20 mg total) by mouth daily. 02/06/19   Volney American, PA-C  docusate sodium (COLACE) 100 MG capsule Take 1 capsule (100 mg total) by mouth 2 (two) times daily. 02/15/20 02/14/21  Blake Divine, MD  Multiple Vitamin (MULTIVITAMIN) tablet Take 1 tablet by mouth daily.    [provider]  polyethylene glycol (MIRALAX / GLYCOLAX) packet MIX ONE PACKAGE IN  LIQUID AND TAKE DAILY 05/21/17   Kathrine Haddock, NP    Allergies Celebrex [celecoxib], Codeine sulfate, Hydrochlorothiazide, Hytrin [terazosin], Plendil [felodipine], Nitrofuran derivatives, and Nitrofurantoin  Family History  Problem Relation Age of Onset  . Heart disease Mother   . Stroke Father   . Diabetes Father   . Stroke Sister     Social History Social History   Tobacco Use  . Smoking status: Never Smoker  . Smokeless tobacco: Never Used  Vaping Use  . Vaping Use: Never used  Substance Use Topics  . Alcohol use: No    Alcohol/week: 0.0 standard drinks  . Drug use: No    Review of Systems  Constitutional: No fever/chills Eyes: No visual changes. ENT: No sore throat. Cardiovascular: Denies chest pain. Respiratory: Denies shortness of breath. Gastrointestinal: No abdominal pain.  No nausea, no vomiting.  No diarrhea.  No constipation. Positive for rectal bleeding. Genitourinary: Negative for dysuria. Musculoskeletal: Negative for back pain. Skin: Negative for rash. Neurological: Negative for headaches, focal weakness or numbness.  ____________________________________________   PHYSICAL EXAM:  VITAL SIGNS: ED Triage Vitals  Enc Vitals Group     BP 02/15/20 1240 (!) 161/70     Pulse Rate 02/15/20 1240 73     Resp 02/15/20 1240 16     Temp 02/15/20 1240 97.9 F (36.6 C)     Temp Source 02/15/20 1240 Oral     SpO2 02/15/20 1240 96 %     Weight 02/15/20 1241 197 lb 1.5 oz (89.4 kg)     Height 02/15/20 1241 5\' 5"  (1.651 m)     Head Circumference --      Peak Flow --      Pain Score --      Pain Loc --      Pain Edu? --      Excl. in [place]? --     Constitutional: Alert and oriented. Eyes: Conjunctivae are normal. Head: Atraumatic. Nose: No congestion/rhinnorhea. Mouth/Throat: Mucous membranes are moist. Neck: Normal ROM Cardiovascular: Normal rate, regular rhythm. Grossly normal heart sounds. Respiratory: Normal respiratory effort.  No retractions.  Lungs CTAB. Gastrointestinal: Soft and nontender. No distention. Inflamed but nonbleeding hemorrhoids noted, stool is guaiac negative. Genitourinary: deferred Musculoskeletal: No lower extremity tenderness nor edema. Neurologic:  Normal speech and language. No gross focal neurologic deficits are appreciated. Skin:  Skin is warm, dry and intact. No rash noted. Psychiatric: Mood and affect are normal. Speech and behavior are normal.  ____________________________________________   LABS (all labs ordered are listed, but only abnormal results are displayed)  Labs Reviewed  COMPREHENSIVE METABOLIC PANEL - Abnormal; Notable for the following components:      Result Value   Glucose, Bld 157 (*)    GFR calc non Af Amer 54 (*)    All other components within normal limits  CBC - Abnormal; Notable for the following components:   RBC 3.79 (*)    MCV 101.3 (*)    nRBC 1.0 (*)  All other components within normal limits  POC OCCULT BLOOD, ED  TYPE AND SCREEN    PROCEDURES  Procedure(s) performed (including Critical Care):  Procedures   ____________________________________________   INITIAL IMPRESSION / ASSESSMENT AND PLAN / ED COURSE       86 year old female with past medical history of hypertension, A. fib on Eliquis, hyperlipidemia, and GERD presents to the ED complaining of intermittent rectal bleeding for the past 5 days. She notices bleeding with any straining but has not noticed any blood mixed into her stool. Rectal exam shows hemorrhoids that are not actively bleeding and stool is guaiac negative. Hemoglobin shows very slight drop compared to prior and I doubt significant GI bleeding at this time. She would benefit from outpatient referral to general surgery as well as starting of fiber supplement, stool softener and hemorrhoid cream. She is otherwise appropriate for further management as an outpatient, was counseled to return to the ED for new or worsening symptoms. Patient and  family agree with plan.      ____________________________________________   FINAL CLINICAL IMPRESSION(S) / ED DIAGNOSES  Final diagnoses:  Bleeding hemorrhoids     ED Discharge Orders         Ordered    docusate sodium (COLACE) 100 MG capsule  2 times daily     Discontinue  Reprint     02/15/20 1343           Note:  This document was prepared using Dragon voice recognition software and may include unintentional dictation errors.   Blake Divine, MD 02/15/20 1350

## 2020-02-15 NOTE — ED Triage Notes (Signed)
Presents with rectal bleeding  Family states bleeding started last Tuesday

## 2020-02-16 ENCOUNTER — Other Ambulatory Visit: Payer: Self-pay | Admitting: Family Medicine

## 2020-02-16 ENCOUNTER — Telehealth: Payer: Self-pay | Admitting: Family Medicine

## 2020-02-16 MED ORDER — SULFAMETHOXAZOLE-TRIMETHOPRIM 800-160 MG PO TABS: 1.0000 | ORAL_TABLET | Freq: Two times a day (BID) | ORAL | 0 refills | Status: DC

## 2020-02-16 NOTE — Telephone Encounter (Signed)
PA denied. Can we send in anything else for this patient instead?

## 2020-02-16 NOTE — Telephone Encounter (Signed)
Pt's daughter calling, pt present. Wished to clarify pt was to take the prescription med(bactrim DS) for UTI. States pt told her "The urine was clear." Note from [place] read, daughter verbalizes understanding. Did pick up prescription and will start antibiotic as ordered.

## 2020-02-17 NOTE — Telephone Encounter (Signed)
Did it give a reason why or alternatives that are covered?

## 2020-02-17 NOTE — Telephone Encounter (Signed)
Awaiting denial letter from insurance.

## 2020-02-19 ENCOUNTER — Ambulatory Visit: Payer: PPO | Admitting: General Surgery

## 2020-02-19 ENCOUNTER — Other Ambulatory Visit: Payer: Self-pay

## 2020-02-19 ENCOUNTER — Encounter: Payer: Self-pay | Admitting: General Surgery

## 2020-02-19 VITALS — BP 153/74 | HR 80 | Temp 97.5°F | Ht 66.0 in | Wt 197.0 lb

## 2020-02-19 DIAGNOSIS — K649 Unspecified hemorrhoids: Secondary | ICD-10-CM

## 2020-02-19 NOTE — Progress Notes (Signed)
Patient ID: Mckenzie Gutierrez, female   DOB: 1921-09-22, 86 y.o.   MRN: [id number]  Chief Complaint  Patient presents with  . Hemorrhoids    HPI Mckenzie Gutierrez is a 86 y.o. female.   She is here today for emergency room follow-up.  She presented there over the weekend due to concerns about bleeding from her hemorrhoids.  She apparently has had hemorrhoids for many years, but has found them to be quite manageable.  About 3 weeks ago, she began noticing blood on her toilet tissue as well as in the bowl after straining to have bowel movements.  She denies constipation, but says that she sometimes does have to push hard to have a bowel movement.  She does not have any prior history of thrombosed hemorrhoids.  She denies any pain, itching, or burning sensation.  She indicates that she does perform sitz bath's and has been using Anusol suppositories as prescribed by the emergency department physician.  She also takes Colace, Metamucil, and MiraLAX.  She denies any family history of colon cancer and it has been more than 5 years since her last colonoscopy, in light of her advanced age.  She does have paroxysmal atrial fibrillation and is on Eliquis for anticoagulation.  She has never been lightheaded due to blood loss and a review of her electronic medical record demonstrates that her hematocrit has remained stable, between about 36-40.   Past Medical History:  Diagnosis Date  . Anxiety   . Atrial premature beats   . Bradycardia   . Carotid stenosis   . Depression   . GERD (gastroesophageal reflux disease)   . Hyperlipidemia   . Hypertension   . Low back pain   . Osteoporosis   . Thyroid disease   . Urinary frequency     Past Surgical History:  Procedure Laterality Date  . CARPAL TUNNEL RELEASE    . WRIST SURGERY      Family History  Problem Relation Age of Onset  . Heart disease Mother   . Stroke Father   . Diabetes Father   . Stroke Sister     Social History Social History    Tobacco Use  . Smoking status: Never Smoker  . Smokeless tobacco: Never Used  Vaping Use  . Vaping Use: Never used  Substance Use Topics  . Alcohol use: No    Alcohol/week: 0.0 standard drinks  . Drug use: No    Allergies  Allergen Reactions  . Celebrex [Celecoxib] Other (See Comments)    Hard stools  . Codeine Sulfate Other (See Comments)  . Hydrochlorothiazide   . Hytrin [Terazosin]   . Plendil [Felodipine] Diarrhea and Nausea Only  . Nitrofuran Derivatives Swelling and Rash  . Nitrofurantoin Rash and Swelling    Current Outpatient Medications  Medication Sig Dispense Refill  . amLODipine (NORVASC) 5 MG tablet TAKE 1 TABLET BY MOUTH DAILY 90 tablet 1  . apixaban (ELIQUIS) 2.5 MG TABS tablet Take 2.5 mg by mouth 2 (two) times daily.    . beta carotene w/minerals (OCUVITE) tablet Take 1 tablet by mouth daily.    . Biotin 5000 MCG CAPS Take by mouth daily.    Marland Kitchen docusate sodium (COLACE) 100 MG capsule Take 1 capsule (100 mg total) by mouth 2 (two) times daily. 60 capsule 2  . furosemide (LASIX) 20 MG tablet TAKE ONE TABLET EVERY DAY 90 tablet 1  . hydrocortisone (ANUSOL-HC) 25 MG suppository Place 1 suppository (25 mg total) rectally 2 (two)  times daily as needed for hemorrhoids or anal itching. 12 suppository 2  . levothyroxine (SYNTHROID) 75 MCG tablet TAKE 1 TABLET EVERY DAY ON EMPTY STOMACHWITH A GLASS OF WATER AT LEAST 30-60 MINBEFORE BREAKFAST 90 tablet 0  . losartan (COZAAR) 100 MG tablet TAKE ONE TABLET EVERY DAY 90 tablet 1  . Multiple Vitamin (MULTIVITAMIN) tablet Take 1 tablet by mouth daily.    . pantoprazole (PROTONIX) 20 MG tablet Take 20 mg by mouth daily.     . polyethylene glycol (MIRALAX / GLYCOLAX) packet MIX ONE PACKAGE IN LIQUID AND TAKE DAILY 28 each 12  . psyllium (METAMUCIL) 58.6 % packet Take 1 packet by mouth daily.     No current facility-administered medications for this visit.    Review of Systems Review of Systems  Constitutional: Positive  for fatigue.  HENT: Positive for hearing loss.   Gastrointestinal: Positive for blood in stool.  All other systems reviewed and are negative.   Blood pressure (!) 153/74, pulse 80, temperature (!) 97.5 F (36.4 C), height 5\' 6"  (1.676 m), weight 197 lb (89.4 kg), SpO2 95 %. Body mass index is 31.8 kg/m.   Physical Exam Physical Exam Exam conducted with a chaperone present.  Constitutional:      General: She is not in acute distress.    Appearance: Normal appearance. She is obese.  HENT:     Head: Normocephalic and atraumatic.     Ears:     Comments: Bilateral hearing aids present.    Nose:     Comments: Covered with a mask secondary to COVID-19 precautions    Mouth/Throat:     Comments: Covered with a mask secondary to COVID-19 precautions Eyes:     General:        Right eye: No discharge.        Left eye: No discharge.  Neck:     Comments: No dominant thyroid masses or palpable thyromegaly appreciated. Cardiovascular:     Rate and Rhythm: Normal rate. Rhythm irregular.     Heart sounds: Murmur heard.   Pulmonary:     Effort: Pulmonary effort is normal.     Breath sounds: Normal breath sounds.  Abdominal:     General: Abdomen is flat. Bowel sounds are normal.     Palpations: Abdomen is soft.  Genitourinary:    Exam position: Knee-chest position.     Comments: There are scraps of toilet tissue around the anus with a small amount of dried blood present.  These were removed.  She does have small external hemorrhoids without stigmata of recent bleeding, aside from the toilet tissue.  I did not perform a rectal exam; 1 was performed in the emergency department and no gross blood was appreciated. Musculoskeletal:     Right lower leg: Edema present.     Left lower leg: Edema present.  Lymphadenopathy:     Cervical: No cervical adenopathy.  Skin:    General: Skin is warm and dry.  Neurological:     General: No focal deficit present.     Mental Status: She is alert and  oriented to person, place, and time.  Psychiatric:        Mood and Affect: Mood normal.        Behavior: Behavior normal.     Data Reviewed Via the electronic medical record, I reviewed her complete blood count results over the past several years.  It has remained stable, just that is occurring is not hemodynamically significant. Results for Mckenzie Gutierrez,  Mckenzie Gutierrez (MRN [id number]) as of 02/19/2020 09:07  Ref. Range 03/27/2019 16:09 08/11/2019 10:59 08/11/2019 16:02 02/11/2020 10:44 02/11/2020 10:48 02/15/2020 12:47  WBC Latest Ref Range: 4.0 - 10.5 K/uL 7.3  8.8  8.4 7.1  RBC Latest Ref Range: 3.87 - 5.11 MIL/uL 3.85 3-10 (A) 4.11 3-10 (A) 4.12 3.79 (L)  Hemoglobin Latest Ref Range: 12.0 - 15.0 g/dL 13.2  13.5  14.2 12.8  HCT Latest Ref Range: 36 - 46 % 38.8  40.5  42.1 38.4  MCV Latest Ref Range: 80.0 - 100.0 fL 101 (H)  99 (H)  102 (H) 101.3 (H)  MCH Latest Ref Range: 26.0 - 34.0 pg 34.3 (H)  32.8  34.5 (H) 33.8  MCHC Latest Ref Range: 30.0 - 36.0 g/dL 34.0  33.3  33.7 33.3  RDW Latest Ref Range: 11.5 - 15.5 % 14.7  14.5  14.5 14.6  Platelets Latest Ref Range: 150 - 400 K/uL 160  163  179 175  nRBC Latest Ref Range: 0.0 - 0.2 %   1 (H)  1 (H) 1.0 (H)   I reviewed the report from her emergency department visit on 02/15/2020.  This describes that she was having intermittent rectal bleeding for 5 days prior to her presentation.  She was apparently seen by her primary care provider was prescribed Anusol suppositories.  The emergency department provider's note confirms that these episodes seem to coincide with straining to eliminate.  He recommended a fiber supplement and stool softener.  I also reviewed her cardiologist notes via the electronic medical record.  She sees Dr. Clayborn Bigness.  She is on Eliquis for paroxysmal atrial fibrillation.  Assessment This is a 86 year old woman who has had hemorrhoids for many years.  She has recently been experiencing some intermittent bleeding that seems to coincide with  straining to have a bowel movement.  Unfortunately, she is on Eliquis, which is likely contributing to her bleeding.  She is not, however, having hemodynamically significant bleeding, nor is she experiencing pain, burning, or itching.  This suggests that blood may be from internal hemorrhoids, rather than the few small external hemorrhoids visible on exam.  Her daughter, who accompanied her today, thinks that predominantly, Mckenzie Gutierrez is disturbed by seeing blood in the toilet and on her toilet tissue.  Plan I have recommended that Mckenzie Gutierrez initiate Preparation H suppositories, as the phenylephrine component may help shrink internal hemorrhoidal vessels.  She should continue with a bowel regimen to try and avoid having to strain to defecate.  Unfortunately, I do not think it is likely that she would be able to discontinue Eliquis due to paroxysmal atrial fibrillation and history of CVA in 2010.  We have placed referral to gastroenterology to have her evaluated for potential banding of internal hemorrhoids.  I think surgical treatment is likely to bring more complications and risks than benefit to this patient and therefore I will see her on an as-needed basis.    Fredirick Maudlin 02/19/2020, 10:18 AM

## 2020-02-19 NOTE — Patient Instructions (Addendum)
. We would like you to use the Preparation H suppositories twice daily. You may use Preparation H cream to the external hemorrhoids several times a day Continue your Metamucil, Miralax, and Colace daily.   We will refer you to Gastroenterology to see if you would benefit from internal hemorrhoid banding.    Hemorrhoids Hemorrhoids are swollen veins in and around the rectum or anus. There are two types of hemorrhoids:  Internal hemorrhoids. These occur in the veins that are just inside the rectum. They may poke through to the outside and become irritated and painful.  External hemorrhoids. These occur in the veins that are outside the anus and can be felt as a painful swelling or hard lump near the anus. Most hemorrhoids do not cause serious problems, and they can be managed with home treatments such as diet and lifestyle changes. If home treatments do not help the symptoms, procedures can be done to shrink or remove the hemorrhoids. What are the causes? This condition is caused by increased pressure in the anal area. This pressure may result from various things, including:  Constipation.  Straining to have a bowel movement.  Diarrhea.  Pregnancy.  Obesity.  Sitting for long periods of time.  Heavy lifting or other activity that causes you to strain.  Anal sex.  Riding a bike for a long period of time. What are the signs or symptoms? Symptoms of this condition include:  Pain.  Anal itching or irritation.  Rectal bleeding.  Leakage of stool (feces).  Anal swelling.  One or more lumps around the anus. How is this diagnosed? This condition can often be diagnosed through a visual exam. Other exams or tests may also be done, such as:  An exam that involves feeling the rectal area with a gloved hand (digital rectal exam).  An exam of the anal canal that is done using a small tube (anoscope).  A blood test, if you have lost a significant amount of blood.  A test to  look inside the colon using a flexible tube with a camera on the end (sigmoidoscopy or colonoscopy). How is this treated? This condition can usually be treated at home. However, various procedures may be done if dietary changes, lifestyle changes, and other home treatments do not help your symptoms. These procedures can help make the hemorrhoids smaller or remove them completely. Some of these procedures involve surgery, and others do not. Common procedures include:  Rubber band ligation. Rubber bands are placed at the base of the hemorrhoids to cut off their blood supply.  Sclerotherapy. Medicine is injected into the hemorrhoids to shrink them.  Infrared coagulation. A type of light energy is used to get rid of the hemorrhoids.  Hemorrhoidectomy surgery. The hemorrhoids are surgically removed, and the veins that supply them are tied off.  Stapled hemorrhoidopexy surgery. The surgeon staples the base of the hemorrhoid to the rectal wall. Follow these instructions at home: Eating and drinking   Eat foods that have a lot of fiber in them, such as whole grains, beans, nuts, fruits, and vegetables.  Ask your health care provider about taking products that have added fiber (fiber supplements).  Reduce the amount of fat in your diet. You can do this by eating low-fat dairy products, eating less red meat, and avoiding processed foods.  Drink enough fluid to keep your urine pale yellow. Managing pain and swelling   Take warm sitz baths for 20 minutes, 3-4 times a day to ease pain and discomfort.  You may do this in a bathtub or using a portable sitz bath that fits over the toilet.  If directed, apply ice to the affected area. Using ice packs between sitz baths may be helpful. ? Put ice in a plastic bag. ? Place a towel between your skin and the bag. ? Leave the ice on for 20 minutes, 2-3 times a day. General instructions  Take over-the-counter and prescription medicines only as told by your  health care provider.  Use medicated creams or suppositories as told.  Get regular exercise. Ask your health care provider how much and what kind of exercise is best for you. In general, you should do moderate exercise for at least 30 minutes on most days of the week (150 minutes each week). This can include activities such as walking, biking, or yoga.  Go to the bathroom when you have the urge to have a bowel movement. Do not wait.  Avoid straining to have bowel movements.  Keep the anal area dry and clean. Use wet toilet paper or moist towelettes after a bowel movement.  Do not sit on the toilet for long periods of time. This increases blood pooling and pain.  Keep all follow-up visits as told by your health care provider. This is important. Contact a health care provider if you have:  Increasing pain and swelling that are not controlled by treatment or medicine.  Difficulty having a bowel movement, or you are unable to have a bowel movement.  Pain or inflammation outside the area of the hemorrhoids. Get help right away if you have:  Uncontrolled bleeding from your rectum. Summary  Hemorrhoids are swollen veins in and around the rectum or anus.  Most hemorrhoids can be managed with home treatments such as diet and lifestyle changes.  Taking warm sitz baths can help ease pain and discomfort.  In severe cases, procedures or surgery can be done to shrink or remove the hemorrhoids. This information is not intended to replace advice given to you by your health care provider. Make sure you discuss any questions you have with your health care provider. Document Revised: 01/17/2019 Document Reviewed: 01/10/2018 Elsevier Patient Education  [place].

## 2020-02-24 ENCOUNTER — Ambulatory Visit: Payer: PPO | Admitting: General Surgery

## 2020-03-03 DIAGNOSIS — E079 Disorder of thyroid, unspecified: Secondary | ICD-10-CM | POA: Diagnosis not present

## 2020-03-03 DIAGNOSIS — R011 Cardiac murmur, unspecified: Secondary | ICD-10-CM | POA: Diagnosis not present

## 2020-03-03 DIAGNOSIS — I6523 Occlusion and stenosis of bilateral carotid arteries: Secondary | ICD-10-CM | POA: Diagnosis not present

## 2020-03-03 DIAGNOSIS — I10 Essential (primary) hypertension: Secondary | ICD-10-CM | POA: Diagnosis not present

## 2020-03-03 DIAGNOSIS — R0600 Dyspnea, unspecified: Secondary | ICD-10-CM | POA: Diagnosis not present

## 2020-03-03 DIAGNOSIS — Z8673 Personal history of transient ischemic attack (TIA), and cerebral infarction without residual deficits: Secondary | ICD-10-CM | POA: Diagnosis not present

## 2020-03-03 DIAGNOSIS — I350 Nonrheumatic aortic (valve) stenosis: Secondary | ICD-10-CM | POA: Diagnosis not present

## 2020-03-03 DIAGNOSIS — W57XXXA Bitten or stung by nonvenomous insect and other nonvenomous arthropods, initial encounter: Secondary | ICD-10-CM | POA: Diagnosis not present

## 2020-03-03 DIAGNOSIS — E782 Mixed hyperlipidemia: Secondary | ICD-10-CM | POA: Diagnosis not present

## 2020-03-03 DIAGNOSIS — E6609 Other obesity due to excess calories: Secondary | ICD-10-CM | POA: Diagnosis not present

## 2020-03-03 DIAGNOSIS — K219 Gastro-esophageal reflux disease without esophagitis: Secondary | ICD-10-CM | POA: Diagnosis not present

## 2020-03-03 DIAGNOSIS — I739 Peripheral vascular disease, unspecified: Secondary | ICD-10-CM | POA: Diagnosis not present

## 2020-03-05 ENCOUNTER — Other Ambulatory Visit: Payer: Self-pay

## 2020-03-05 ENCOUNTER — Ambulatory Visit (INDEPENDENT_AMBULATORY_CARE_PROVIDER_SITE_OTHER): Payer: PPO | Admitting: Family Medicine

## 2020-03-05 ENCOUNTER — Encounter: Payer: Self-pay | Admitting: Family Medicine

## 2020-03-05 VITALS — BP 135/71 | HR 79 | Temp 98.0°F

## 2020-03-05 DIAGNOSIS — L0292 Furuncle, unspecified: Secondary | ICD-10-CM | POA: Diagnosis not present

## 2020-03-05 DIAGNOSIS — F325 Major depressive disorder, single episode, in full remission: Secondary | ICD-10-CM | POA: Diagnosis not present

## 2020-03-05 MED ORDER — DOXYCYCLINE HYCLATE 100 MG PO TABS: 100.0000 mg | ORAL_TABLET | Freq: Two times a day (BID) | ORAL | 0 refills | Status: DC

## 2020-03-05 MED ORDER — CITALOPRAM HYDROBROMIDE 20 MG PO TABS
20.0000 mg | ORAL_TABLET | Freq: Every day | ORAL | 0 refills | Status: DC
Start: 2020-03-05 — End: 2020-04-06

## 2020-03-05 NOTE — Patient Instructions (Signed)
Warm compresses and neosporin twice daily

## 2020-03-05 NOTE — Assessment & Plan Note (Signed)
Restart celexa x 1 month, f/u at that time to discuss tolerance and efficacy

## 2020-03-05 NOTE — Progress Notes (Signed)
BP 135/71 (BP Location: Left Arm, Cuff Size: Large)   Pulse 79   Temp 98 F (36.7 C) (Oral)   LMP  (LMP Unknown)   SpO2 98%    Subjective:    Patient ID: Mckenzie Gutierrez, female    DOB: 12/24/1921, 86 y.o.   MRN: [id number]  HPI: Mckenzie Gutierrez is a 86 y.o. female  Chief Complaint  Patient presents with  . Insect Bite    pt states she has bit by some black bug on Tuesday, went to the cardiologist on Wednesday and was told to use hydrocortison cream on it, states this did not help. Patient states the place does not itch but it is very sore   . Depression   Presenting today concerned about an insect bite to base of right scalp that occurred about 4 days ago. Initially was just itchy so put some steroid cream on there. Now sore, swelling up and warm to the touch. Denies fever, chills, drainage, sweats.   Her son is with her today, who is around often and helps with her care, and states she's been taking celexa very inconsistently for several years but has been off for over 6 months now. He notes she has been increasingly more depressed as she mostly stays home and does puzzles and housework. Tried an old tablet once recently and states it made her "feel awful". Denies SI/HI, anhedonia.   Depression screen [facility] 2/9 03/05/2020 02/11/2020 08/11/2019  Decreased Interest 1 1 0  Down, Depressed, Hopeless 1 1 0  PHQ - 2 Score 2 2 0  Altered sleeping 1 0 1  Tired, decreased energy 3 3 1   Change in appetite 0 0 0  Feeling bad or failure about yourself  0 0 0  Trouble concentrating 0 0 0  Moving slowly or fidgety/restless 0 0 0  Suicidal thoughts 0 0 0  PHQ-9 Score 6 5 2   Difficult doing work/chores Not difficult at all Not difficult at all -   GAD 7 : Generalized Anxiety Score 08/11/2019  Nervous, Anxious, on Edge 0  Control/stop worrying 0  Worry too much - different things 0  Trouble relaxing 0  Restless 0  Easily annoyed or irritable 0  Afraid - awful might happen 0  Total GAD 7  Score 0  Anxiety Difficulty Not difficult at all     Relevant past medical, surgical, family and social history reviewed and updated as indicated. Interim medical history since our last visit reviewed. Allergies and medications reviewed and updated.  Review of Systems  Per HPI unless specifically indicated above     Objective:    BP 135/71 (BP Location: Left Arm, Cuff Size: Large)   Pulse 79   Temp 98 F (36.7 C) (Oral)   LMP  (LMP Unknown)   SpO2 98%   Wt Readings from Last 3 Encounters:  02/19/20 197 lb (89.4 kg)  02/15/20 197 lb 1.5 oz (89.4 kg)  02/11/20 197 lb (89.4 kg)    Physical Exam Vitals and nursing note reviewed.  Constitutional:      Appearance: Normal appearance. She is not ill-appearing.  HENT:     Head: Atraumatic.  Eyes:     Extraocular Movements: Extraocular movements intact.     Conjunctiva/sclera: Conjunctivae normal.  Cardiovascular:     Rate and Rhythm: Normal rate and regular rhythm.     Heart sounds: Normal heart sounds.  Pulmonary:     Effort: Pulmonary effort is normal.  Breath sounds: Normal breath sounds.  Musculoskeletal:        General: Normal range of motion.     Cervical back: Normal range of motion and neck supple.  Skin:    General: Skin is warm.     Comments: Erythematous, edematous, tender abscess forming at site of insect bite right posterior scalp. Not yet fluctuant or draining  Neurological:     Mental Status: She is alert and oriented to person, place, and time.  Psychiatric:        Mood and Affect: Mood normal.        Thought Content: Thought content normal.        Judgment: Judgment normal.     Results for orders placed or performed during the hospital encounter of 02/15/20  Comprehensive metabolic panel  Result Value Ref Range   Sodium 140 135 - 145 mmol/L   Potassium 3.7 3.5 - 5.1 mmol/L   Chloride 106 98 - 111 mmol/L   CO2 26 22 - 32 mmol/L   Glucose, Bld 157 (H) 70 - 99 mg/dL   BUN 9 8 - 23 mg/dL    Creatinine, Ser 0.90 0.44 - 1.00 mg/dL   Calcium 9.3 8.9 - 10.3 mg/dL   Total Protein 7.5 6.5 - 8.1 g/dL   Albumin 4.2 3.5 - 5.0 g/dL   AST 30 15 - 41 U/L   ALT 16 0 - 44 U/L   Alkaline Phosphatase 61 38 - 126 U/L   Total Bilirubin 1.1 0.3 - 1.2 mg/dL   GFR calc non Af Amer 54 (L) >60 mL/min   GFR calc Af Amer >60 >60 mL/min   Anion gap 8 5 - 15  CBC  Result Value Ref Range   WBC 7.1 4.0 - 10.5 K/uL   RBC 3.79 (L) 3.87 - 5.11 MIL/uL   Hemoglobin 12.8 12.0 - 15.0 g/dL   HCT 38.4 36 - 46 %   MCV 101.3 (H) 80.0 - 100.0 fL   MCH 33.8 26.0 - 34.0 pg   MCHC 33.3 30.0 - 36.0 g/dL   RDW 14.6 11.5 - 15.5 %   Platelets 175 150 - 400 K/uL   nRBC 1.0 (H) 0.0 - 0.2 %  Type and screen [facility]  Result Value Ref Range   ABO/RH(D) A POS    Antibody Screen NEG    Sample Expiration      02/18/2020,2359 Performed at [facility], [address] [place], [place] [address]       Assessment & Plan:   Problem List Items Addressed This Visit      Other   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 Tx with doxycycline, warm compresses, neosporin       Follow up plan: Return in about 4 weeks (around 04/02/2020) for mood f/u.

## 2020-03-10 ENCOUNTER — Telehealth: Payer: Self-pay | Admitting: Family Medicine

## 2020-03-10 NOTE — Telephone Encounter (Signed)
Please check on pt, if worsening may need appt to see if the area needs to be drained/cultured  Copied from [place] [id number]. Topic: General - Other >> Mar 10, 2020 10:44 AM Alanda Slim E wrote: Reason for CRM: Pts daughter is in town with the Pt for the day and would like to speak with Apolonio Schneiders about the spot on the Pts head that she came in for the other day/ the spot is not any better and looks to be getting worse/ please advise

## 2020-03-10 NOTE — Telephone Encounter (Signed)
Spoke with pt daghter, appt made for 03/11/2020.

## 2020-03-11 ENCOUNTER — Encounter: Payer: Self-pay | Admitting: Family Medicine

## 2020-03-11 ENCOUNTER — Ambulatory Visit (INDEPENDENT_AMBULATORY_CARE_PROVIDER_SITE_OTHER): Payer: PPO | Admitting: Family Medicine

## 2020-03-11 ENCOUNTER — Other Ambulatory Visit: Payer: Self-pay

## 2020-03-11 VITALS — BP 143/79 | HR 75 | Temp 97.5°F | Wt 198.0 lb

## 2020-03-11 DIAGNOSIS — L0291 Cutaneous abscess, unspecified: Secondary | ICD-10-CM

## 2020-03-11 NOTE — Patient Instructions (Signed)
Warm compresses off and on during the day Neosporin twice daily Complete antibiotics Call us back if you are not much better by Monday or at any point that you get worse

## 2020-03-11 NOTE — Progress Notes (Signed)
BP (!) 143/79   Pulse 75   Temp (!) 97.5 F (36.4 C) (Oral)   Wt 198 lb (89.8 kg)   LMP  (LMP Unknown)   SpO2 97%   BMI 31.96 kg/m    Subjective:    Patient ID: Mckenzie Gutierrez, female    DOB: 02/29/1922, 86 y.o.   MRN: [id number]  HPI: Mckenzie Gutierrez is a 86 y.o. female  Chief Complaint  Patient presents with  . Mass    back of head. pt states not feeling bettter from last visit   Presenting today following up on insect bite to right posterior scalp. Started doxycycline several days ago but area continues to grow larger, more tender, and red and warm to the touch. Draining and crusting some. Has not been trying anything topically at this time. Denies fever, chills, sweats, malaise.   Relevant past medical, surgical, family and social history reviewed and updated as indicated. Interim medical history since our last visit reviewed. Allergies and medications reviewed and updated.  Review of Systems  Per HPI unless specifically indicated above     Objective:    BP (!) 143/79   Pulse 75   Temp (!) 97.5 F (36.4 C) (Oral)   Wt 198 lb (89.8 kg)   LMP  (LMP Unknown)   SpO2 97%   BMI 31.96 kg/m   Wt Readings from Last 3 Encounters:  03/11/20 198 lb (89.8 kg)  02/19/20 197 lb (89.4 kg)  02/15/20 197 lb 1.5 oz (89.4 kg)    Physical Exam Vitals and nursing note reviewed.  Constitutional:      Appearance: Normal appearance. She is not ill-appearing.  HENT:     Head: Atraumatic.  Eyes:     Extraocular Movements: Extraocular movements intact.     Conjunctiva/sclera: Conjunctivae normal.  Cardiovascular:     Rate and Rhythm: Normal rate and regular rhythm.     Heart sounds: Normal heart sounds.  Pulmonary:     Effort: Pulmonary effort is normal.     Breath sounds: Normal breath sounds.  Musculoskeletal:        General: Normal range of motion.     Cervical back: Normal range of motion and neck supple.  Skin:    General: Skin is warm and dry.     Comments:  Erythematous, fluctuant abscess right posterior scalp which drains when pressure is applied. Purulent discharge expelled.   Neurological:     Mental Status: She is alert and oriented to person, place, and time.  Psychiatric:        Mood and Affect: Mood normal.        Thought Content: Thought content normal.        Judgment: Judgment normal.     Results for orders placed or performed in visit on 03/11/20  WOUND CULTURE   Specimen: SCALP; Wound   SL  Result Value Ref Range   Gram Stain Result Final report    Organism ID, Bacteria Comment    Organism ID, Bacteria Comment    Aerobic Bacterial Culture Preliminary report (A)    Organism ID, Bacteria Staphylococcus aureus (A)       Assessment & Plan:   Problem List Items Addressed This Visit    None    Visit Diagnoses    Abscess    -  Primary   Gauze and pressure applied to drain some of the purulent material. Warm compresses, complete doxycycline, neosporin BID. F/u if not resolved next week  Relevant Orders   WOUND CULTURE (Completed)      25 minutes spent today in direct care of patient and counseling  Follow up plan: Return if symptoms worsen or fail to improve.

## 2020-03-12 DIAGNOSIS — L0291 Cutaneous abscess, unspecified: Secondary | ICD-10-CM | POA: Diagnosis not present

## 2020-03-16 ENCOUNTER — Encounter (INDEPENDENT_AMBULATORY_CARE_PROVIDER_SITE_OTHER): Payer: Self-pay | Admitting: Vascular Surgery

## 2020-03-16 ENCOUNTER — Ambulatory Visit (INDEPENDENT_AMBULATORY_CARE_PROVIDER_SITE_OTHER): Payer: PPO

## 2020-03-16 ENCOUNTER — Other Ambulatory Visit: Payer: Self-pay

## 2020-03-16 ENCOUNTER — Ambulatory Visit (INDEPENDENT_AMBULATORY_CARE_PROVIDER_SITE_OTHER): Payer: PPO | Admitting: Vascular Surgery

## 2020-03-16 VITALS — BP 125/69 | HR 73 | Ht 67.0 in | Wt 198.0 lb

## 2020-03-16 DIAGNOSIS — I10 Essential (primary) hypertension: Secondary | ICD-10-CM

## 2020-03-16 DIAGNOSIS — I6523 Occlusion and stenosis of bilateral carotid arteries: Secondary | ICD-10-CM

## 2020-03-16 DIAGNOSIS — I4891 Unspecified atrial fibrillation: Secondary | ICD-10-CM

## 2020-03-16 LAB — WOUND CULTURE

## 2020-03-16 NOTE — Progress Notes (Signed)
MRN : [id number]  Mckenzie Gutierrez is a 86 y.o. (December 20, 1921) female who presents with chief complaint of  Chief Complaint  Patient presents with  . Follow-up    60yr carotid U/S follow up  .  History of Present Illness: Patient returns in follow-up of her carotid disease. She is many years status post right carotid endarterectomy. She is doing well today without any focal neurologic symptoms. For her age, she remains quite active and vigorous. Her carotid duplex today reveals a patent right carotid endarterectomy without significant recurrent stenosis and mild carotid stenosis in the 1 to 39% range on the left.  Current Outpatient Medications  Medication Sig Dispense Refill  . amLODipine (NORVASC) 5 MG tablet TAKE 1 TABLET BY MOUTH DAILY 90 tablet 1  . apixaban (ELIQUIS) 2.5 MG TABS tablet Take 2.5 mg by mouth 2 (two) times daily.    . beta carotene w/minerals (OCUVITE) tablet Take 1 tablet by mouth daily.    . Biotin 5000 MCG CAPS Take by mouth daily.    . citalopram (CELEXA) 20 MG tablet Take 1 tablet (20 mg total) by mouth daily. 30 tablet 0  . docusate sodium (COLACE) 100 MG capsule Take 1 capsule (100 mg total) by mouth 2 (two) times daily. 60 capsule 2  . furosemide (LASIX) 20 MG tablet TAKE ONE TABLET EVERY DAY 90 tablet 1  . levothyroxine (SYNTHROID) 75 MCG tablet TAKE 1 TABLET EVERY DAY ON EMPTY STOMACHWITH A GLASS OF WATER AT LEAST 30-60 MINBEFORE BREAKFAST 90 tablet 0  . losartan (COZAAR) 100 MG tablet TAKE ONE TABLET EVERY DAY 90 tablet 1  . Multiple Vitamin (MULTIVITAMIN) tablet Take 1 tablet by mouth daily.    . pantoprazole (PROTONIX) 20 MG tablet Take 20 mg by mouth daily.     . polyethylene glycol (MIRALAX / GLYCOLAX) packet MIX ONE PACKAGE IN LIQUID AND TAKE DAILY 28 each 12  . doxycycline (VIBRA-TABS) 100 MG tablet Take 1 tablet (100 mg total) by mouth 2 (two) times daily. (Patient not taking: Reported on 03/16/2020) 14 tablet 0  . hydrocortisone (ANUSOL-HC) 25 MG  suppository Place 1 suppository (25 mg total) rectally 2 (two) times daily as needed for hemorrhoids or anal itching. (Patient not taking: Reported on 03/16/2020) 12 suppository 2  . psyllium (METAMUCIL) 58.6 % packet Take 1 packet by mouth daily. (Patient not taking: Reported on 03/16/2020)     No current facility-administered medications for this visit.    Past Medical History:  Diagnosis Date  . Anxiety   . Atrial premature beats   . Bradycardia   . Carotid stenosis   . Depression   . GERD (gastroesophageal reflux disease)   . Hyperlipidemia   . Hypertension   . Low back pain   . Osteoporosis   . Thyroid disease   . Urinary frequency     Past Surgical History:  Procedure Laterality Date  . CARPAL TUNNEL RELEASE    . WRIST SURGERY       Social History   Tobacco Use  . Smoking status: Never Smoker  . Smokeless tobacco: Never Used  Vaping Use  . Vaping Use: Never used  Substance Use Topics  . Alcohol use: No    Alcohol/week: 0.0 standard drinks  . Drug use: No       Family History  Problem Relation Age of Onset  . Heart disease Mother   . Stroke Father   . Diabetes Father   . Stroke Sister  Allergies  Allergen Reactions  . Celebrex [Celecoxib] Other (See Comments)    Hard stools  . Codeine Sulfate Other (See Comments)  . Hydrochlorothiazide   . Hytrin [Terazosin]   . Plendil [Felodipine] Diarrhea and Nausea Only  . Nitrofuran Derivatives Swelling and Rash  . Nitrofurantoin Rash and Swelling     REVIEW OF SYSTEMS (Negative unless checked)  Constitutional: [] Weight loss  [] Fever  [] Chills Cardiac: [] Chest pain   [] Chest pressure   [] Palpitations   [] Shortness of breath when laying flat   [] Shortness of breath at rest   [] Shortness of breath with exertion. Vascular:  [] Pain in legs with walking   [] Pain in legs at rest   [] Pain in legs when laying flat   [] Claudication   [] Pain in feet when walking  [] Pain in feet at rest  [] Pain in feet when  laying flat   [] History of DVT   [] Phlebitis   [] Swelling in legs   [] Varicose veins   [] Non-healing ulcers Pulmonary:   [] Uses home oxygen   [] Productive cough   [] Hemoptysis   [] Wheeze  [] COPD   [] Asthma Neurologic:  [x] Dizziness  [] Blackouts   [] Seizures   [] History of stroke   [] History of TIA  [] Aphasia   [] Temporary blindness   [] Dysphagia   [] Weakness or numbness in arms   [] Weakness or numbness in legs Musculoskeletal:  [x] Arthritis   [] Joint swelling   [] Joint pain   [] Low back pain Hematologic:  [] Easy bruising  [] Easy bleeding   [] Hypercoagulable state   [] Anemic  [] Hepatitis Gastrointestinal:  [] Blood in stool   [] Vomiting blood  [] Gastroesophageal reflux/heartburn   [] Difficulty swallowing. Genitourinary:  [] Chronic kidney disease   [] Difficult urination  [] Frequent urination  [] Burning with urination   [] Blood in urine Skin:  [] Rashes   [] Ulcers   [] Wounds Psychological:  [] History of anxiety   [x]  History of major depression.  Physical Examination  Vitals:   03/16/20 1421  BP: 125/69  Pulse: 73  Weight: 198 lb (89.8 kg)  Height: 5\' 7"  (1.702 m)   Body mass index is 31.01 kg/m. Gen:  WD/WN, NAD. Appears far younger than stated age Head: [place]/AT, No temporalis wasting. Ear/Nose/Throat: Hearing grossly intact, nares w/o erythema or drainage, trachea midline Eyes: Conjunctiva clear. Sclera non-icteric Neck: Supple. Trachea midline Pulmonary:  Good air movement, equal and clear to auscultation bilaterally.  Cardiac: Irregular Vascular: Vessel Right Left  Radial Palpable Palpable           Musculoskeletal: M/S 5/5 throughout.  No deformity or atrophy. Trace lower extremity edema. Neurologic: CN 2-12 intact. Sensation grossly intact in extremities.  Symmetrical.  Speech is fluent. Motor exam as listed above. Psychiatric: Judgment intact, Mood & affect appropriate for pt's clinical situation. Dermatologic: No rashes or ulcers noted.  No cellulitis or open wounds. Lymph : No  Cervical, Axillary, or Inguinal lymphadenopathy.     CBC Lab Results  Component Value Date   WBC 7.1 02/15/2020   HGB 12.8 02/15/2020   HCT 38.4 02/15/2020   MCV 101.3 (H) 02/15/2020   PLT 175 02/15/2020    BMET    Component Value Date/Time   NA 140 02/15/2020 1247   NA 145 (H) 02/11/2020 1048   K 3.7 02/15/2020 1247   CL 106 02/15/2020 1247   CO2 26 02/15/2020 1247   GLUCOSE 157 (H) 02/15/2020 1247   BUN 9 02/15/2020 1247   BUN 11 02/11/2020 1048   CREATININE 0.90 02/15/2020 1247   CALCIUM 9.3 02/15/2020 1247  GFRNONAA 54 (L) 02/15/2020 1247   GFRAA >60 02/15/2020 1247   CrCl cannot be calculated (Patient's most recent lab result is older than the maximum 21 days allowed.).  COAG Lab Results  Component Value Date   INR 1.01 03/01/2016    Radiology No results found.   Assessment/Plan Atrial fibrillation (HCC) On anticoagulation  Hypertension blood pressure control important in reducing the progression of atherosclerotic disease. On appropriate oral medications.   Carotid stenosis Her carotid duplex today reveals a patent right carotid endarterectomy without significant recurrent stenosis and mild carotid stenosis in the 1 to 39% range on the left. She is doing well from a vascular point of view. She is on Eliquis for her heart and will continue this which would be more than adequate therapy for her carotid disease. We will continue to follow her on an annual basis.    Leotis Pain, MD  03/16/2020 3:03 PM    This note was created with Dragon medical transcription system.  Any errors from dictation are purely unintentional

## 2020-03-16 NOTE — Assessment & Plan Note (Signed)
On anticoagulation 

## 2020-03-16 NOTE — Assessment & Plan Note (Signed)
Her carotid duplex today reveals a patent right carotid endarterectomy without significant recurrent stenosis and mild carotid stenosis in the 1 to 39% range on the left. She is doing well from a vascular point of view. She is on Eliquis for her heart and will continue this which would be more than adequate therapy for her carotid disease. We will continue to follow her on an annual basis.

## 2020-03-16 NOTE — Assessment & Plan Note (Signed)
blood pressure control important in reducing the progression of atherosclerotic disease. On appropriate oral medications.  

## 2020-04-05 ENCOUNTER — Ambulatory Visit (INDEPENDENT_AMBULATORY_CARE_PROVIDER_SITE_OTHER): Payer: PPO | Admitting: Family Medicine

## 2020-04-05 ENCOUNTER — Encounter: Payer: Self-pay | Admitting: Family Medicine

## 2020-04-06 ENCOUNTER — Other Ambulatory Visit: Payer: Self-pay

## 2020-04-06 ENCOUNTER — Ambulatory Visit (INDEPENDENT_AMBULATORY_CARE_PROVIDER_SITE_OTHER): Payer: PPO | Admitting: Unknown Physician Specialty

## 2020-04-06 ENCOUNTER — Encounter: Payer: Self-pay | Admitting: Unknown Physician Specialty

## 2020-04-06 DIAGNOSIS — I4891 Unspecified atrial fibrillation: Secondary | ICD-10-CM

## 2020-04-06 DIAGNOSIS — F325 Major depressive disorder, single episode, in full remission: Secondary | ICD-10-CM | POA: Diagnosis not present

## 2020-04-06 MED ORDER — CITALOPRAM HYDROBROMIDE 20 MG PO TABS: 20.0000 mg | ORAL_TABLET | Freq: Every day | ORAL | 3 refills | Status: DC

## 2020-04-06 NOTE — Assessment & Plan Note (Signed)
Rate controlled 

## 2020-04-06 NOTE — Assessment & Plan Note (Signed)
This is controlled with Citalopram 20 mg.  Will continue medication.  Refill given for one year

## 2020-04-06 NOTE — Progress Notes (Signed)
BP (!) 155/71 (BP Location: Left Arm, Patient Position: Sitting, Cuff Size: Normal)    Pulse 80    Temp 98 F (36.7 C) (Oral)    Wt 199 lb (90.3 kg)    LMP  (LMP Unknown)    SpO2 96%    BMI 31.17 kg/m    Subjective:    Patient ID: Mckenzie Gutierrez, female    DOB: 04/12/1922, 86 y.o.   MRN: [id number]  HPI: Mckenzie Gutierrez is a 86 y.o. female  Chief Complaint  Patient presents with   Depression   Depression Started Citalopram 20 mg last visit.  She was doing well.    Depression screen [facility] 2/9 04/06/2020 03/05/2020 02/11/2020 08/11/2019 07/31/2018  Decreased Interest 0 1 1 0 0  Down, Depressed, Hopeless 0 1 1 0 0  PHQ - 2 Score 0 2 2 0 0  Altered sleeping 1 1 0 1 0  Tired, decreased energy 0 3 3 1  0  Change in appetite 0 0 0 0 0  Feeling bad or failure about yourself  0 0 0 0 0  Trouble concentrating 0 0 0 0 0  Moving slowly or fidgety/restless 0 0 0 0 0  Suicidal thoughts 0 0 0 0 0  PHQ-9 Score 1 6 5 2  0  Difficult doing work/chores Not difficult at all Not difficult at all Not difficult at all - Not difficult at all  Some recent data might be hidden   Afib Working with cardiology.  Well controlled with stable rate.  No syncope, chest pain, or SOB  Relevant past medical, surgical, family and social history reviewed and updated as indicated. Interim medical history since our last visit reviewed. Allergies and medications reviewed and updated.  Review of Systems  Per HPI unless specifically indicated above     Objective:    BP (!) 155/71 (BP Location: Left Arm, Patient Position: Sitting, Cuff Size: Normal)    Pulse 80    Temp 98 F (36.7 C) (Oral)    Wt 199 lb (90.3 kg)    LMP  (LMP Unknown)    SpO2 96%    BMI 31.17 kg/m   Wt Readings from Last 3 Encounters:  04/06/20 199 lb (90.3 kg)  04/05/20 199 lb (90.3 kg)  03/16/20 198 lb (89.8 kg)    Physical Exam Constitutional:      General: She is not in acute distress.    Appearance: Normal appearance. She is  well-developed.  HENT:     Head: Normocephalic and atraumatic.  Eyes:     General: Lids are normal. No scleral icterus.       Right eye: No discharge.        Left eye: No discharge.     Conjunctiva/sclera: Conjunctivae normal.  Neck:     Vascular: No carotid bruit or JVD.  Cardiovascular:     Rate and Rhythm: Normal rate. Rhythm irregular.     Heart sounds: Murmur heard.   Pulmonary:     Effort: Pulmonary effort is normal.     Breath sounds: Normal breath sounds.  Abdominal:     Palpations: There is no hepatomegaly or splenomegaly.  Musculoskeletal:        General: Normal range of motion.     Cervical back: Normal range of motion and neck supple.  Skin:    General: Skin is warm and dry.     Coloration: Skin is not pale.     Findings: No rash.  Neurological:  Mental Status: She is alert and oriented to person, place, and time.  Psychiatric:        Behavior: Behavior normal.        Thought Content: Thought content normal.        Judgment: Judgment normal.     Results for orders placed or performed in visit on 03/11/20  WOUND CULTURE   Specimen: SCALP; Wound   SL  Result Value Ref Range   Gram Stain Result Final report    Organism ID, Bacteria Comment    Organism ID, Bacteria Comment    Aerobic Bacterial Culture Final report (A)    Organism ID, Bacteria Staphylococcus aureus (A)    Antimicrobial Susceptibility Comment       Assessment & Plan:   Problem List Items Addressed This Visit      Unprioritized   Atrial fibrillation ([place])    Rate controlled.        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  Will continue medication.  Refill given for one year      Relevant Medications   citalopram (CELEXA) 20 MG tablet       Follow up plan: Return if symptoms worsen or fail to improve.

## 2020-04-07 DIAGNOSIS — H6123 Impacted cerumen, bilateral: Secondary | ICD-10-CM | POA: Diagnosis not present

## 2020-04-07 DIAGNOSIS — H903 Sensorineural hearing loss, bilateral: Secondary | ICD-10-CM | POA: Diagnosis not present

## 2020-04-08 NOTE — Progress Notes (Signed)
Called patient to initiate virtual visit (visit converted to virtual due to interrnet outage in clinic facility which created a barrier to in person care and record review) and pt states she prefers an in person visit as soon as possible as she has several things to discuss. Will reschedule for tomorrow and address all concerns then. Pt states they can all wait until this time. Will cancel this appointment.

## 2020-04-26 DIAGNOSIS — H353221 Exudative age-related macular degeneration, left eye, with active choroidal neovascularization: Secondary | ICD-10-CM | POA: Diagnosis not present

## 2020-05-03 ENCOUNTER — Ambulatory Visit: Payer: PPO | Admitting: Gastroenterology

## 2020-05-03 ENCOUNTER — Encounter: Payer: Self-pay | Admitting: Gastroenterology

## 2020-05-03 ENCOUNTER — Other Ambulatory Visit: Payer: Self-pay

## 2020-05-03 VITALS — BP 157/74 | HR 80 | Temp 98.1°F | Ht 67.0 in | Wt 199.0 lb

## 2020-05-03 DIAGNOSIS — K625 Hemorrhage of anus and rectum: Secondary | ICD-10-CM

## 2020-05-03 NOTE — Progress Notes (Signed)
[name] MD, MRCP(U.K) [address]  [place]  [place], [place] [address]  Main: [phone]  Fax: [phone]   Gastroenterology Consultation  Referring Provider:     Volney American,* Primary Care Physician:  Volney American, [place] Primary Gastroenterologist:  Dr. [name]  Reason for Consultation:     Rectal bleeding        HPI:   Mckenzie Gutierrez is a 86 y.o. y/o female referred for consultation & management  by  Volney American, PA-C.    02/15/2020: Hemoglobin 12.8 g She was today accompanied by her daughter.  The patient is very hard of hearing.  She is on Eliquis for atrial fibrillation.  In the month of June she noticed an episode when she saw her a large amount of blood in her toilet bowl.  Was painless.  Did not recover.  She has suffered from hard stools in the past.  She is on a bowel regimen presently including Metamucil, Colace and MiraLAX which seems to be helping her better.  Last episode of any rectal bleeding was a few months back.  Recollect her last colonoscopy was many years back.  No other complaints.  Past Medical History:  Diagnosis Date  . Anxiety   . Atrial premature beats   . Bradycardia   . Carotid stenosis   . Depression   . GERD (gastroesophageal reflux disease)   . Hyperlipidemia   . Hypertension   . Low back pain   . Osteoporosis   . Thyroid disease   . Urinary frequency     Past Surgical History:  Procedure Laterality Date  . CARPAL TUNNEL RELEASE    . WRIST SURGERY      Prior to Admission medications   Medication Sig Start Date End Date Taking? Authorizing Provider  amLODipine (NORVASC) 5 MG tablet TAKE 1 TABLET BY MOUTH DAILY 11/10/19   Volney American, PA-C  apixaban (ELIQUIS) 2.5 MG TABS tablet Take 2.5 mg by mouth 2 (two) times daily. 07/20/16   [provider]  beta carotene w/minerals (OCUVITE) tablet Take 1 tablet by mouth daily.    [provider]  Biotin 5000 MCG CAPS  Take by mouth daily.    [provider]  citalopram (CELEXA) 20 MG tablet Take 1 tablet (20 mg total) by mouth daily. 04/06/20   Kathrine Haddock, NP  docusate sodium (COLACE) 100 MG capsule Take 1 capsule (100 mg total) by mouth 2 (two) times daily. 02/15/20 02/14/21  Blake Divine, MD  furosemide (LASIX) 20 MG tablet TAKE ONE TABLET EVERY DAY 02/06/20   Volney American, PA-C  levothyroxine (SYNTHROID) 75 MCG tablet TAKE 1 TABLET EVERY DAY ON EMPTY STOMACHWITH A GLASS OF WATER AT LEAST 30-60 MINBEFORE BREAKFAST 02/06/20   Volney American, PA-C  losartan (COZAAR) 100 MG tablet TAKE ONE TABLET EVERY DAY 02/06/20   Volney American, PA-C  Multiple Vitamin (MULTIVITAMIN) tablet Take 1 tablet by mouth daily.    [provider]  pantoprazole (PROTONIX) 20 MG tablet Take 20 mg by mouth daily.     [provider]  polyethylene glycol (MIRALAX / GLYCOLAX) packet MIX ONE PACKAGE IN LIQUID AND TAKE DAILY 05/21/17   Kathrine Haddock, NP  psyllium (METAMUCIL) 58.6 % packet Take 1 packet by mouth daily.     [provider]    Family History  Problem Relation Age of Onset  . Heart disease Mother   . Stroke Father   . Diabetes  Father   . Stroke Sister      Social History   Tobacco Use  . Smoking status: Never Smoker  . Smokeless tobacco: Never Used  Vaping Use  . Vaping Use: Never used  Substance Use Topics  . Alcohol use: No    Alcohol/week: 0.0 standard drinks  . Drug use: No    Allergies as of 05/03/2020 - Review Complete 04/06/2020  Allergen Reaction Noted  . Celebrex [celecoxib] Other (See Comments) 01/11/2016  . Codeine sulfate Other (See Comments) 07/12/2015  . Hydrochlorothiazide  07/12/2015  . Hytrin [terazosin]  07/12/2015  . Plendil [felodipine] Diarrhea and Nausea Only 07/12/2015  . Nitrofuran derivatives Swelling and Rash 07/12/2015  . Nitrofurantoin Rash and Swelling 07/12/2015    Review of Systems:    All systems reviewed and  negative except where noted in HPI.   Physical Exam:  LMP  (LMP Unknown)  No LMP recorded (lmp unknown). Patient is postmenopausal. Psych:  Alert and cooperative. Normal mood and affect. General:   Alert,  Well-developed, well-nourished, pleasant and cooperative in NAD Head:  Normocephalic and atraumatic. Eyes:  Sclera clear, no icterus.   Conjunctiva pink. Ears: Very hard of hearing Neurologic:  Alert and oriented x3;  grossly normal neurologically. Psych:  Alert and cooperative. Normal mood and affect.  Imaging Studies: No results found.  Assessment and Plan:   Mckenzie Gutierrez is a 86 y.o. y/o female has been referred for painless rectal bleeding that occurred in July 2021.  No subsequent NSAIDs.  Recent history of constipation which has improved with a bowel regimen.  I explained to her that the differentials included most likely hemorrhoidal bleed.  Obviously based on her age is always a possibility of neoplasm.  Explained the path options were  Plan 1.  Unsedated flexible sigmoidoscopy to evaluate the rectum and possibly the sigmoid colon to rule out any masslike lesion.  Confirm presence of internal hemorrhoids  2.  She has already responded to conservative measures.  We discussed in depth about perianal hygiene, use of sitz bath.  Avoiding constipation.  If her present regimen does not work I can change it to lactulose.  Explained to her that in over 50% of patients conservative measures help hemorrhoidal bleeding adequately.  Obviously at her age need to balance the risks of any procedure versus the benefits.  If conservative management does not work and she has recurrence of her symptoms then could consider banding of hemorrhoids in the office.   I have discussed alternative options, risks & benefits,  which include, but are not limited to, bleeding, infection, perforation,respiratory complication & drug reaction.  The patient agrees with this plan & written consent will be  obtained.     Follow up in 6 weeks telephone visit  Dr [name] MD,MRCP(U.K)

## 2020-05-03 NOTE — Patient Instructions (Signed)
Hemorrhoids Hemorrhoids are swollen veins that may develop:  In the butt (rectum). These are called internal hemorrhoids.  Around the opening of the butt (anus). These are called external hemorrhoids. Hemorrhoids can cause pain, itching, or bleeding. Most of the time, they do not cause serious problems. They usually get better with diet changes, lifestyle changes, and other home treatments. What are the causes? This condition may be caused by:  Having trouble pooping (constipation).  Pushing hard (straining) to poop.  Watery poop (diarrhea).  Pregnancy.  Being very overweight (obese).  Sitting for long periods of time.  Heavy lifting or other activity that causes you to strain.  Anal sex.  Riding a bike for a long period of time. What are the signs or symptoms? Symptoms of this condition include:  Pain.  Itching or soreness in the butt.  Bleeding from the butt.  Leaking poop.  Swelling in the area.  One or more lumps around the opening of your butt. How is this diagnosed? A doctor can often diagnose this condition by looking at the affected area. The doctor may also:  Do an exam that involves feeling the area with a gloved hand (digital rectal exam).  Examine the area inside your butt using a small tube (anoscope).  Order blood tests. This may be done if you have lost a lot of blood.  Have you get a test that involves looking inside the colon using a flexible tube with a camera on the end (sigmoidoscopy or colonoscopy). How is this treated? This condition can usually be treated at home. Your doctor may tell you to change what you eat, make lifestyle changes, or try home treatments. If these do not help, procedures can be done to remove the hemorrhoids or make them smaller. These may involve:  Placing rubber bands at the base of the hemorrhoids to cut off their blood supply.  Injecting medicine into the hemorrhoids to shrink them.  Shining a type of light  energy onto the hemorrhoids to cause them to fall off.  Doing surgery to remove the hemorrhoids or cut off their blood supply. Follow these instructions at home: Eating and drinking   Eat foods that have a lot of fiber in them. These include whole grains, beans, nuts, fruits, and vegetables.  Ask your doctor about taking products that have added fiber (fibersupplements).  Reduce the amount of fat in your diet. You can do this by: ? Eating low-fat dairy products. ? Eating less red meat. ? Avoiding processed foods.  Drink enough fluid to keep your pee (urine) pale yellow. Managing pain and swelling   Take a warm-water bath (sitz bath) for 20 minutes to ease pain. Do this 3-4 times a day. You may do this in a bathtub or using a portable sitz bath that fits over the toilet.  If told, put ice on the painful area. It may be helpful to use ice between your warm baths. ? Put ice in a plastic bag. ? Place a towel between your skin and the bag. ? Leave the ice on for 20 minutes, 2-3 times a day. General instructions  Take over-the-counter and prescription medicines only as told by your doctor. ? Medicated creams and medicines may be used as told.  Exercise often. Ask your doctor how much and what kind of exercise is best for you.  Go to the bathroom when you have the urge to poop. Do not wait.  Avoid pushing too hard when you poop.  Keep your   butt dry and clean. Use wet toilet paper or moist towelettes after pooping.  Do not sit on the toilet for a long time.  Keep all follow-up visits as told by your doctor. This is important. Contact a doctor if you:  Have pain and swelling that do not get better with treatment or medicine.  Have trouble pooping.  Cannot poop.  Have pain or swelling outside the area of the hemorrhoids. Get help right away if you have:  Bleeding that will not stop. Summary  Hemorrhoids are swollen veins in the butt or around the opening of the  butt.  They can cause pain, itching, or bleeding.  Eat foods that have a lot of fiber in them. These include whole grains, beans, nuts, fruits, and vegetables.  Take a warm-water bath (sitz bath) for 20 minutes to ease pain. Do this 3-4 times a day. This information is not intended to replace advice given to you by your health care provider. Make sure you discuss any questions you have with your health care provider. Document Revised: 08/29/2018 Document Reviewed: 01/10/2018 Elsevier Patient Education  2020 [employer]  

## 2020-05-09 ENCOUNTER — Other Ambulatory Visit: Payer: Self-pay | Admitting: Family Medicine

## 2020-05-09 NOTE — Telephone Encounter (Signed)
Requested Prescriptions  Pending Prescriptions Disp Refills  . levothyroxine (SYNTHROID) 75 MCG tablet [Pharmacy Med Name: LEVOTHYROXINE SODIUM 75 MCG TAB] 90 tablet 3    Sig: TAKE 1 TABLET EVERY DAY ON EMPTY STOMACHWITH A GLASS OF WATER AT LEAST 30-60 [place] BREAKFAST     Endocrinology:  Hypothyroid Agents Failed - 05/09/2020 11:20 AM      Failed - TSH needs to be rechecked within 3 months after an abnormal result. Refill until TSH is due.      Passed - TSH in normal range and within 360 days    TSH  Date Value Ref Range Status  02/11/2020 2.590 0.450 - 4.500 uIU/mL Final         Passed - Valid encounter within last 12 months    Recent Outpatient Visits          1 month ago Major depression in complete remission ([place])   [place] Kathrine Haddock, NP   1 month ago Erroneous encounter - disregard   [facility] Volney American, [place]   1 month ago Abscess   [facility] Volney American, [place]   2 months ago [facility], [place], [place]   2 months ago Essential hypertension   [place], [place], [place]      Future Appointments            In 1 month [name], MD [place]   In 3 months [place], [place] T, NP [employer], [facility]   In 9 months  [employer]           . amLODipine (Duplin) 5 MG tablet [employer] Med Name: AMLODIPINE BESYLATE 5 MG TAB] 90 tablet 1    Sig: TAKE 1 TABLET BY MOUTH DAILY     Cardiovascular:  Calcium Channel Blockers Failed - 05/09/2020 11:20 AM      Failed - Last BP in normal range    BP Readings from Last 1 Encounters:  05/03/20 (!) 157/74         Passed - Valid encounter within last 6 months    Recent Outpatient Visits          1 month ago Major depression in complete remission ([place])   [place] Kathrine Haddock, NP   1 month ago Erroneous encounter - disregard   [facility] Volney American, [place]   1 month ago Abscess   [facility] Merrie Roof [place], [place]   2 months ago [facility], [place], [place]   2 months ago Essential hypertension   [facility] Volney American, [place]      Future Appointments            In 1 month [name], MD [place]   In 3 months [place], Barbaraann Faster, NP [employer], [place]   In 9 months  [employer], [place]           . pantoprazole (PROTONIX) 20 MG tablet [Pharmacy Med Name: PANTOPRAZOLE SODIUM 20 MG DR TAB] 90 tablet     Sig: TAKE 1 TABLET BY MOUTH DAILY     Gastroenterology: Proton Pump Inhibitors Passed - 05/09/2020 11:20 AM      Passed - Valid encounter within last 12 months    Recent Outpatient Visits          1 month ago Major  depression in complete remission [facility])   [facility] Kathrine Haddock, NP   1 month ago Erroneous encounter - disregard   [facility] Volney American, [place]   1 month ago Abscess   [facility] Merrie Roof [place], [place]   2 months ago [facility] [place], [place]   2 months ago Essential hypertension   [facility] Volney American, [place]      Future Appointments            In 1 month [name], MD [place]   In 3 months [place], Barbaraann Faster, NP [employer], [facility]   In 9 months  [employer], [place]

## 2020-05-11 ENCOUNTER — Other Ambulatory Visit
Admission: RE | Admit: 2020-05-11 | Discharge: 2020-05-11 | Disposition: A | Discharge status: Home / Self Care | Payer: PPO | Source: Ambulatory Visit | Attending: Gastroenterology | Admitting: Gastroenterology

## 2020-05-11 ENCOUNTER — Other Ambulatory Visit: Payer: Self-pay

## 2020-05-11 DIAGNOSIS — Z01812 Encounter for preprocedural laboratory examination: Secondary | ICD-10-CM | POA: Diagnosis not present

## 2020-05-11 DIAGNOSIS — Z20822 Contact with and (suspected) exposure to covid-19: Secondary | ICD-10-CM | POA: Diagnosis not present

## 2020-05-11 DIAGNOSIS — H353221 Exudative age-related macular degeneration, left eye, with active choroidal neovascularization: Secondary | ICD-10-CM | POA: Diagnosis not present

## 2020-05-12 LAB — SARS CORONAVIRUS 2 (TAT 6-24 HRS): SARS Coronavirus 2: NEGATIVE

## 2020-05-13 ENCOUNTER — Encounter
Admission: RE | Disposition: A | Discharge status: Home / Self Care | Payer: Self-pay | Source: Home / Self Care | Attending: Gastroenterology

## 2020-05-13 ENCOUNTER — Other Ambulatory Visit: Payer: Self-pay

## 2020-05-13 ENCOUNTER — Encounter: Payer: Self-pay | Admitting: Gastroenterology

## 2020-05-13 ENCOUNTER — Ambulatory Visit
Admission: RE | Admit: 2020-05-13 | Discharge: 2020-05-13 | Disposition: A | Discharge status: Home / Self Care | Payer: PPO | Attending: Gastroenterology | Admitting: Gastroenterology

## 2020-05-13 ENCOUNTER — Other Ambulatory Visit: Payer: Self-pay | Admitting: Nurse Practitioner

## 2020-05-13 DIAGNOSIS — Z791 Long term (current) use of non-steroidal anti-inflammatories (NSAID): Secondary | ICD-10-CM | POA: Diagnosis not present

## 2020-05-13 DIAGNOSIS — Z7901 Long term (current) use of anticoagulants: Secondary | ICD-10-CM | POA: Insufficient documentation

## 2020-05-13 DIAGNOSIS — E079 Disorder of thyroid, unspecified: Secondary | ICD-10-CM | POA: Diagnosis not present

## 2020-05-13 DIAGNOSIS — Z888 Allergy status to other drugs, medicaments and biological substances status: Secondary | ICD-10-CM | POA: Insufficient documentation

## 2020-05-13 DIAGNOSIS — Z79899 Other long term (current) drug therapy: Secondary | ICD-10-CM | POA: Insufficient documentation

## 2020-05-13 DIAGNOSIS — K219 Gastro-esophageal reflux disease without esophagitis: Secondary | ICD-10-CM | POA: Insufficient documentation

## 2020-05-13 DIAGNOSIS — I10 Essential (primary) hypertension: Secondary | ICD-10-CM | POA: Diagnosis not present

## 2020-05-13 DIAGNOSIS — Z8249 Family history of ischemic heart disease and other diseases of the circulatory system: Secondary | ICD-10-CM | POA: Diagnosis not present

## 2020-05-13 DIAGNOSIS — Z833 Family history of diabetes mellitus: Secondary | ICD-10-CM | POA: Insufficient documentation

## 2020-05-13 DIAGNOSIS — E785 Hyperlipidemia, unspecified: Secondary | ICD-10-CM | POA: Insufficient documentation

## 2020-05-13 DIAGNOSIS — F419 Anxiety disorder, unspecified: Secondary | ICD-10-CM | POA: Diagnosis not present

## 2020-05-13 DIAGNOSIS — K625 Hemorrhage of anus and rectum: Secondary | ICD-10-CM | POA: Diagnosis not present

## 2020-05-13 DIAGNOSIS — R001 Bradycardia, unspecified: Secondary | ICD-10-CM | POA: Insufficient documentation

## 2020-05-13 DIAGNOSIS — F329 Major depressive disorder, single episode, unspecified: Secondary | ICD-10-CM | POA: Diagnosis not present

## 2020-05-13 DIAGNOSIS — M810 Age-related osteoporosis without current pathological fracture: Secondary | ICD-10-CM | POA: Insufficient documentation

## 2020-05-13 DIAGNOSIS — Z885 Allergy status to narcotic agent status: Secondary | ICD-10-CM | POA: Insufficient documentation

## 2020-05-13 DIAGNOSIS — Z823 Family history of stroke: Secondary | ICD-10-CM | POA: Insufficient documentation

## 2020-05-13 DIAGNOSIS — K641 Second degree hemorrhoids: Secondary | ICD-10-CM | POA: Diagnosis not present

## 2020-05-13 DIAGNOSIS — R350 Frequency of micturition: Secondary | ICD-10-CM | POA: Insufficient documentation

## 2020-05-13 HISTORY — PX: FLEXIBLE SIGMOIDOSCOPY: SHX5431

## 2020-05-13 SURGERY — SIGMOIDOSCOPY, FLEXIBLE

## 2020-05-13 MED ORDER — SODIUM CHLORIDE 0.9 % IV SOLN: INTRAVENOUS | Status: DC

## 2020-05-13 NOTE — Telephone Encounter (Signed)
Requested Prescriptions  Pending Prescriptions Disp Refills  . docusate sodium (COLACE) 100 MG capsule [Pharmacy Med Name: DOCUSATE SODIUM 100 MG CAP] 60 capsule 2    Sig: TAKE 1 CAPSULE BY MOUTH TWICE DAILY     Over the Counter:  OTC Passed - 05/13/2020  3:31 PM      Passed - Valid encounter within last 12 months    Recent Outpatient Visits          1 month ago Major depression in complete remission [facility])   [place] Kathrine Haddock, NP   1 month ago Erroneous encounter - disregard   [facility] Volney American, [place]   2 months ago Abscess   [facility] Volney American, [place]   2 months ago [facility], [place], [place]   3 months ago Essential hypertension   [facility] Volney American, [place]      Future Appointments            In 3 weeks [name], MD [place]   In 3 months [place], Barbaraann Faster, NP [employer], [facility]   In 9 months  [employer], [place]

## 2020-05-13 NOTE — Op Note (Signed)
[facility] Patient Name: Mckenzie Gutierrez Procedure Date: 05/13/2020 9:40 AM MRN: [id number] Account #: [id number] Date of Birth: Sep 19, 1921 Admit Type: Outpatient Age: 86 Room: [facility] ENDO ROOM 1 Gender: Female Note Status: Finalized Procedure:             Flexible Sigmoidoscopy Indications:           Rectal hemorrhage Providers:             [name] MD, MD Referring MD:          Loraine Grip (Referring MD) Medicines:             None Complications:         No immediate complications. Procedure:             Pre-Anesthesia Assessment:                        - Prior to the procedure, a History and Physical was                         performed, and patient medications, allergies and                         sensitivities were reviewed. The patient's tolerance                         of previous anesthesia was reviewed.                        - The risks and benefits of the procedure and the                         sedation options and risks were discussed with the                         patient. All questions were answered and informed                         consent was obtained.                        - ASA Grade Assessment: II - A patient with mild                         systemic disease.                        After obtaining informed consent, the scope was passed                         under direct vision. The was introduced through the                         anus and advanced to the the left transverse colon.                         The flexible sigmoidoscopy was accomplished with ease.                         The patient tolerated the procedure  well. The quality                         of the bowel preparation was excellent. Findings:      Hemorrhoids were found on perianal exam.      Non-bleeding internal hemorrhoids were found during retroflexion, during       perianal exam and during endoscopy. The hemorrhoids were medium-sized        and Grade II (internal hemorrhoids that prolapse but reduce       spontaneously).      The exam was otherwise without abnormality.      Multiple small-mouthed diverticula were found in the left colon. Impression:            - Hemorrhoids found on perianal exam.                        - Non-bleeding internal hemorrhoids.                        - The examination was otherwise normal.                        - No specimens collected. Recommendation:        - Use original regular Metamucil one tablespoon PO BID                         indefinitely.                        - Continue present medications.                        - Discharge patient to home (with escort).                        - Advance diet as tolerated.                        - Return to my office as previously scheduled. Procedure Code(s):     --- Professional ---                        [phone], Sigmoidoscopy, flexible; diagnostic, including                         collection of specimen(s) by brushing or washing, when                         performed (separate procedure) Diagnosis Code(s):     --- Professional ---                        K64.1, Second degree hemorrhoids                        K62.5, Hemorrhage of anus and rectum CPT copyright 2019 American Medical Association. All rights reserved. The codes documented in this report are preliminary and upon coder review may  be revised to meet current compliance requirements. [name], MD [name] MD, MD 05/13/2020 9:59:13 AM This report has been signed electronically. Number of Addenda: 0 Note Initiated On: 05/13/2020 9:40 AM Total Procedure Duration:  0 hours 4 minutes 25 seconds  Estimated Blood Loss:  Estimated blood loss: none.      [facility]

## 2020-05-13 NOTE — H&P (Signed)
Mckenzie Bellows, MD [address] [place], [place], [place], [address] [address], [place], [place], [place], [address] Phone: [phone]  Fax: [phone]  Primary Care Physician:  Volney American, PA-C   Pre-Procedure History & Physical: HPI:  Mckenzie Gutierrez is a 86 y.o. female is here for a sigmoidoscopy    Past Medical History:  Diagnosis Date  . Anxiety   . Atrial premature beats   . Bradycardia   . Carotid stenosis   . Depression   . GERD (gastroesophageal reflux disease)   . Hyperlipidemia   . Hypertension   . Low back pain   . Osteoporosis   . Thyroid disease   . Urinary frequency     Past Surgical History:  Procedure Laterality Date  . CARPAL TUNNEL RELEASE    . WRIST SURGERY      Prior to Admission medications   Medication Sig Start Date End Date Taking? Authorizing Provider  amLODipine (NORVASC) 5 MG tablet TAKE 1 TABLET BY MOUTH DAILY 05/09/20  Yes Volney American, PA-C  apixaban (ELIQUIS) 2.5 MG TABS tablet Take 2.5 mg by mouth 2 (two) times daily. 07/20/16  Yes [provider]  beta carotene w/minerals (OCUVITE) tablet Take 1 tablet by mouth daily.   Yes [provider]  Biotin 5000 MCG CAPS Take by mouth daily.   Yes [provider]  citalopram (CELEXA) 20 MG tablet Take 1 tablet (20 mg total) by mouth daily. 04/06/20  Yes Kathrine Haddock, NP  docusate sodium (COLACE) 100 MG capsule Take 1 capsule (100 mg total) by mouth 2 (two) times daily. 02/15/20 02/14/21 Yes Blake Divine, MD  furosemide (LASIX) 20 MG tablet TAKE ONE TABLET EVERY DAY 02/06/20  Yes Volney American, PA-C  levothyroxine (SYNTHROID) 75 MCG tablet TAKE 1 TABLET EVERY DAY ON EMPTY STOMACHWITH A GLASS OF WATER AT LEAST 30-60 MINBEFORE BREAKFAST 05/09/20  Yes Volney American, PA-C  losartan (COZAAR) 100 MG tablet TAKE ONE TABLET EVERY DAY 02/06/20  Yes Volney American, PA-C  pantoprazole (PROTONIX) 20 MG tablet TAKE 1 TABLET BY  MOUTH DAILY 05/09/20  Yes Volney American, PA-C  Multiple Vitamin (MULTIVITAMIN) tablet Take 1 tablet by mouth daily.    [provider]  polyethylene glycol (MIRALAX / GLYCOLAX) packet MIX ONE PACKAGE IN LIQUID AND TAKE DAILY 05/21/17   Kathrine Haddock, NP  psyllium (METAMUCIL) 58.6 % packet Take 1 packet by mouth daily.     [provider]    Allergies as of 05/04/2020 - Review Complete 05/03/2020  Allergen Reaction Noted  . Celebrex [celecoxib] Other (See Comments) 01/11/2016  . Codeine sulfate Other (See Comments) 07/12/2015  . Hydrochlorothiazide  07/12/2015  . Hytrin [terazosin]  07/12/2015  . Plendil [felodipine] Diarrhea and Nausea Only 07/12/2015  . Nitrofuran derivatives Swelling and Rash 07/12/2015  . Nitrofurantoin Rash and Swelling 07/12/2015    Family History  Problem Relation Age of Onset  . Heart disease Mother   . Stroke Father   . Diabetes Father   . Stroke Sister     Social History   Socioeconomic History  . Marital status: Widowed    Spouse name: Not on file  . Number of children: Not on file  . Years of education: Not on file  . Highest education level: Not on file  Occupational History  . Not on file  Tobacco Use  . Smoking status: Never Smoker  . Smokeless tobacco: Never Used  Vaping Use  .  Vaping Use: Never used  Substance and Sexual Activity  . Alcohol use: No    Alcohol/week: 0.0 standard drinks  . Drug use: No  . Sexual activity: Never  Other Topics Concern  . Not on file  Social History Narrative  . Not on file   Social Determinants of Health   Financial Resource Strain: Low Risk   . Difficulty of Paying Living Expenses: Not hard at all  Food Insecurity: No Food Insecurity  . Worried About Charity fundraiser in the Last Year: Never true  . Ran Out of Food in the Last Year: Never true  Transportation Needs: No Transportation Needs  . Lack of Transportation (Medical): No  . Lack of Transportation (Non-Medical):  No  Physical Activity: Inactive  . Days of Exercise per Week: 0 days  . Minutes of Exercise per Session: 0 min  Stress:   . Feeling of Stress : Not on file  Social Connections: Moderately Isolated  . Frequency of Communication with Friends and Family: More than three times a week  . Frequency of Social Gatherings with Friends and Family: More than three times a week  . Attends Religious Services: More than 4 times per year  . Active Member of Clubs or Organizations: No  . Attends Archivist Meetings: Never  . Marital Status: Widowed  Intimate Partner Violence:   . Fear of Current or Ex-Partner: Not on file  . Emotionally Abused: Not on file  . Physically Abused: Not on file  . Sexually Abused: Not on file    Review of Systems: See HPI, otherwise negative ROS  Physical Exam: LMP  (LMP Unknown)  General:   Alert,  pleasant and cooperative in NAD Head:  Normocephalic and atraumatic. Neck:  Supple; no masses or thyromegaly. Lungs:  Clear throughout to auscultation, normal respiratory effort.    Heart:  +S1, +S2, Regular rate and rhythm, No edema. Abdomen:  Soft, nontender and nondistended. Normal bowel sounds, without guarding, and without rebound.   Neurologic:  Alert and  oriented x4;  grossly normal neurologically.  Impression/Plan: [name] is here for a sigmoidoscopt to be performed for  Rectal bleeding. Risks, benefits, limitations, and alternatives regarding  colonoscopy have been reviewed with the patient.  Questions have been answered.  All parties agreeable.   Mckenzie Bellows, MD  05/13/2020, 9:38 AM

## 2020-05-14 ENCOUNTER — Encounter: Payer: Self-pay | Admitting: Gastroenterology

## 2020-06-02 ENCOUNTER — Ambulatory Visit (INDEPENDENT_AMBULATORY_CARE_PROVIDER_SITE_OTHER): Payer: PPO | Admitting: Gastroenterology

## 2020-06-02 VITALS — BP 138/69 | HR 61 | Temp 97.6°F | Ht 67.0 in | Wt 200.0 lb

## 2020-06-02 DIAGNOSIS — K648 Other hemorrhoids: Secondary | ICD-10-CM

## 2020-06-02 DIAGNOSIS — K625 Hemorrhage of anus and rectum: Secondary | ICD-10-CM

## 2020-06-02 NOTE — Progress Notes (Signed)
[name] MD, MRCP(U.K) [address]  [place]  [place], [place] [address]  Main: [phone]  Fax: [phone]   Primary Care Physician: Volney American, [place]  Primary Gastroenterologist:  Dr. [name]   Chief Complaint  Patient presents with  . Follow-up    Rectal bleeding    HPI: Mckenzie Gutierrez is a 86 y.o. female    Summary of history :  She was initially referred and seen on 05/03/2020 for rectal bleeding.  In June 2021 her hemoglobin was 12.8 g.  She has been on Eliquis for atrial fibrillation.  She is very hard of hearing.  The bleeding was painless and noticed blood in the toilet bowl.  She has suffered from constipation in the past and prior agents being used including Metamucil, Colace and MiraLAX.  Last episode of rectal bleeding was months prior to initial clinic visit.  Due to her age the relative risks of anesthesia it was decided the best option would be to perform an unsedated flexible sigmoidoscopy to evaluate the rectum and sigmoid colon to rule out any masslike lesion.  She was advised on conservative management for her internal hemorrhoids.  Interval history     05/14/2020 flexible sigmoidoscopy: Grade 2 internal hemorrhoids were noted.  No other bleeding from any other site was noted.   Doing well since last visit.  No further rectal bleeding.  Current Outpatient Medications  Medication Sig Dispense Refill  . amLODipine (NORVASC) 5 MG tablet TAKE 1 TABLET BY MOUTH DAILY 90 tablet 1  . apixaban (ELIQUIS) 2.5 MG TABS tablet Take 2.5 mg by mouth 2 (two) times daily.    . beta carotene w/minerals (OCUVITE) tablet Take 1 tablet by mouth daily.    . Biotin 5000 MCG CAPS Take by mouth daily.    . citalopram (CELEXA) 20 MG tablet Take 1 tablet (20 mg total) by mouth daily. 90 tablet 3  . docusate sodium (COLACE) 100 MG capsule TAKE 1 CAPSULE BY MOUTH TWICE DAILY 60 capsule 2  . furosemide (LASIX) 20 MG tablet TAKE ONE TABLET EVERY DAY 90  tablet 1  . levothyroxine (SYNTHROID) 75 MCG tablet TAKE 1 TABLET EVERY DAY ON EMPTY STOMACHWITH A GLASS OF WATER AT LEAST 30-60 MINBEFORE BREAKFAST 90 tablet 3  . losartan (COZAAR) 100 MG tablet TAKE ONE TABLET EVERY DAY 90 tablet 1  . Multiple Vitamin (MULTIVITAMIN) tablet Take 1 tablet by mouth daily.    . pantoprazole (PROTONIX) 20 MG tablet TAKE 1 TABLET BY MOUTH DAILY 90 tablet 1  . polyethylene glycol (MIRALAX / GLYCOLAX) packet MIX ONE PACKAGE IN LIQUID AND TAKE DAILY 28 each 12  . psyllium (METAMUCIL) 58.6 % packet Take 1 packet by mouth daily.      No current facility-administered medications for this visit.    Allergies as of 06/02/2020 - Review Complete 06/02/2020  Allergen Reaction Noted  . Celebrex [celecoxib] Other (See Comments) 01/11/2016  . Codeine sulfate Other (See Comments) 07/12/2015  . Hydrochlorothiazide  07/12/2015  . Hytrin [terazosin]  07/12/2015  . Plendil [felodipine] Diarrhea and Nausea Only 07/12/2015  . Nitrofuran derivatives Swelling and Rash 07/12/2015  . Nitrofurantoin Rash and Swelling 07/12/2015    ROS:  General: Negative for anorexia, weight loss, fever, chills, fatigue, weakness. ENT: Negative for hoarseness, difficulty swallowing , nasal congestion. CV: Negative for chest pain, angina, palpitations, dyspnea on exertion, peripheral edema.  Respiratory: Negative for dyspnea at rest, dyspnea on exertion, cough, sputum, wheezing.  GI: See history of  present illness. GU:  Negative for dysuria, hematuria, urinary incontinence, urinary frequency, nocturnal urination.  Endo: Negative for unusual weight change.    Physical Examination:   BP 138/69   Pulse 61   Temp 97.6 F (36.4 C)   Ht 5\' 7"  (1.702 m)   Wt 200 lb (90.7 kg)   LMP  (LMP Unknown)   BMI 31.32 kg/m   General: Well-nourished, well-developed in no acute distress.  Eyes: No icterus. Conjunctivae pink. Neuro: Alert and oriented x 3.  Grossly intact. Skin: Warm and dry, no  jaundice.   Psych: Alert and cooperative, normal mood and affect.   Imaging Studies: No results found.  Assessment and Plan:   Mckenzie Gutierrez is a 86 y.o. y/o female here to follow-up for rectal bleeding.  Recent colonoscopy demonstrated grade 2 internal hemorrhoids.  Advised on conservative measures.  Hemoglobin on presentation was normal.  No further rectal bleeding.  Further discussed about conservative management of internal hemorrhoids.  I will check a CBC today yet again to ensure hemoglobin is stable.  If stable she does not need to see me unless the issue recurs    Dr [name]  MD,MRCP [facility]) Follow up in as needed

## 2020-06-03 LAB — CBC
Hematocrit: 36.5 % (ref 34.0–46.6)
Hemoglobin: 12.6 g/dL (ref 11.1–15.9)
MCH: 33.7 pg — ABNORMAL HIGH (ref 26.6–33.0)
MCHC: 34.5 g/dL (ref 31.5–35.7)
MCV: 98 fL — ABNORMAL HIGH (ref 79–97)
NRBC: 1 % — ABNORMAL HIGH (ref 0–0)
Platelets: 169 10*3/uL (ref 150–450)
RBC: 3.74 x10E6/uL — ABNORMAL LOW (ref 3.77–5.28)
RDW: 14.5 % (ref 11.7–15.4)
WBC: 6.9 10*3/uL (ref 3.4–10.8)

## 2020-06-03 NOTE — Progress Notes (Signed)
Cbc stable and normal

## 2020-06-04 ENCOUNTER — Telehealth: Payer: Self-pay

## 2020-06-04 NOTE — Telephone Encounter (Signed)
-----   Message from [name], MD sent at 06/03/2020  8:22 AM EDT ----- Cbc stable and normal

## 2020-06-08 ENCOUNTER — Ambulatory Visit: Payer: PPO | Admitting: Gastroenterology

## 2020-06-15 DIAGNOSIS — H353221 Exudative age-related macular degeneration, left eye, with active choroidal neovascularization: Secondary | ICD-10-CM | POA: Diagnosis not present

## 2020-06-28 ENCOUNTER — Encounter: Payer: Self-pay | Admitting: Nurse Practitioner

## 2020-06-28 ENCOUNTER — Other Ambulatory Visit: Payer: Self-pay | Admitting: Nurse Practitioner

## 2020-06-28 DIAGNOSIS — R739 Hyperglycemia, unspecified: Secondary | ICD-10-CM

## 2020-06-28 NOTE — Progress Notes (Signed)
hgb

## 2020-07-01 DIAGNOSIS — I739 Peripheral vascular disease, unspecified: Secondary | ICD-10-CM | POA: Diagnosis not present

## 2020-07-01 DIAGNOSIS — I6523 Occlusion and stenosis of bilateral carotid arteries: Secondary | ICD-10-CM | POA: Diagnosis not present

## 2020-07-01 DIAGNOSIS — E782 Mixed hyperlipidemia: Secondary | ICD-10-CM | POA: Diagnosis not present

## 2020-07-01 DIAGNOSIS — E079 Disorder of thyroid, unspecified: Secondary | ICD-10-CM | POA: Diagnosis not present

## 2020-07-01 DIAGNOSIS — R011 Cardiac murmur, unspecified: Secondary | ICD-10-CM | POA: Diagnosis not present

## 2020-07-01 DIAGNOSIS — R531 Weakness: Secondary | ICD-10-CM | POA: Diagnosis not present

## 2020-07-01 DIAGNOSIS — I350 Nonrheumatic aortic (valve) stenosis: Secondary | ICD-10-CM | POA: Diagnosis not present

## 2020-07-01 DIAGNOSIS — R0600 Dyspnea, unspecified: Secondary | ICD-10-CM | POA: Diagnosis not present

## 2020-07-01 DIAGNOSIS — I480 Paroxysmal atrial fibrillation: Secondary | ICD-10-CM | POA: Diagnosis not present

## 2020-07-01 DIAGNOSIS — E669 Obesity, unspecified: Secondary | ICD-10-CM | POA: Diagnosis not present

## 2020-07-01 DIAGNOSIS — I10 Essential (primary) hypertension: Secondary | ICD-10-CM | POA: Diagnosis not present

## 2020-07-01 DIAGNOSIS — Z8673 Personal history of transient ischemic attack (TIA), and cerebral infarction without residual deficits: Secondary | ICD-10-CM | POA: Diagnosis not present

## 2020-07-07 ENCOUNTER — Other Ambulatory Visit: Payer: Self-pay

## 2020-07-07 ENCOUNTER — Other Ambulatory Visit: Payer: PPO

## 2020-07-07 DIAGNOSIS — R739 Hyperglycemia, unspecified: Secondary | ICD-10-CM | POA: Diagnosis not present

## 2020-07-08 LAB — HEMOGLOBIN A1C
Est. average glucose Bld gHb Est-mCnc: 126 mg/dL
Hgb A1c MFr Bld: 6 % — ABNORMAL HIGH (ref 4.8–5.6)

## 2020-07-10 ENCOUNTER — Other Ambulatory Visit: Payer: Self-pay | Admitting: Nurse Practitioner

## 2020-07-10 NOTE — Telephone Encounter (Signed)
Requested Prescriptions  Pending Prescriptions Disp Refills   docusate sodium (COLACE) 100 MG capsule [Pharmacy Med Name: DOCUSATE SODIUM 100 MG CAP] 60 capsule 2    Sig: TAKE 1 CAPSULE BY MOUTH TWICE DAILY     Over the Counter:  OTC Passed - 07/10/2020 11:26 AM      Passed - Valid encounter within last 12 months    Recent Outpatient Visits          3 months ago Major depression in complete remission [facility])   [place] Kathrine Haddock, NP   3 months ago Erroneous encounter - disregard   [facility] Volney American, [place]   4 months ago Abscess   [facility] Merrie Roof [place], [place]   4 months ago [facility], [place], [place]   5 months ago Essential hypertension   [place], Lilia Argue, [place]      Future Appointments            In 1 month [place], Barbaraann Faster, NP [employer], [place]   In 7 months  [employer], [place]

## 2020-08-07 ENCOUNTER — Other Ambulatory Visit: Payer: Self-pay | Admitting: Nurse Practitioner

## 2020-08-08 ENCOUNTER — Encounter: Payer: Self-pay | Admitting: Nurse Practitioner

## 2020-08-08 DIAGNOSIS — H353221 Exudative age-related macular degeneration, left eye, with active choroidal neovascularization: Secondary | ICD-10-CM | POA: Insufficient documentation

## 2020-08-09 DIAGNOSIS — H353221 Exudative age-related macular degeneration, left eye, with active choroidal neovascularization: Secondary | ICD-10-CM | POA: Diagnosis not present

## 2020-08-09 DIAGNOSIS — H353211 Exudative age-related macular degeneration, right eye, with active choroidal neovascularization: Secondary | ICD-10-CM | POA: Diagnosis not present

## 2020-08-12 ENCOUNTER — Ambulatory Visit (INDEPENDENT_AMBULATORY_CARE_PROVIDER_SITE_OTHER): Payer: PPO | Admitting: Nurse Practitioner

## 2020-08-12 ENCOUNTER — Encounter: Payer: Self-pay | Admitting: Nurse Practitioner

## 2020-08-12 ENCOUNTER — Other Ambulatory Visit: Payer: Self-pay

## 2020-08-12 VITALS — BP 128/68 | HR 81 | Temp 97.8°F | Ht 65.91 in | Wt 197.8 lb

## 2020-08-12 DIAGNOSIS — E039 Hypothyroidism, unspecified: Secondary | ICD-10-CM

## 2020-08-12 DIAGNOSIS — F325 Major depressive disorder, single episode, in full remission: Secondary | ICD-10-CM | POA: Diagnosis not present

## 2020-08-12 DIAGNOSIS — D6869 Other thrombophilia: Secondary | ICD-10-CM | POA: Diagnosis not present

## 2020-08-12 DIAGNOSIS — N3941 Urge incontinence: Secondary | ICD-10-CM | POA: Diagnosis not present

## 2020-08-12 DIAGNOSIS — I480 Paroxysmal atrial fibrillation: Secondary | ICD-10-CM

## 2020-08-12 DIAGNOSIS — R0602 Shortness of breath: Secondary | ICD-10-CM | POA: Diagnosis not present

## 2020-08-12 DIAGNOSIS — R5381 Other malaise: Secondary | ICD-10-CM

## 2020-08-12 DIAGNOSIS — I10 Essential (primary) hypertension: Secondary | ICD-10-CM | POA: Diagnosis not present

## 2020-08-12 MED ORDER — DOCUSATE SODIUM 100 MG PO CAPS
100.0000 mg | ORAL_CAPSULE | Freq: Two times a day (BID) | ORAL | 2 refills | Status: DC
Start: 2020-08-12 — End: 2020-09-06

## 2020-08-12 MED ORDER — ALBUTEROL SULFATE HFA 108 (90 BASE) MCG/ACT IN AERS: 2.0000 | INHALATION_SPRAY | Freq: Four times a day (QID) | RESPIRATORY_TRACT | 0 refills | Status: DC | PRN

## 2020-08-12 NOTE — Assessment & Plan Note (Signed)
With advanced age, daughter reports patient having more difficulty with stairs.  Would benefit from home PT to assist in strengthening, assess, and work on fall prevention.  Patient initially hesitant about this, but after discussion with provider and daughter agrees to referral.

## 2020-08-12 NOTE — Assessment & Plan Note (Signed)
Taking Eliquis for atrial fibrillation.  Continue current medication regimen and monitor CBC annually.

## 2020-08-12 NOTE — Assessment & Plan Note (Addendum)
Chronic, rate controlled.  Continue collaboration with cardiology, due to see next in February 2022.  Continue current medication regimen and adjust as needed.  Would continue Lasix, although has more urinary urgency with this, but concern for fluid overload without this on board.  Recommend she monitor BP at home at least weekly and document for visits.  BMP, TSH today.  Return in 6 months to meet new PCP.

## 2020-08-12 NOTE — Progress Notes (Signed)
BP 128/68   Pulse 81   Temp 97.8 F (36.6 C) (Oral)   Ht 5' 5.91" (1.674 m)   Wt 197 lb 12.8 oz (89.7 kg)   LMP  (LMP Unknown)   SpO2 95%   BMI 32.02 kg/m    Subjective:    Patient ID: [name], female    DOB: 07-03-1922, 86 y.o.   MRN: [id number]  HPI: SARENA JEZEK is a 86 y.o. female  Chief Complaint  Patient presents with  . Follow-up    Following up on A Fib and depression. Patient daughter presents with her today and she states mother is having more S.O.B   Her daughter is present at bedside to assist with HPI.  ATRIAL FIBRILLATION Continues on Losartan, Lasix, Amlodipine, and Apixaban.  Followed by cardiology and last seen 07/01/20.  Recent EF echo in February was >55%.  She is to follow-up with them next in February 2022, they have been monitoring her due to increased SOB.  She reports this is ongoing issue, noted more with activity.    Her daughter is at bedside and also reports patient has more unsteady gait recently, especially with walking up and down stairs.  No recent falls or fractures.  Discussed at length with her daughter and her benefit of doing a period of PT in home to help with strengthening and fall prevention -- her daughter is a retired Marine scientist and agrees this would offer benefit. Atrial fibrillation status: stable Satisfied with current treatment: yes  Medication side effects:  no Medication compliance: good compliance Etiology of atrial fibrillation:  unknown Palpitations:  no Chest pain:  no Dyspnea on exertion:  yes, being followed by cardiology Orthopnea:  no -- sleeps on one pillow and this is baseline for her Syncope:  no Edema:  no Ventricular rate control: none Anti-coagulation: long acting   HYPOTHYROIDISM Continues Levothyroxine 75 MCG daily. Last TSH in June 2.590. Thyroid control status:stable Satisfied with current treatment? yes Medication side effects: no Medication compliance: good compliance Etiology of  hypothyroidism:  Recent dose adjustment:no Fatigue: no Cold intolerance: no Heat intolerance: no Weight gain: no Weight loss: no Constipation: no Diarrhea/loose stools: no Palpitations: no Lower extremity edema: no Anxiety/depressed mood: no  DEPRESSION Continues on Celexa 20 MG daily.   Mood status: stable Satisfied with current treatment?: yes Symptom severity: mild  Duration of current treatment : chronic Side effects: no Medication compliance: good compliance Psychotherapy/counseling: none Depressed mood: no Anxious mood: no Anhedonia: no Significant weight loss or gain: no Insomnia: yes hard to stay asleep Fatigue: no Feelings of worthlessness or guilt: no Impaired concentration/indecisiveness: no Suicidal ideations: no Hopelessness: no Crying spells: no Depression screen [facility] 2/9 08/12/2020 04/06/2020 03/05/2020 02/11/2020 08/11/2019  Decreased Interest 1 0 1 1 0  Down, Depressed, Hopeless 1 0 1 1 0  PHQ - 2 Score 2 0 2 2 0  Altered sleeping 0 1 1 0 1  Tired, decreased energy 0 0 3 3 1   Change in appetite 0 0 0 0 0  Feeling bad or failure about yourself  1 0 0 0 0  Trouble concentrating 0 0 0 0 0  Moving slowly or fidgety/restless 0 0 0 0 0  Suicidal thoughts 0 0 0 0 0  PHQ-9 Score 3 1 6 5 2   Difficult doing work/chores Somewhat difficult Not difficult at all Not difficult at all Not difficult at all -  Some recent data might be hidden   NOCTURIA: Patient complains  of urinary urgency at night. This has been present for several months. She notices this issue mainly at night, is on Lasix during daytime hours. Patient describes the symptoms as nocturia 3 times per night. Factors associated with symptoms include increased fluid intake at night and use of Lasix. Evaluation to date includes none. Treatment to date includes none.  Discussed at length with patient.  Her daughter reports that she can bring her mother a commode which may benefit patient, so she does not need to  walk distance to bathroom.  Patient lives on own still and concern for falls with aging.     Relevant past medical, surgical, family and social history reviewed and updated as indicated. Interim medical history since our last visit reviewed. Allergies and medications reviewed and updated.  Review of Systems  Constitutional: Negative for activity change, appetite change, diaphoresis, fatigue and fever.  Respiratory: Positive for shortness of breath. Negative for cough, chest tightness and wheezing.   Cardiovascular: Negative for chest pain, palpitations and leg swelling.  Genitourinary: Negative for decreased urine volume, dysuria, frequency and urgency.  Neurological: Negative.   Psychiatric/Behavioral: Negative.     Per HPI unless specifically indicated above     Objective:    BP 128/68   Pulse 81   Temp 97.8 F (36.6 C) (Oral)   Ht 5' 5.91" (1.674 m)   Wt 197 lb 12.8 oz (89.7 kg)   LMP  (LMP Unknown)   SpO2 95%   BMI 32.02 kg/m   Wt Readings from Last 3 Encounters:  08/12/20 197 lb 12.8 oz (89.7 kg)  06/02/20 200 lb (90.7 kg)  05/13/20 199 lb (90.3 kg)    Physical Exam Vitals and nursing note reviewed.  Constitutional:      General: She is awake. She is not in acute distress.    Appearance: She is well-developed and well-groomed. She is obese. She is not ill-appearing.  HENT:     Head: Normocephalic.     Right Ear: Hearing normal.     Left Ear: Hearing normal.  Eyes:     General: Lids are normal.        Right eye: No discharge.        Left eye: No discharge.     Conjunctiva/sclera: Conjunctivae normal.     Pupils: Pupils are equal, round, and reactive to light.  Neck:     Thyroid: No thyromegaly.     Vascular: No carotid bruit.  Cardiovascular:     Rate and Rhythm: Normal rate and regular rhythm.     Heart sounds: Normal heart sounds. No murmur heard. No gallop.   Pulmonary:     Effort: Pulmonary effort is normal. No accessory muscle usage or respiratory  distress.     Breath sounds: Normal breath sounds.  Abdominal:     General: Bowel sounds are normal.     Palpations: Abdomen is soft.  Musculoskeletal:     Cervical back: Normal range of motion and neck supple.     Right lower leg: No edema.     Left lower leg: No edema.  Skin:    General: Skin is warm and dry.  Neurological:     Mental Status: She is alert and oriented to person, place, and time.  Psychiatric:        Attention and Perception: Attention normal.        Mood and Affect: Mood normal.        Speech: Speech normal.  Behavior: Behavior normal. Behavior is cooperative.        Thought Content: Thought content normal.     Results for orders placed or performed in visit on 07/07/20  HgB A1c  Result Value Ref Range   Hgb A1c MFr Bld 6.0 (H) 4.8 - 5.6 %   Est. average glucose Bld gHb Est-mCnc 126 mg/dL      Assessment & Plan:   Problem List Items Addressed This Visit      Cardiovascular and Mediastinum   Hypertension    Chronic, stable with BP below goal for advanced age today.  Goal <140/90, avoid hypotension to help with fall prevention.  Continue current medication regimen and collaboration with cardiology.  BMP and TSH today.  Return in 6 months.      Atrial fibrillation (HCC) - Primary    Chronic, rate controlled.  Continue collaboration with cardiology, due to see next in February 2022.  Continue current medication regimen and adjust as needed.  Would continue Lasix, although has more urinary urgency with this, but concern for fluid overload without this on board.  Recommend she monitor BP at home at least weekly and document for visits.  BMP, TSH today.  Return in 6 months to meet new PCP.      Relevant Orders   Basic metabolic panel   TSH   B Nat Peptide     Endocrine   Hypothyroid    Chronic, ongoing.  Continue current medication regimen and adjust as needed.  TSH today.        Hematopoietic and Hemostatic   Acquired thrombophilia ([place])     Taking Eliquis for atrial fibrillation.  Continue current medication regimen and monitor CBC annually.        Other   Major depression in complete remission (HCC)    Chronic, stable.  Denies SI/HI.  Continue current medication regimen and adjust as needed.  Refills performed in August.  Return in 6 months to meet new PCP.      SOB (shortness of breath) on exertion    Ongoing issue, followed by cardiology.  Obtain labs today BNP, TSH, BMP.  Will trial Albuterol inhaler as needed for SOB with exertion, although suspect this is more related to underlying cardiac health.      Urge incontinence of urine    Chronic, ongoing issue with nocturia.  Is on Lasix which is most likely exacerbating this at times.  Recommend she decrease fluid intake after 5 pm at night + work on Kegel exercises at home.  Would benefit from commode at bedside at home, which daughter plans to provider to her, this would assist with fall prevention.  Discussed medications that can be used to assist with issue, but recommended against these at this time due to anticholinergic effect and her advanced age.        Physical deconditioning    With advanced age, daughter reports patient having more difficulty with stairs.  Would benefit from home PT to assist in strengthening, assess, and work on fall prevention.  Patient initially hesitant about this, but after discussion with provider and daughter agrees to referral.      Relevant Orders   Ambulatory referral to [place]       Follow up plan: Return in about 6 months (around 02/10/2021) for Chronic disease visit and meet new PCP.

## 2020-08-12 NOTE — Patient Instructions (Signed)

## 2020-08-12 NOTE — Assessment & Plan Note (Signed)
Chronic, ongoing.  Continue current medication regimen and adjust as needed.  TSH today. ?

## 2020-08-12 NOTE — Assessment & Plan Note (Signed)
Ongoing issue, followed by cardiology.  Obtain labs today BNP, TSH, BMP.  Will trial Albuterol inhaler as needed for SOB with exertion, although suspect this is more related to underlying cardiac health.

## 2020-08-12 NOTE — Assessment & Plan Note (Signed)
Chronic, stable.  Denies SI/HI.  Continue current medication regimen and adjust as needed.  Refills performed in August.  Return in 6 months to meet new PCP.

## 2020-08-12 NOTE — Assessment & Plan Note (Signed)
Chronic, ongoing issue with nocturia.  Is on Lasix which is most likely exacerbating this at times.  Recommend she decrease fluid intake after 5 pm at night + work on Kegel exercises at home.  Would benefit from commode at bedside at home, which daughter plans to provider to her, this would assist with fall prevention.  Discussed medications that can be used to assist with issue, but recommended against these at this time due to anticholinergic effect and her advanced age.

## 2020-08-12 NOTE — Assessment & Plan Note (Signed)
Chronic, stable with BP below goal for advanced age today.  Goal <140/90, avoid hypotension to help with fall prevention.  Continue current medication regimen and collaboration with cardiology.  BMP and TSH today.  Return in 6 months.

## 2020-08-13 LAB — BASIC METABOLIC PANEL
BUN/Creatinine Ratio: 12 (ref 12–28)
BUN: 9 mg/dL — ABNORMAL LOW (ref 10–36)
CO2: 22 mmol/L (ref 20–29)
Calcium: 8.8 mg/dL (ref 8.7–10.3)
Chloride: 104 mmol/L (ref 96–106)
Creatinine, Ser: 0.76 mg/dL (ref 0.57–1.00)
GFR calc Af Amer: 77 mL/min/{1.73_m2} (ref >59)
GFR calc non Af Amer: 66 mL/min/{1.73_m2} (ref >59)
Glucose: 109 mg/dL — ABNORMAL HIGH (ref 65–99)
Potassium: 4 mmol/L (ref 3.5–5.2)
Sodium: 142 mmol/L (ref 134–144)

## 2020-08-13 LAB — TSH: TSH: 2.76 u[IU]/mL (ref 0.450–4.500)

## 2020-08-13 LAB — BRAIN NATRIURETIC PEPTIDE: BNP: 111.3 pg/mL — ABNORMAL HIGH (ref 0.0–100.0)

## 2020-08-13 NOTE — Progress Notes (Signed)
Contacted via [place] evening Mckenzie Gutierrez, your labs have returned.  Kidney function continues to be stable.  Thyroid function is normal.  Your BNP, which looks at your heart, was mildly elevated.  I would recommend taking an extra 20 MG Lasix daily for the next few days and then return to taking only 20 MG (one pill) daily.  I also want you to follow up with your cardiologist before February due to this increase shortness of breath you are having, to ensure heart is pumping well.  I also recommend: - Call for an overnight weight gain of >2 pounds or a weekly weight gain of >5 pounds - No adding salt to food and read food labels. Keep daily sodium intake to 2000mg  daily  Keep being awesome!!  Thank you for allowing me to participate in your care. Kindest regards, Corrigan Kretschmer

## 2020-08-17 DIAGNOSIS — D6869 Other thrombophilia: Secondary | ICD-10-CM | POA: Diagnosis not present

## 2020-08-17 DIAGNOSIS — F325 Major depressive disorder, single episode, in full remission: Secondary | ICD-10-CM | POA: Diagnosis not present

## 2020-08-17 DIAGNOSIS — Z7901 Long term (current) use of anticoagulants: Secondary | ICD-10-CM | POA: Diagnosis not present

## 2020-08-17 DIAGNOSIS — I6529 Occlusion and stenosis of unspecified carotid artery: Secondary | ICD-10-CM | POA: Diagnosis not present

## 2020-08-17 DIAGNOSIS — E785 Hyperlipidemia, unspecified: Secondary | ICD-10-CM | POA: Diagnosis not present

## 2020-08-17 DIAGNOSIS — E039 Hypothyroidism, unspecified: Secondary | ICD-10-CM | POA: Diagnosis not present

## 2020-08-17 DIAGNOSIS — M810 Age-related osteoporosis without current pathological fracture: Secondary | ICD-10-CM | POA: Diagnosis not present

## 2020-08-17 DIAGNOSIS — R351 Nocturia: Secondary | ICD-10-CM | POA: Diagnosis not present

## 2020-08-17 DIAGNOSIS — M48061 Spinal stenosis, lumbar region without neurogenic claudication: Secondary | ICD-10-CM | POA: Diagnosis not present

## 2020-08-17 DIAGNOSIS — I10 Essential (primary) hypertension: Secondary | ICD-10-CM | POA: Diagnosis not present

## 2020-08-17 DIAGNOSIS — I480 Paroxysmal atrial fibrillation: Secondary | ICD-10-CM | POA: Diagnosis not present

## 2020-08-17 DIAGNOSIS — Z9181 History of falling: Secondary | ICD-10-CM | POA: Diagnosis not present

## 2020-08-17 DIAGNOSIS — K219 Gastro-esophageal reflux disease without esophagitis: Secondary | ICD-10-CM | POA: Diagnosis not present

## 2020-08-17 DIAGNOSIS — L639 Alopecia areata, unspecified: Secondary | ICD-10-CM | POA: Diagnosis not present

## 2020-08-17 DIAGNOSIS — M47816 Spondylosis without myelopathy or radiculopathy, lumbar region: Secondary | ICD-10-CM | POA: Diagnosis not present

## 2020-08-17 DIAGNOSIS — R600 Localized edema: Secondary | ICD-10-CM | POA: Diagnosis not present

## 2020-08-17 DIAGNOSIS — H353221 Exudative age-related macular degeneration, left eye, with active choroidal neovascularization: Secondary | ICD-10-CM | POA: Diagnosis not present

## 2020-08-20 ENCOUNTER — Telehealth: Payer: Self-pay

## 2020-08-20 NOTE — Telephone Encounter (Signed)
Called and left a message letting patient's son know that he can contact [facility] and they will be able to let him know when to expect PT.

## 2020-08-20 NOTE — Telephone Encounter (Signed)
Copied from [place] [id number]. Topic: Referral - Status >> Aug 20, 2020  1:00 PM Yvette Rack wrote: Reason for CRM: Pt son Berta Denson stated the home health nurse came out and they were told the therapist would be out on yesterday but they have not heard from anyone. Pt son Herbie [place] requests call back. Cb# [phone]

## 2020-08-30 DIAGNOSIS — Z9181 History of falling: Secondary | ICD-10-CM | POA: Diagnosis not present

## 2020-08-30 DIAGNOSIS — Z7901 Long term (current) use of anticoagulants: Secondary | ICD-10-CM | POA: Diagnosis not present

## 2020-08-30 DIAGNOSIS — M810 Age-related osteoporosis without current pathological fracture: Secondary | ICD-10-CM | POA: Diagnosis not present

## 2020-08-30 DIAGNOSIS — K219 Gastro-esophageal reflux disease without esophagitis: Secondary | ICD-10-CM | POA: Diagnosis not present

## 2020-08-30 DIAGNOSIS — M48061 Spinal stenosis, lumbar region without neurogenic claudication: Secondary | ICD-10-CM | POA: Diagnosis not present

## 2020-08-30 DIAGNOSIS — M47816 Spondylosis without myelopathy or radiculopathy, lumbar region: Secondary | ICD-10-CM | POA: Diagnosis not present

## 2020-08-30 DIAGNOSIS — I480 Paroxysmal atrial fibrillation: Secondary | ICD-10-CM | POA: Diagnosis not present

## 2020-08-30 DIAGNOSIS — D6869 Other thrombophilia: Secondary | ICD-10-CM | POA: Diagnosis not present

## 2020-08-30 DIAGNOSIS — H353221 Exudative age-related macular degeneration, left eye, with active choroidal neovascularization: Secondary | ICD-10-CM | POA: Diagnosis not present

## 2020-08-30 DIAGNOSIS — F325 Major depressive disorder, single episode, in full remission: Secondary | ICD-10-CM | POA: Diagnosis not present

## 2020-08-30 DIAGNOSIS — I6529 Occlusion and stenosis of unspecified carotid artery: Secondary | ICD-10-CM | POA: Diagnosis not present

## 2020-08-30 DIAGNOSIS — L639 Alopecia areata, unspecified: Secondary | ICD-10-CM | POA: Diagnosis not present

## 2020-08-30 DIAGNOSIS — E785 Hyperlipidemia, unspecified: Secondary | ICD-10-CM | POA: Diagnosis not present

## 2020-08-30 DIAGNOSIS — I10 Essential (primary) hypertension: Secondary | ICD-10-CM | POA: Diagnosis not present

## 2020-08-30 DIAGNOSIS — R351 Nocturia: Secondary | ICD-10-CM | POA: Diagnosis not present

## 2020-08-30 DIAGNOSIS — R600 Localized edema: Secondary | ICD-10-CM | POA: Diagnosis not present

## 2020-08-30 DIAGNOSIS — E039 Hypothyroidism, unspecified: Secondary | ICD-10-CM | POA: Diagnosis not present

## 2020-09-06 ENCOUNTER — Other Ambulatory Visit: Payer: Self-pay | Admitting: Nurse Practitioner

## 2020-09-06 DIAGNOSIS — E039 Hypothyroidism, unspecified: Secondary | ICD-10-CM | POA: Diagnosis not present

## 2020-09-06 DIAGNOSIS — M47816 Spondylosis without myelopathy or radiculopathy, lumbar region: Secondary | ICD-10-CM | POA: Diagnosis not present

## 2020-09-06 DIAGNOSIS — Z7901 Long term (current) use of anticoagulants: Secondary | ICD-10-CM | POA: Diagnosis not present

## 2020-09-06 DIAGNOSIS — R351 Nocturia: Secondary | ICD-10-CM | POA: Diagnosis not present

## 2020-09-06 DIAGNOSIS — D6869 Other thrombophilia: Secondary | ICD-10-CM | POA: Diagnosis not present

## 2020-09-06 DIAGNOSIS — M810 Age-related osteoporosis without current pathological fracture: Secondary | ICD-10-CM | POA: Diagnosis not present

## 2020-09-06 DIAGNOSIS — I10 Essential (primary) hypertension: Secondary | ICD-10-CM | POA: Diagnosis not present

## 2020-09-06 DIAGNOSIS — R600 Localized edema: Secondary | ICD-10-CM | POA: Diagnosis not present

## 2020-09-06 DIAGNOSIS — K219 Gastro-esophageal reflux disease without esophagitis: Secondary | ICD-10-CM | POA: Diagnosis not present

## 2020-09-06 DIAGNOSIS — H353221 Exudative age-related macular degeneration, left eye, with active choroidal neovascularization: Secondary | ICD-10-CM | POA: Diagnosis not present

## 2020-09-06 DIAGNOSIS — I6529 Occlusion and stenosis of unspecified carotid artery: Secondary | ICD-10-CM | POA: Diagnosis not present

## 2020-09-06 DIAGNOSIS — Z9181 History of falling: Secondary | ICD-10-CM | POA: Diagnosis not present

## 2020-09-06 DIAGNOSIS — E785 Hyperlipidemia, unspecified: Secondary | ICD-10-CM | POA: Diagnosis not present

## 2020-09-06 DIAGNOSIS — F325 Major depressive disorder, single episode, in full remission: Secondary | ICD-10-CM | POA: Diagnosis not present

## 2020-09-06 DIAGNOSIS — L639 Alopecia areata, unspecified: Secondary | ICD-10-CM | POA: Diagnosis not present

## 2020-09-06 DIAGNOSIS — M48061 Spinal stenosis, lumbar region without neurogenic claudication: Secondary | ICD-10-CM | POA: Diagnosis not present

## 2020-09-06 DIAGNOSIS — I480 Paroxysmal atrial fibrillation: Secondary | ICD-10-CM | POA: Diagnosis not present

## 2020-09-06 NOTE — Telephone Encounter (Signed)
Approved per protocol. Requested Prescriptions  Pending Prescriptions Disp Refills  . docusate sodium (COLACE) 100 MG capsule [Pharmacy Med Name: DOCUSATE SODIUM 100 MG CAP] 60 capsule 1    Sig: TAKE 1 CAPSULE BY MOUTH TWICE DAILY     Over the Counter:  OTC Passed - 09/06/2020 10:28 AM      Passed - Valid encounter within last 12 months    Recent Outpatient Visits          3 weeks ago Paroxysmal atrial fibrillation (HCC)   [facility] [place], Jolene T, NP   5 months ago Major depression in complete remission (HCC)   [facility] Gabriel Cirri, NP   5 months ago Erroneous encounter - disregard   [facility] Particia Nearing, [place]   5 months ago Abscess   [facility] Ro[place]velt Maser [place], [place]   6 months ago [facility], Salley Hews, [place]      Future Appointments            In 5 months [facility], [facility]            Due for refill February' 22

## 2020-09-13 DIAGNOSIS — H353211 Exudative age-related macular degeneration, right eye, with active choroidal neovascularization: Secondary | ICD-10-CM | POA: Diagnosis not present

## 2020-09-13 DIAGNOSIS — H353221 Exudative age-related macular degeneration, left eye, with active choroidal neovascularization: Secondary | ICD-10-CM | POA: Diagnosis not present

## 2020-10-06 DIAGNOSIS — H6123 Impacted cerumen, bilateral: Secondary | ICD-10-CM | POA: Diagnosis not present

## 2020-10-06 DIAGNOSIS — H903 Sensorineural hearing loss, bilateral: Secondary | ICD-10-CM | POA: Diagnosis not present

## 2020-10-26 DIAGNOSIS — H353211 Exudative age-related macular degeneration, right eye, with active choroidal neovascularization: Secondary | ICD-10-CM | POA: Diagnosis not present

## 2020-10-26 DIAGNOSIS — H353221 Exudative age-related macular degeneration, left eye, with active choroidal neovascularization: Secondary | ICD-10-CM | POA: Diagnosis not present

## 2020-11-01 DIAGNOSIS — R600 Localized edema: Secondary | ICD-10-CM | POA: Diagnosis not present

## 2020-11-01 DIAGNOSIS — Z8673 Personal history of transient ischemic attack (TIA), and cerebral infarction without residual deficits: Secondary | ICD-10-CM | POA: Diagnosis not present

## 2020-11-01 DIAGNOSIS — I10 Essential (primary) hypertension: Secondary | ICD-10-CM | POA: Diagnosis not present

## 2020-11-01 DIAGNOSIS — I480 Paroxysmal atrial fibrillation: Secondary | ICD-10-CM | POA: Diagnosis not present

## 2020-11-01 DIAGNOSIS — E669 Obesity, unspecified: Secondary | ICD-10-CM | POA: Diagnosis not present

## 2020-11-01 DIAGNOSIS — R011 Cardiac murmur, unspecified: Secondary | ICD-10-CM | POA: Diagnosis not present

## 2020-11-01 DIAGNOSIS — E782 Mixed hyperlipidemia: Secondary | ICD-10-CM | POA: Diagnosis not present

## 2020-11-01 DIAGNOSIS — E079 Disorder of thyroid, unspecified: Secondary | ICD-10-CM | POA: Diagnosis not present

## 2020-11-01 DIAGNOSIS — G479 Sleep disorder, unspecified: Secondary | ICD-10-CM | POA: Diagnosis not present

## 2020-11-15 ENCOUNTER — Telehealth: Payer: Self-pay

## 2020-11-15 ENCOUNTER — Ambulatory Visit: Payer: PPO | Admitting: Gastroenterology

## 2020-11-15 ENCOUNTER — Other Ambulatory Visit: Payer: Self-pay

## 2020-11-15 VITALS — BP 166/69 | HR 69 | Ht 67.0 in | Wt 196.0 lb

## 2020-11-15 DIAGNOSIS — K648 Other hemorrhoids: Secondary | ICD-10-CM | POA: Diagnosis not present

## 2020-11-15 DIAGNOSIS — K625 Hemorrhage of anus and rectum: Secondary | ICD-10-CM

## 2020-11-15 MED ORDER — HYDROCORTISONE ACETATE 25 MG RE SUPP: 25.0000 mg | Freq: Two times a day (BID) | RECTAL | 0 refills | Status: AC

## 2020-11-15 NOTE — Telephone Encounter (Signed)
Patient daughter is calling about questions about the Anusol suppository you prescribed

## 2020-11-15 NOTE — Progress Notes (Signed)
[name] MD, MRCP(U.K) [address]  [place]  [place], [place] [address]  Main: [phone]  Fax: [phone]   Primary Care Physician: Jon Billings, NP  Primary Gastroenterologist:  Dr. [name]   Rectal bleeding follow up   HPI: Mckenzie Gutierrez is a 87 y.o. female    Summary of history :  She was initially referred and seen on 05/03/2020 for rectal bleeding.  In June 2021 her hemoglobin was 12.8 g.  She has been on Eliquis for atrial fibrillation.  She is very hard of hearing.  The bleeding was painless and noticed blood in the toilet bowl.  She has suffered from constipation in the past and prior agents being used including Metamucil, Colace and MiraLAX.   Due to her age the relative risks of anesthesia it was decided the best option would be to perform an unsedated flexible sigmoidoscopy to evaluate the rectum and sigmoid colon to rule out any masslike lesion.  She was advised on conservative management for her internal hemorrhoids.  05/14/2020 flexible sigmoidoscopy: Grade 2 internal hemorrhoids were noted.  No other bleeding from any other site was noted.  Interval history     She states that she has had return of constipation despite taking the stool softeners.  She continues to have rectal bleeding associated with passage of hard stools.  Current Outpatient Medications  Medication Sig Dispense Refill  . albuterol (VENTOLIN HFA) 108 (90 Base) MCG/ACT inhaler Inhale 2 puffs into the lungs every 6 (six) hours as needed for wheezing or shortness of breath. 18 g 0  . amLODipine (NORVASC) 5 MG tablet TAKE 1 TABLET BY MOUTH DAILY 90 tablet 1  . apixaban (ELIQUIS) 2.5 MG TABS tablet Take 2.5 mg by mouth 2 (two) times daily.    . beta carotene w/minerals (OCUVITE) tablet Take 1 tablet by mouth daily.    . Biotin 5000 MCG CAPS Take by mouth daily.    . citalopram (CELEXA) 20 MG tablet Take 1 tablet (20 mg total) by mouth daily. 90 tablet 3  . docusate sodium  (COLACE) 100 MG capsule TAKE 1 CAPSULE BY MOUTH TWICE DAILY 60 capsule 1  . furosemide (LASIX) 20 MG tablet TAKE ONE TABLET EVERY DAY 90 tablet 1  . levothyroxine (SYNTHROID) 75 MCG tablet TAKE 1 TABLET EVERY DAY ON EMPTY STOMACHWITH A GLASS OF WATER AT LEAST 30-60 MINBEFORE BREAKFAST 90 tablet 3  . losartan (COZAAR) 100 MG tablet TAKE ONE TABLET EVERY DAY 90 tablet 1  . Multiple Vitamin (MULTIVITAMIN) tablet Take 1 tablet by mouth daily.    . pantoprazole (PROTONIX) 20 MG tablet TAKE 1 TABLET BY MOUTH DAILY 90 tablet 1  . polyethylene glycol (MIRALAX / GLYCOLAX) packet MIX ONE PACKAGE IN LIQUID AND TAKE DAILY 28 each 12  . psyllium (METAMUCIL) 58.6 % packet Take 1 packet by mouth daily.      No current facility-administered medications for this visit.    Allergies as of 11/15/2020 - Review Complete 08/12/2020  Allergen Reaction Noted  . Celebrex [celecoxib] Other (See Comments) 01/11/2016  . Codeine sulfate Other (See Comments) 07/12/2015  . Hydrochlorothiazide  07/12/2015  . Hytrin [terazosin]  07/12/2015  . Plendil [felodipine] Diarrhea and Nausea Only 07/12/2015  . Nitrofuran derivatives Swelling and Rash 07/12/2015  . Nitrofurantoin Rash and Swelling 07/12/2015    ROS:  General: Negative for anorexia, weight loss, fever, chills, fatigue, weakness. ENT: Negative for hoarseness, difficulty swallowing , nasal congestion. CV: Negative for chest pain, angina, palpitations,  dyspnea on exertion, peripheral edema.  Respiratory: Negative for dyspnea at rest, dyspnea on exertion, cough, sputum, wheezing.  GI: See history of present illness. GU:  Negative for dysuria, hematuria, urinary incontinence, urinary frequency, nocturnal urination.  Endo: Negative for unusual weight change.    Physical Examination:   LMP  (LMP Unknown)   General: Well-nourished, well-developed in no acute distress.  Eyes: No icterus. Conjunctivae pink. Neuro: Alert and oriented x 3.  Grossly intact. Skin:  Warm and dry, no jaundice.   Psych: Alert and cooperative, normal mood and affect.   Imaging Studies: No results found.  Assessment and Plan:   Mckenzie Gutierrez is a 87 y.o. y/o female  here to follow-up for rectal bleeding.  Recent colonoscopy demonstrated grade 2 internal hemorrhoids.  She continues to have rectal bleeding associated with passage of hard stools at this point of time.  At her last visit the constipation had resolved.  Plan 1.  Commenced on Linzess 145 mcg once a day.  Samples be provided for 2 weeks 2.  Sitz bath, trial of Anusol. 3.  If no better in a few weeks will consider further evaluation and banding of internal hemorrhoids 4.  Check CBC  Dr [name]  MD,MRCP [facility]) Follow up in 4 weeks

## 2020-11-16 DIAGNOSIS — K625 Hemorrhage of anus and rectum: Secondary | ICD-10-CM | POA: Diagnosis not present

## 2020-11-16 NOTE — Telephone Encounter (Signed)
Spoke with pt's daughter [name] and was able to answer her questions.

## 2020-11-17 LAB — CBC
Hematocrit: 34.9 % (ref 34.0–46.6)
Hemoglobin: 11.8 g/dL (ref 11.1–15.9)
MCH: 33.8 pg — ABNORMAL HIGH (ref 26.6–33.0)
MCHC: 33.8 g/dL (ref 31.5–35.7)
MCV: 100 fL — ABNORMAL HIGH (ref 79–97)
NRBC: 1 % — ABNORMAL HIGH (ref 0–0)
Platelets: 171 10*3/uL (ref 150–450)
RBC: 3.49 x10E6/uL — ABNORMAL LOW (ref 3.77–5.28)
RDW: 15.5 % — ABNORMAL HIGH (ref 11.7–15.4)
WBC: 7.5 10*3/uL (ref 3.4–10.8)

## 2020-11-19 ENCOUNTER — Telehealth: Payer: Self-pay | Admitting: Gastroenterology

## 2020-11-19 ENCOUNTER — Other Ambulatory Visit: Payer: Self-pay

## 2020-11-19 MED ORDER — LINACLOTIDE 145 MCG PO CAPS: 145.0000 ug | ORAL_CAPSULE | Freq: Every day | ORAL | 1 refills | Status: DC

## 2020-11-19 NOTE — Telephone Encounter (Signed)
Patient's daughter called LVM- wants Dr Vicente Males to know that patient has formed stools, no bleeding, using Linzess and suppository 1 time daily.  Needs prescription for Linzess sent to [facility].  Daughter also asked "were her labs okay?'  Please call daughter Mckenzie Gutierrez at [phone]

## 2020-11-22 NOTE — Progress Notes (Signed)
Inform that the hemoglobin is 11.8 g which is very similar to what it was 5 months back.  No significant blood loss.  Inquire how she is doing with her hemorrhoids

## 2020-11-22 NOTE — Telephone Encounter (Signed)
Patient's daughter called second time.  She would like a call  Back to ask a few questions about her Mother's medication and results.

## 2020-11-22 NOTE — Telephone Encounter (Signed)
See result note from labs.

## 2020-11-23 NOTE — Telephone Encounter (Signed)
-----   Message from [name], MD sent at 11/22/2020  2:53 PM EDT ----- Inform that the hemoglobin is 11.8 g which is very similar to what it was 5 months back.  No significant blood loss.  Inquire how she is doing with her hemorrhoids

## 2020-11-23 NOTE — Telephone Encounter (Signed)
Pt has been notified of results and Dr. Anna's recommendations. 

## 2020-12-01 ENCOUNTER — Other Ambulatory Visit: Payer: Self-pay | Admitting: Family Medicine

## 2020-12-02 ENCOUNTER — Other Ambulatory Visit: Payer: Self-pay | Admitting: Family Medicine

## 2020-12-03 ENCOUNTER — Other Ambulatory Visit: Payer: Self-pay | Admitting: Nurse Practitioner

## 2020-12-08 ENCOUNTER — Other Ambulatory Visit: Payer: Self-pay | Admitting: Nurse Practitioner

## 2020-12-09 ENCOUNTER — Ambulatory Visit (INDEPENDENT_AMBULATORY_CARE_PROVIDER_SITE_OTHER): Payer: PPO | Admitting: Nurse Practitioner

## 2020-12-09 ENCOUNTER — Ambulatory Visit
Admission: RE | Admit: 2020-12-09 | Discharge: 2020-12-09 | Disposition: A | Discharge status: Home / Self Care | Payer: PPO | Source: Ambulatory Visit | Attending: Nurse Practitioner | Admitting: Nurse Practitioner

## 2020-12-09 ENCOUNTER — Other Ambulatory Visit: Payer: Self-pay

## 2020-12-09 ENCOUNTER — Ambulatory Visit
Admission: RE | Admit: 2020-12-09 | Discharge: 2020-12-09 | Disposition: A | Discharge status: Home / Self Care | Payer: PPO | Attending: Nurse Practitioner | Admitting: Nurse Practitioner

## 2020-12-09 ENCOUNTER — Encounter: Payer: Self-pay | Admitting: Nurse Practitioner

## 2020-12-09 VITALS — BP 150/75 | HR 77 | Temp 98.3°F | Wt 196.0 lb

## 2020-12-09 DIAGNOSIS — W19XXXA Unspecified fall, initial encounter: Secondary | ICD-10-CM | POA: Diagnosis not present

## 2020-12-09 DIAGNOSIS — M79642 Pain in left hand: Secondary | ICD-10-CM | POA: Diagnosis not present

## 2020-12-09 DIAGNOSIS — M79641 Pain in right hand: Secondary | ICD-10-CM

## 2020-12-09 DIAGNOSIS — M19041 Primary osteoarthritis, right hand: Secondary | ICD-10-CM | POA: Insufficient documentation

## 2020-12-09 DIAGNOSIS — M19042 Primary osteoarthritis, left hand: Secondary | ICD-10-CM | POA: Diagnosis not present

## 2020-12-09 NOTE — Patient Instructions (Signed)
It was great to see you!  Go to the [facility] office to get x-rays of both hands. You can try voltaren gel over the counter or icy hot. Place ice on your hands for 10 minutes several times a day. We will follow-up with the results of x-rays.   Let's follow-up in 2 months, sooner if you have concerns.  Take care,  Vance Peper, NP

## 2020-12-09 NOTE — Progress Notes (Addendum)
Acute Office Visit  Subjective:    Patient ID: Mckenzie Gutierrez, female    DOB: [birth year], 87 y.o.   MRN: [id number]  Chief Complaint  Patient presents with  . Hand Pain    Bilateral for the last 2 weeks, states she had a fall in the yard and her hands have been gradually starting to hurt more since then     HPI Patient is in today for hand pain since a fall  HAND PAIN  Duration: weeks--2 weeks ago Involved hand: bilateral Mechanism of injury: trauma, fall--landed on her back Location: diffuse Onset: sudden Severity: moderate  Quality: dull Frequency: constant Radiation: no Aggravating factors: bending fingers, opening doors Alleviating factors:  Treatments attempted: tylenol Relief with NSAIDs?: No NSAIDs Taken Weakness: yes Numbness: no Redness: no Swelling:yes Bruising: yes Fevers: no  Past Medical History:  Diagnosis Date  . Anxiety   . Atrial premature beats   . Bradycardia   . Carotid stenosis   . Depression   . GERD (gastroesophageal reflux disease)   . Hyperlipidemia   . Hypertension   . Low back pain   . Osteoporosis   . Thyroid disease   . Urinary frequency     Past Surgical History:  Procedure Laterality Date  . CARPAL TUNNEL RELEASE    . FLEXIBLE SIGMOIDOSCOPY N/A 05/13/2020   Procedure: FLEXIBLE SIGMOIDOSCOPY;  Surgeon: [name], MD;  Location: [facility] ENDOSCOPY;  Service: Gastroenterology;  Laterality: N/A;  Unsedated  . WRIST SURGERY      Family History  Problem Relation Age of Onset  . Heart disease Mother   . Stroke Father   . Diabetes Father   . Stroke Sister     Social History   Socioeconomic History  . Marital status: Widowed    Spouse name: Not on file  . Number of children: Not on file  . Years of education: Not on file  . Highest education level: Not on file  Occupational History  . Not on file  Tobacco Use  . Smoking status: Never Smoker  . Smokeless tobacco: Never Used  Vaping Use  . Vaping Use: Never used   Substance and Sexual Activity  . Alcohol use: No    Alcohol/week: 0.0 standard drinks  . Drug use: No  . Sexual activity: Never  Other Topics Concern  . Not on file  Social History Narrative  . Not on file   Social Determinants of Health   Financial Resource Strain: Low Risk   . Difficulty of Paying Living Expenses: Not hard at all  Food Insecurity: No Food Insecurity  . Worried About Charity fundraiser in the Last Year: Never true  . Ran Out of Food in the Last Year: Never true  Transportation Needs: No Transportation Needs  . Lack of Transportation (Medical): No  . Lack of Transportation (Non-Medical): No  Physical Activity: Inactive  . Days of Exercise per Week: 0 days  . Minutes of Exercise per Session: 0 min  Stress: Not on file  Social Connections: Moderately Isolated  . Frequency of Communication with Friends and Family: More than three times a week  . Frequency of Social Gatherings with Friends and Family: More than three times a week  . Attends Religious Services: More than 4 times per year  . Active Member of Clubs or Organizations: No  . Attends Archivist Meetings: Never  . Marital Status: Widowed  Intimate Partner Violence: Not on file    Outpatient Medications Prior  to Visit  Medication Sig Dispense Refill  . amLODipine (NORVASC) 5 MG tablet Take 1 tablet (5 mg total) by mouth daily. OFFICE VISIT NEEDED FOR ADDITIONAL REFILLS 90 tablet 0  . apixaban (ELIQUIS) 2.5 MG TABS tablet Take 2.5 mg by mouth 2 (two) times daily.    . beta carotene w/minerals (OCUVITE) tablet Take 1 tablet by mouth daily.    . Biotin 5000 MCG CAPS Take by mouth daily.    . citalopram (CELEXA) 20 MG tablet Take 1 tablet (20 mg total) by mouth daily. 90 tablet 3  . furosemide (LASIX) 20 MG tablet TAKE ONE TABLET EVERY DAY 90 tablet 0  . levothyroxine (SYNTHROID) 75 MCG tablet TAKE 1 TABLET EVERY DAY ON EMPTY STOMACHWITH A GLASS OF WATER AT LEAST 30-60 MINBEFORE BREAKFAST 90  tablet 3  . linaclotide (LINZESS) 145 MCG CAPS capsule Take 1 capsule (145 mcg total) by mouth daily before breakfast. 90 capsule 1  . losartan (COZAAR) 100 MG tablet TAKE ONE TABLET EVERY DAY 90 tablet 0  . Multiple Vitamin (MULTIVITAMIN) tablet Take 1 tablet by mouth daily.    . pantoprazole (PROTONIX) 20 MG tablet TAKE 1 TABLET BY MOUTH DAILY 90 tablet 0  . albuterol (VENTOLIN HFA) 108 (90 Base) MCG/ACT inhaler Inhale 2 puffs into the lungs every 6 (six) hours as needed for wheezing or shortness of breath. (Patient not taking: Reported on 12/09/2020) 18 g 0  . docusate sodium (COLACE) 100 MG capsule TAKE 1 CAPSULE BY MOUTH TWICE DAILY 180 capsule 2  . polyethylene glycol (MIRALAX / GLYCOLAX) packet MIX ONE PACKAGE IN LIQUID AND TAKE DAILY 28 each 12  . psyllium (METAMUCIL) 58.6 % packet Take 1 packet by mouth daily.      No facility-administered medications prior to visit.    Allergies  Allergen Reactions  . Celebrex [Celecoxib] Other (See Comments)    Hard stools  . Codeine Sulfate Other (See Comments)  . Hydrochlorothiazide   . Hytrin [Terazosin]   . Plendil [Felodipine] Diarrhea and Nausea Only  . Nitrofuran Derivatives Swelling and Rash  . Nitrofurantoin Rash and Swelling    Review of Systems  Respiratory: Negative.   Cardiovascular: Negative.   Neurological: Negative.        Objective:    Physical Exam Vitals and nursing note reviewed.  Constitutional:      General: She is not in acute distress.    Appearance: Normal appearance.  HENT:     Head: Normocephalic.     Ears:     Comments: Hard of hearing Eyes:     Conjunctiva/sclera: Conjunctivae normal.  Cardiovascular:     Rate and Rhythm: Normal rate and regular rhythm.     Pulses: Normal pulses.     Heart sounds: Normal heart sounds.  Pulmonary:     Effort: Pulmonary effort is normal.     Breath sounds: Normal breath sounds.  Musculoskeletal:        General: Swelling, tenderness and signs of injury present.      Cervical back: Normal range of motion.     Comments: Swelling to right thumb with bruising from her thumb to her right anterior wrist. Swelling to left posterior hand with tenderness to palpation.   Skin:    General: Skin is warm.     Findings: Bruising (right hand and anterior wrist) present.  Neurological:     General: No focal deficit present.     Mental Status: She is alert and oriented to person, place, and time.  Psychiatric:        Mood and Affect: Mood normal.        Behavior: Behavior normal.        Thought Content: Thought content normal.        Judgment: Judgment normal.     BP (!) 150/75   Pulse 77   Temp 98.3 F (36.8 C) (Oral)   Wt 196 lb (88.9 kg)   LMP  (LMP Unknown)   SpO2 97%   BMI 30.70 kg/m  Wt Readings from Last 3 Encounters:  12/09/20 196 lb (88.9 kg)  11/15/20 196 lb (88.9 kg)  08/12/20 197 lb 12.8 oz (89.7 kg)    Lab Results  Component Value Date   TSH 2.760 08/12/2020   Lab Results  Component Value Date   WBC 7.5 11/16/2020   HGB 11.8 11/16/2020   HCT 34.9 11/16/2020   MCV 100 (H) 11/16/2020   PLT 171 11/16/2020   Lab Results  Component Value Date   NA 142 08/12/2020   K 4.0 08/12/2020   CO2 22 08/12/2020   GLUCOSE 109 (H) 08/12/2020   BUN 9 (L) 08/12/2020   CREATININE 0.76 08/12/2020   BILITOT 1.1 02/15/2020   ALKPHOS 61 02/15/2020   AST 30 02/15/2020   ALT 16 02/15/2020   PROT 7.5 02/15/2020   ALBUMIN 4.2 02/15/2020   CALCIUM 8.8 08/12/2020   ANIONGAP 8 02/15/2020   Lab Results  Component Value Date   CHOL 196 02/11/2020   Lab Results  Component Value Date   HDL 58 02/11/2020   Lab Results  Component Value Date   LDLCALC 119 (H) 02/11/2020   Lab Results  Component Value Date   TRIG 106 02/11/2020   No results found for: [facility] Lab Results  Component Value Date   HGBA1C 6.0 (H) 07/07/2020       Assessment & Plan:   Problem List Items Addressed This Visit      Other   Fall - Primary    Fell in back  yard 2 weeks ago after tripping in grass. She states she landed on her back. Hurt her bilateral hands with fall, which pain has not resolved. Denies LOC, head pain, back pain. Will check x-rays of bilateral hands. Continue to take tylenol as needed for pain. Use ice to hands 4 times a day. Can try voltaren gel OTC to help with pain. Awaiting x-ray results. May need referral to orthopedics. She lives at home alone. Has life alert in case of emergencies. Her son lives nearby.        Relevant Orders   DG Hand Complete Left   DG Hand Complete Right    Other Visit Diagnoses    Bilateral hand pain       Order x-rays of bilateral hands from injury, pain, and edema 2 weeks post-fall. Will await results to determine treatment plan.    Relevant Orders   DG Hand Complete Left   DG Hand Complete Right       No orders of the defined types were placed in this encounter.    Charyl Dancer, NP

## 2020-12-09 NOTE — Assessment & Plan Note (Addendum)
Fell in back yard 2 weeks ago after tripping in grass. She states she landed on her back. Hurt her bilateral hands with fall, which pain has not resolved. Denies LOC, head pain, back pain. Will check x-rays of bilateral hands. Continue to take tylenol as needed for pain. Use ice to hands 4 times a day. Can try voltaren gel OTC to help with pain. Awaiting x-ray results. May need referral to orthopedics. She lives at home alone. Has life alert in case of emergencies. Her son lives nearby.

## 2020-12-14 DIAGNOSIS — H353211 Exudative age-related macular degeneration, right eye, with active choroidal neovascularization: Secondary | ICD-10-CM | POA: Diagnosis not present

## 2020-12-14 DIAGNOSIS — H353221 Exudative age-related macular degeneration, left eye, with active choroidal neovascularization: Secondary | ICD-10-CM | POA: Diagnosis not present

## 2020-12-16 ENCOUNTER — Other Ambulatory Visit: Payer: Self-pay | Admitting: Family Medicine

## 2020-12-30 ENCOUNTER — Ambulatory Visit: Payer: PPO | Admitting: Gastroenterology

## 2020-12-30 ENCOUNTER — Other Ambulatory Visit: Payer: Self-pay

## 2020-12-30 ENCOUNTER — Encounter: Payer: Self-pay | Admitting: Gastroenterology

## 2020-12-30 VITALS — BP 126/70 | HR 80 | Ht 67.0 in | Wt 191.8 lb

## 2020-12-30 DIAGNOSIS — K5900 Constipation, unspecified: Secondary | ICD-10-CM

## 2020-12-30 DIAGNOSIS — K648 Other hemorrhoids: Secondary | ICD-10-CM

## 2020-12-30 DIAGNOSIS — K625 Hemorrhage of anus and rectum: Secondary | ICD-10-CM | POA: Diagnosis not present

## 2020-12-30 NOTE — Progress Notes (Signed)
[name] MD, MRCP(U.K) [address]  [place]  [place], [place] [address]  Main: [phone]  Fax: [phone]   Primary Care Physician: Jon Billings, NP  Primary Gastroenterologist:  Dr. [name]   Chief Complaint  Patient presents with  . office visit    4 wk f/u hemorrhoids    HPI: ALITA WALDREN is a 87 y.o. female    Summary of history :  She was initially referred and seen on 05/03/2020 for rectal bleeding. In June 2021 her hemoglobin was 12.8 g. She has been on Eliquis for atrial fibrillation. She is very hard of hearing. The bleeding was painless and noticed blood in the toilet bowl. She has suffered from constipation in the past and prior agents being used including Metamucil, Colace and MiraLAX. Due to her age the relative risks of anesthesia it was decided the best option would be to perform an unsedated flexible sigmoidoscopy to evaluate the rectum and sigmoid colon to rule out any masslike lesion. She was advised on conservative management for her internal hemorrhoids.  05/14/2020 flexible sigmoidoscopy: Grade 2 internal hemorrhoids were noted. No other bleeding from any other site was noted.  Interval history    Last visit I started her on Linzess 145 mcg along with suppositories for hemorrhoids.  She has not had any bleeding for 3 to 3-1/2 weeks.  Constipation is better but still has a sensation that she needs to defecate but not able to push her stool out completely..    Current Outpatient Medications  Medication Sig Dispense Refill  . amLODipine (NORVASC) 5 MG tablet Take 1 tablet (5 mg total) by mouth daily. OFFICE VISIT NEEDED FOR ADDITIONAL REFILLS 90 tablet 0  . apixaban (ELIQUIS) 2.5 MG TABS tablet Take 2.5 mg by mouth 2 (two) times daily.    . beta carotene w/minerals (OCUVITE) tablet Take 1 tablet by mouth daily.    . Biotin 5000 MCG CAPS Take by mouth daily.    . citalopram (CELEXA) 20 MG tablet Take 1 tablet (20  mg total) by mouth daily. 90 tablet 3  . furosemide (LASIX) 20 MG tablet TAKE ONE TABLET EVERY DAY 90 tablet 0  . hydrocortisone (ANUSOL-HC) 25 MG suppository Place 25 mg rectally 2 (two) times daily as needed.    Marland Kitchen levothyroxine (SYNTHROID) 75 MCG tablet TAKE 1 TABLET EVERY DAY ON EMPTY STOMACHWITH A GLASS OF WATER AT LEAST 30-60 MINBEFORE BREAKFAST 90 tablet 3  . linaclotide (LINZESS) 145 MCG CAPS capsule Take 1 capsule (145 mcg total) by mouth daily before breakfast. 90 capsule 1  . losartan (COZAAR) 100 MG tablet TAKE ONE TABLET EVERY DAY 90 tablet 0  . Multiple Vitamin (MULTIVITAMIN) tablet Take 1 tablet by mouth daily.    . pantoprazole (PROTONIX) 20 MG tablet TAKE 1 TABLET BY MOUTH DAILY 90 tablet 0  . albuterol (VENTOLIN HFA) 108 (90 Base) MCG/ACT inhaler Inhale 2 puffs into the lungs every 6 (six) hours as needed for wheezing or shortness of breath. (Patient not taking: No sig reported) 18 g 0   No current facility-administered medications for this visit.    Allergies as of 12/30/2020 - Review Complete 12/30/2020  Allergen Reaction Noted  . Celebrex [celecoxib] Other (See Comments) 01/11/2016  . Codeine sulfate Other (See Comments) 07/12/2015  . Hydrochlorothiazide  07/12/2015  . Hytrin [terazosin]  07/12/2015  . Plendil [felodipine] Diarrhea and Nausea Only 07/12/2015  . Nitrofuran derivatives Swelling and Rash 07/12/2015  . Nitrofurantoin Rash and Swelling 07/12/2015  ROS:  General: Negative for anorexia, weight loss, fever, chills, fatigue, weakness. ENT: Negative for hoarseness, difficulty swallowing , nasal congestion. CV: Negative for chest pain, angina, palpitations, dyspnea on exertion, peripheral edema.  Respiratory: Negative for dyspnea at rest, dyspnea on exertion, cough, sputum, wheezing.  GI: See history of present illness. GU:  Negative for dysuria, hematuria, urinary incontinence, urinary frequency, nocturnal urination.  Endo: Negative for unusual weight  change.    Physical Examination:   BP 126/70 (BP Location: Left Arm, Patient Position: Sitting, Cuff Size: Large)   Pulse 80   Ht 5\' 7"  (1.702 m)   Wt 191 lb 12.8 oz (87 kg)   LMP  (LMP Unknown)   BMI 30.04 kg/m   General: Well-nourished, well-developed in no acute distress.  Eyes: No icterus. Conjunctivae pink. Neuro: Alert and oriented x 3.  Grossly intact. Skin: Warm and dry, no jaundice.   Psych: Alert and cooperative, normal mood and affect.   Imaging Studies: DG Hand Complete Left  Result Date: 12/11/2020 CLINICAL DATA:  [place], left hand pain at base of thumb, bruising EXAM: LEFT HAND - COMPLETE 3+ VIEW COMPARISON:  None. FINDINGS: Frontal, oblique, and lateral views of the left hand are obtained. There are no acute displaced fractures. There is diffuse interphalangeal joint space narrowing consistent with osteoarthritis. Additionally, there is marked joint space narrowing and osteophyte formation within the radial aspect of the carpus, and at the first carpometacarpal joint, consistent with osteoarthritis. Soft tissues are unremarkable. IMPRESSION: 1. 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 2. No acute displaced fracture. Electronically Signed   By: Randa Ngo M.D.   On: 12/11/2020 08:08   DG Hand Complete Right  Result Date: 12/11/2020 CLINICAL DATA:  [place], right hand pain and bruising, swelling of index finger EXAM: RIGHT HAND - COMPLETE 3+ VIEW COMPARISON:  None. FINDINGS: Frontal, oblique, lateral views of the right hand are obtained. No acute fracture, subluxation, or dislocation. There is diffuse interphalangeal joint space narrowing consistent with osteoarthritis. Mild joint space narrowing also noted within within the radial aspect of the carpus and radiocarpal joint. Soft tissues are unremarkable. IMPRESSION: 1. Multifocal osteoarthritis. 2. No acute displaced fracture. Electronically Signed   By: Randa Ngo M.D.   On: 12/11/2020  08:11    Assessment and Plan:   LATOSHA GAYLORD is a 87 y.o. y/o female  here to follow-up for rectal bleeding. Flexible sigmoidoscopy showed internal hemorrhoids.  She has responded to a trial of Anusol, sitz bath and Linzess 145 mcg.  Still has a degree of constipation.  I suggested she add 1 capful of MiraLAX daily.  If no better in 2 to 3 weeks she will give me a call and I will increase the Linzess to an additional dose of 72 mcg along with the 145 mcg to be taken daily.   Dr [name]  MD,MRCP [facility]) Follow up in as needed

## 2021-01-06 ENCOUNTER — Telehealth: Payer: Self-pay

## 2021-01-06 NOTE — Telephone Encounter (Signed)
Daughter says each time the patient goes to the restroom she's heavily bleeding. She used the restroom 3xs so far. Blood is bright red. Daughter said the blood is spattering  on toilet seat, walls and floor. Currinently taking Linzess and Miralax. Please Triage [phone] Hermenia Bers  (Daughter)

## 2021-01-13 ENCOUNTER — Telehealth: Payer: Self-pay

## 2021-01-13 NOTE — Telephone Encounter (Signed)
Returned patient's daughter call. Informed patient of Dr. Georgeann Oppenheim recommendations. Pt should take the Miralax consistently. If this does not help then offer to try Trulance. Daughter verbalized understanding.

## 2021-01-13 NOTE — Telephone Encounter (Signed)
Patient says her mother is getting a little relief this week. Is taking Miralx and linzess. Doesn't take the miralax consistently and has constipation as a result. Please call daughter.

## 2021-02-07 ENCOUNTER — Telehealth: Payer: Self-pay | Admitting: Nurse Practitioner

## 2021-02-07 NOTE — Telephone Encounter (Signed)
Copied from [place] [id number]. Topic: Medicare AWV >> Feb 07, 2021 10:40 AM Cher Nakai R wrote: Reason for CRM:   6/6 LM re: Need to r/s AWV from 6/15 to 6/13 @ 9:45am  to be done by phone not in the office -srs

## 2021-02-14 ENCOUNTER — Ambulatory Visit (INDEPENDENT_AMBULATORY_CARE_PROVIDER_SITE_OTHER): Payer: PPO

## 2021-02-14 VITALS — Ht 67.0 in | Wt 200.0 lb

## 2021-02-14 DIAGNOSIS — Z0000 Encounter for general adult medical examination without abnormal findings: Secondary | ICD-10-CM

## 2021-02-14 NOTE — Patient Instructions (Signed)
Mckenzie Gutierrez , Thank you for taking time to come for your Medicare Wellness Visit. I appreciate your ongoing commitment to your health goals. Please review the following plan we discussed and let me know if I can assist you in the future.   Screening recommendations/referrals: Colonoscopy: not required Mammogram: not required Bone Density: completed 08/09/2011 Recommended yearly ophthalmology/optometry visit for glaucoma screening and checkup Recommended yearly dental visit for hygiene and checkup  Vaccinations: Influenza vaccine: completed 06/02/2020, due 04/04/2021 Pneumococcal vaccine: completed 12/30/2013 Tdap vaccine: due Shingles vaccine: discussed   Covid-19: 06/11/2020, 10/19/2019, 09/27/2019  Advanced directives: Please bring a copy of your POA (Power of Attorney) and/or Living Will to your next appointment.   Conditions/risks identified: none  Next appointment: Follow up in one year for your annual wellness visit    Preventive Care 65 Years and Older, Female Preventive care refers to lifestyle choices and visits with your health care provider that can promote health and wellness. What does preventive care include? A yearly physical exam. This is also called an annual well check. Dental exams once or twice a year. Routine eye exams. Ask your health care provider how often you should have your eyes checked. Personal lifestyle choices, including: Daily care of your teeth and gums. Regular physical activity. Eating a healthy diet. Avoiding tobacco and drug use. Limiting alcohol use. Practicing safe sex. Taking low-dose aspirin every day. Taking vitamin and mineral supplements as recommended by your health care provider. What happens during an annual well check? The services and screenings done by your health care provider during your annual well check will depend on your age, overall health, lifestyle risk factors, and family history of disease. Counseling  Your health care  provider may ask you questions about your: Alcohol use. Tobacco use. Drug use. Emotional well-being. Home and relationship well-being. Sexual activity. Eating habits. History of falls. Memory and ability to understand (cognition). Work and work Statistician. Reproductive health. Screening  You may have the following tests or measurements: Height, weight, and BMI. Blood pressure. Lipid and cholesterol levels. These may be checked every 5 years, or more frequently if you are over 54 years old. Skin check. Lung cancer screening. You may have this screening every year starting at age 64 if you have a 30-pack-year history of smoking and currently smoke or have quit within the past 15 years. Fecal occult blood test (FOBT) of the stool. You may have this test every year starting at age 53. Flexible sigmoidoscopy or colonoscopy. You may have a sigmoidoscopy every 5 years or a colonoscopy every 10 years starting at age 67. Hepatitis C blood test. Hepatitis B blood test. Sexually transmitted disease (STD) testing. Diabetes screening. This is done by checking your blood sugar (glucose) after you have not eaten for a while (fasting). You may have this done every 1-3 years. Bone density scan. This is done to screen for osteoporosis. You may have this done starting at age 17. Mammogram. This may be done every 1-2 years. Talk to your health care provider about how often you should have regular mammograms. Talk with your health care provider about your test results, treatment options, and if necessary, the need for more tests. Vaccines  Your health care provider may recommend certain vaccines, such as: Influenza vaccine. This is recommended every year. Tetanus, diphtheria, and acellular pertussis (Tdap, Td) vaccine. You may need a Td booster every 10 years. Zoster vaccine. You may need this after age 36. Pneumococcal 13-valent conjugate (PCV13) vaccine. One dose is  recommended after age  76. Pneumococcal polysaccharide (PPSV23) vaccine. One dose is recommended after age 10. Talk to your health care provider about which screenings and vaccines you need and how often you need them. This information is not intended to replace advice given to you by your health care provider. Make sure you discuss any questions you have with your health care provider. Document Released: 09/17/2015 Document Revised: 05/10/2016 Document Reviewed: 06/22/2015 Elsevier Interactive Patient Education  2017 [employer] Prevention in the Home Falls can cause injuries. They can happen to people of all ages. There are many things you can do to make your home safe and to help prevent falls. What can I do on the outside of my home? Regularly fix the edges of walkways and driveways and fix any cracks. Remove anything that might make you trip as you walk through a door, such as a raised step or threshold. Trim any bushes or trees on the path to your home. Use bright outdoor lighting. Clear any walking paths of anything that might make someone trip, such as rocks or tools. Regularly check to see if handrails are loose or broken. Make sure that both sides of any steps have handrails. Any raised decks and porches should have guardrails on the edges. Have any leaves, snow, or ice cleared regularly. Use sand or salt on walking paths during winter. Clean up any spills in your garage right away. This includes oil or grease spills. What can I do in the bathroom? Use night lights. Install grab bars by the toilet and in the tub and shower. Do not use towel bars as grab bars. Use non-skid mats or decals in the tub or shower. If you need to sit down in the shower, use a plastic, non-slip stool. Keep the floor dry. Clean up any water that spills on the floor as soon as it happens. Remove soap buildup in the tub or shower regularly. Attach bath mats securely with double-sided non-slip rug tape. Do not have throw  rugs and other things on the floor that can make you trip. What can I do in the bedroom? Use night lights. Make sure that you have a light by your bed that is easy to reach. Do not use any sheets or blankets that are too big for your bed. They should not hang down onto the floor. Have a firm chair that has side arms. You can use this for support while you get dressed. Do not have throw rugs and other things on the floor that can make you trip. What can I do in the kitchen? Clean up any spills right away. Avoid walking on wet floors. Keep items that you use a lot in easy-to-reach places. If you need to reach something above you, use a strong step stool that has a grab bar. Keep electrical cords out of the way. Do not use floor polish or wax that makes floors slippery. If you must use wax, use non-skid floor wax. Do not have throw rugs and other things on the floor that can make you trip. What can I do with my stairs? Do not leave any items on the stairs. Make sure that there are handrails on both sides of the stairs and use them. Fix handrails that are broken or loose. Make sure that handrails are as long as the stairways. Check any carpeting to make sure that it is firmly attached to the stairs. Fix any carpet that is loose or worn. Avoid having  throw rugs at the top or bottom of the stairs. If you do have throw rugs, attach them to the floor with carpet tape. Make sure that you have a light switch at the top of the stairs and the bottom of the stairs. If you do not have them, ask someone to add them for you. What else can I do to help prevent falls? Wear shoes that: Do not have high heels. Have rubber bottoms. Are comfortable and fit you well. Are closed at the toe. Do not wear sandals. If you use a stepladder: Make sure that it is fully opened. Do not climb a closed stepladder. Make sure that both sides of the stepladder are locked into place. Ask someone to hold it for you, if  possible. Clearly mark and make sure that you can see: Any grab bars or handrails. First and last steps. Where the edge of each step is. Use tools that help you move around (mobility aids) if they are needed. These include: Canes. Walkers. Scooters. Crutches. Turn on the lights when you go into a dark area. Replace any light bulbs as soon as they burn out. Set up your furniture so you have a clear path. Avoid moving your furniture around. If any of your floors are uneven, fix them. If there are any pets around you, be aware of where they are. Review your medicines with your doctor. Some medicines can make you feel dizzy. This can increase your chance of falling. Ask your doctor what other things that you can do to help prevent falls. This information is not intended to replace advice given to you by your health care provider. Make sure you discuss any questions you have with your health care provider. Document Released: 06/17/2009 Document Revised: 01/27/2016 Document Reviewed: 09/25/2014 Elsevier Interactive Patient Education  2017 [employer].

## 2021-02-14 NOTE — Progress Notes (Signed)
I connected with Caroleen Hamman today by telephone and verified that I am speaking with the correct person using two identifiers. Location patient: home Location provider: work Persons participating in the virtual visit: [place], Aviannah Castoro (son), Glenna Durand LPN   I discussed the limitations, risks, security and privacy concerns of performing an evaluation and management service by telephone and the availability of in person appointments. I also discussed with the patient that there may be a patient responsible charge related to this service. The patient expressed understanding and verbally consented to this telephonic visit.    Interactive audio and video telecommunications were attempted between this provider and patient, however failed, due to patient having technical difficulties OR patient did not have access to video capability.  We continued and completed visit with audio only.     Vital signs may be patient reported or missing.  Subjective:   Mckenzie Gutierrez is a 87 y.o. female who presents for Medicare Annual (Subsequent) preventive examination.  Review of Systems     Cardiac Risk Factors include: advanced age (>32men, >68 women);dyslipidemia;hypertension;obesity (BMI >30kg/m2);sedentary lifestyle     Objective:    Today's Vitals   02/14/21 0941  Weight: 200 lb (90.7 kg)  Height: 5\' 7"  (1.702 m)   Body mass index is 31.32 kg/m.  Advanced Directives 02/14/2021 02/15/2020 02/11/2020 02/20/2017 07/19/2016 03/01/2016 02/16/2016  Does Patient Have a Medical Advance Directive? Yes Yes Yes Yes Yes Yes Yes  Type of Paramedic of [place];Living will Healthcare Power of Attorney Living will;Healthcare Power of [place];Living will [place];Living will [place];Living will  Copy of [place] in Chart? No - copy requested - Yes - validated most recent copy  scanned in chart (See row information) - - No - copy requested No - copy requested    Current Medications (verified) Outpatient Encounter Medications as of 02/14/2021  Medication Sig   amLODipine (NORVASC) 5 MG tablet Take 1 tablet (5 mg total) by mouth daily. OFFICE VISIT NEEDED FOR ADDITIONAL REFILLS   apixaban (ELIQUIS) 2.5 MG TABS tablet Take 2.5 mg by mouth 2 (two) times daily.   beta carotene w/minerals (OCUVITE) tablet Take 1 tablet by mouth daily.   citalopram (CELEXA) 20 MG tablet Take 1 tablet (20 mg total) by mouth daily.   furosemide (LASIX) 20 MG tablet TAKE ONE TABLET EVERY DAY   hydrocortisone (ANUSOL-HC) 25 MG suppository Place 25 mg rectally 2 (two) times daily as needed.   levothyroxine (SYNTHROID) 75 MCG tablet TAKE 1 TABLET EVERY DAY ON EMPTY STOMACHWITH A GLASS OF WATER AT LEAST 30-60 MINBEFORE BREAKFAST   linaclotide (LINZESS) 145 MCG CAPS capsule Take 1 capsule (145 mcg total) by mouth daily before breakfast.   losartan (COZAAR) 100 MG tablet TAKE ONE TABLET EVERY DAY   Multiple Vitamin (MULTIVITAMIN) tablet Take 1 tablet by mouth daily.   pantoprazole (PROTONIX) 20 MG tablet TAKE 1 TABLET BY MOUTH DAILY   albuterol (VENTOLIN HFA) 108 (90 Base) MCG/ACT inhaler Inhale 2 puffs into the lungs every 6 (six) hours as needed for wheezing or shortness of breath. (Patient not taking: No sig reported)   Biotin 5000 MCG CAPS Take by mouth daily. (Patient not taking: Reported on 02/14/2021)   No facility-administered encounter medications on file as of 02/14/2021.    Allergies (verified) Celebrex [celecoxib], Codeine sulfate, Hydrochlorothiazide, Hytrin [terazosin], Plendil [felodipine], Nitrofuran derivatives, and Nitrofurantoin   History: Past Medical History:  Diagnosis  Date   Anxiety    Atrial premature beats    Bradycardia    Carotid stenosis    Depression    GERD (gastroesophageal reflux disease)    Hyperlipidemia    Hypertension    Low back pain    Osteoporosis     Thyroid disease    Urinary frequency    Past Surgical History:  Procedure Laterality Date   CARPAL TUNNEL RELEASE     FLEXIBLE SIGMOIDOSCOPY N/A 05/13/2020   Procedure: FLEXIBLE SIGMOIDOSCOPY;  Surgeon: [name], MD;  Location[facility] ENDOSCOPY;  Service: Gastroenterology;  Laterality: N/A;  Unsedated   WRIST SURGERY     Family History  Problem Relation Age of Onset   Heart disease Mother    Stroke Father    Diabetes Father    Stroke Sister    Social History   Socioeconomic History   Marital status: Widowed    Spouse name: Not on file   Number of children: Not on file   Years of education: Not on file   Highest education level: Not on file  Occupational History   Not on file  Tobacco Use   Smoking status: Never   Smokeless tobacco: Never  Vaping Use   Vaping Use: Never used  Substance and Sexual Activity   Alcohol use: No    Alcohol/week: 0.0 standard drinks   Drug use: No   Sexual activity: Not Currently  Other Topics Concern   Not on file  Social History Narrative   Not on file   Social Determinants of Health   Financial Resource Strain: Low Risk    Difficulty of Paying Living Expenses: Not hard at all  Food Insecurity: No Food Insecurity   Worried About Charity fundraiser in the Last Year: Never true   [place] in the Last Year: Never true  Transportation Needs: No Transportation Needs   Lack of Transportation (Medical): No   Lack of Transportation (Non-Medical): No  Physical Activity: Inactive   Days of Exercise per Week: 0 days   Minutes of Exercise per Session: 0 min  Stress: No Stress Concern Present   Feeling of Stress : Not at all  Social Connections: Not on file    Tobacco Counseling Counseling given: Not Answered   Clinical Intake:  Pre-visit preparation completed: Yes  Pain : No/denies pain     Nutritional Status: BMI > 30  Obese Nutritional Risks: None Diabetes: No  How often do you need to have someone help you when  you read instructions, pamphlets, or other written materials from your doctor or pharmacy?: 1 - Never  Diabetic?no  Interpreter Needed?: No  Information entered by :: NAllen LPN   Activities of Daily Living In your present state of health, do you have any difficulty performing the following activities: 02/14/2021  Hearing? Y  Vision? N  Difficulty concentrating or making decisions? N  Walking or climbing stairs? N  Dressing or bathing? N  Doing errands, shopping? N  Comment children takes to appointments  Preparing Food and eating ? N  Using the Toilet? N  In the past six months, have you accidently leaked urine? Y  Do you have problems with loss of bowel control? N  Managing your Medications? N  Managing your Finances? N  Housekeeping or managing your Housekeeping? N  Some recent data might be hidden    Patient Care Team: Jon Billings, NP as PCP - General Conception Chancy, DDS as Referring Physician (Dentistry)  Leandrew Koyanagi, MD as Referring Physician (Ophthalmology)  Indicate any recent Medical Services you may have received from other than [facility] providers in the past year (date may be approximate).     Assessment:   This is a routine wellness examination for [place].  Hearing/Vision screen Vision Screening - Comments:: Regular eye exams, [facility]  Dietary issues and exercise activities discussed: Current Exercise Habits: The patient does not participate in regular exercise at present   Goals Addressed             This Visit's Progress    Patient Stated       02/14/2021, no goals        Depression Screen PHQ 2/9 Scores 02/14/2021 08/12/2020 04/06/2020 03/05/2020 02/11/2020 08/11/2019 07/31/2018  PHQ - 2 Score 0 2 0 2 2 0 0  PHQ- 9 Score - 3 1 6 5 2  0    Fall Risk Fall Risk  02/14/2021 08/12/2020 02/19/2020 02/11/2020 08/11/2019  Falls in the past year? 1 0 0 0 1  Number falls in past yr: 1 0 0 0 0  Injury with Fall? 0 0 0 0 0  Risk for fall due  to : Impaired balance/gait;Medication side effect - - - -  Follow up Falls evaluation completed;Education provided;Falls prevention discussed Falls evaluation completed - - -    FALL RISK PREVENTION PERTAINING TO THE HOME:  Any stairs in or around the home? Yes  If so, are there any without handrails? No  Home free of loose throw rugs in walkways, pet beds, electrical cords, etc? Yes  Adequate lighting in your home to reduce risk of falls? Yes   ASSISTIVE DEVICES UTILIZED TO PREVENT FALLS:  Life alert? Yes  Use of a cane, walker or w/c? No  Grab bars in the bathroom? Yes  Shower chair or bench in shower? No  Elevated toilet seat or a handicapped toilet? No   TIMED UP AND GO:  Was the test performed? No .       Cognitive Function:     6CIT Screen 07/20/2017  What Year? 0 points  What month? 0 points  What time? 0 points  Count back from 20 0 points  Months in reverse 0 points  Repeat phrase 0 points  Total Score 0    Immunizations Immunization History  Administered Date(s) Administered   Fluad Quad(high Dose 65+) 06/11/2019   Influenza, High Dose Seasonal PF 06/07/2017, 06/17/2018   Influenza,inj,Quad PF,6+ Mos 06/15/2015   Influenza-Unspecified 06/19/2016, 06/02/2020   PFIZER(Purple Top)SARS-COV-2 Vaccination 09/27/2019, 10/19/2019, 06/11/2020   Pneumococcal Conjugate-13 12/30/2013   Pneumococcal Polysaccharide-23 11/26/2000   Td 12/18/2003    TDAP status: Due, Education has been provided regarding the importance of this vaccine. Advised may receive this vaccine at local pharmacy or Health Dept. Aware to provide a copy of the vaccination record if obtained from local pharmacy or Health Dept. Verbalized acceptance and understanding.  Flu Vaccine status: Up to date  Pneumococcal vaccine status: Up to date  Covid-19 vaccine status: Completed vaccines  Qualifies for Shingles Vaccine? Yes   Zostavax completed No   Shingrix Completed?: No.    Education has been  provided regarding the importance of this vaccine. Patient has been advised to call insurance company to determine out of pocket expense if they have not yet received this vaccine. Advised may also receive vaccine at local pharmacy or Health Dept. Verbalized acceptance and understanding.  Screening Tests Health Maintenance  Topic Date Due   Zoster Vaccines-  Shingrix (1 of 2) Never done   TETANUS/TDAP  12/17/2013   DEXA SCAN  08/08/2014   COVID-19 Vaccine (4 - Booster for Pfizer series) 10/12/2020   INFLUENZA VACCINE  04/04/2021   PNA vac Low Risk Adult  Completed   HPV VACCINES  Aged Out    Health Maintenance  Health Maintenance Due  Topic Date Due   Zoster Vaccines- Shingrix (1 of 2) Never done   TETANUS/TDAP  12/17/2013   DEXA SCAN  08/08/2014   COVID-19 Vaccine (4 - Booster for Pfizer series) 10/12/2020    Colorectal cancer screening: No longer required.   Mammogram status: No longer required due to age.  Bone Density status: Completed 08/09/2011.   Lung Cancer Screening: (Low Dose CT Chest recommended if Age 95-80 years, 30 pack-year currently smoking OR have quit w/in 15years.) does not qualify.   Lung Cancer Screening Referral: no  Additional Screening:  Hepatitis C Screening: does not qualify;   Vision Screening: Recommended annual ophthalmology exams for early detection of glaucoma and other disorders of the eye. Is the patient up to date with their annual eye exam?  Yes  Who is the provider or what is the name of the office in which the patient attends annual eye exams? [facility] If pt is not established with a provider, would they like to be referred to a provider to establish care? No .   Dental Screening: Recommended annual dental exams for proper oral hygiene  Community Resource Referral / Chronic Care Management: CRR required this visit?  No   CCM required this visit?  No      Plan:     I have personally reviewed and noted the following in  the patient's chart:   Medical and social history Use of alcohol, tobacco or illicit drugs  Current medications and supplements including opioid prescriptions.  Functional ability and status Nutritional status Physical activity Advanced directives List of other physicians Hospitalizations, surgeries, and ER visits in previous 12 months Vitals Screenings to include cognitive, depression, and falls Referrals and appointments  In addition, I have reviewed and discussed with patient certain preventive protocols, quality metrics, and best practice recommendations. A written personalized care plan for preventive services as well as general preventive health recommendations were provided to patient.     Kellie Simmering, LPN   03/14/6578   Nurse Notes: 6 CIT not administered. Patient having difficulty hearing me. Son had to answer most questions.

## 2021-02-16 ENCOUNTER — Ambulatory Visit: Payer: PPO

## 2021-02-17 ENCOUNTER — Encounter: Payer: Self-pay | Admitting: Gastroenterology

## 2021-02-17 ENCOUNTER — Ambulatory Visit: Payer: PPO | Admitting: Gastroenterology

## 2021-02-17 ENCOUNTER — Other Ambulatory Visit: Payer: Self-pay

## 2021-02-17 VITALS — BP 142/68 | HR 89 | Temp 98.0°F | Ht 67.0 in | Wt 194.6 lb

## 2021-02-17 DIAGNOSIS — K625 Hemorrhage of anus and rectum: Secondary | ICD-10-CM | POA: Diagnosis not present

## 2021-02-17 DIAGNOSIS — K5900 Constipation, unspecified: Secondary | ICD-10-CM | POA: Diagnosis not present

## 2021-02-17 NOTE — Progress Notes (Signed)
[name] MD, MRCP(U.K) [address]  [place], [place] [address]  Main: [phone]  Fax: [phone]   Primary Care Physician: Jon Billings, NP  Primary Gastroenterologist:  Dr. [name]   F/u :  Constipation and rectal bleeding   HPI: Mckenzie Gutierrez is a 87 y.o. female  Summary of history :   She was initially referred and seen on 05/03/2020 for rectal bleeding.  In June 2021 her hemoglobin was 12.8 g.  She has been on Eliquis for atrial fibrillation.  She is very hard of hearing.  The bleeding was painless and noticed blood in the toilet bowl.  She has suffered from constipation in the past and prior agents being used including Metamucil, Colace and MiraLAX.   Due to her age the relative risks of anesthesia it was decided the best option would be to perform an unsedated flexible sigmoidoscopy to evaluate the rectum and sigmoid colon to rule out any masslike lesion.  She was advised on conservative management for her internal hemorrhoids.   05/14/2020 flexible sigmoidoscopy: Grade 2 internal hemorrhoids were noted.  No other bleeding from any other site was noted.    Interval history      She called with an episode of rectal bleeding when she was having severe constipation on Linzess 145 mcg/day.  We added an extra dose of MiraLAX every day.  Since then she has had no issues of bleeding or constipation.  She was accompanied by her daughter today no complaints she is happy where things are.    Current Outpatient Medications  Medication Sig Dispense Refill   amLODipine (NORVASC) 5 MG tablet Take 1 tablet (5 mg total) by mouth daily. OFFICE VISIT NEEDED FOR ADDITIONAL REFILLS 90 tablet 0   apixaban (ELIQUIS) 2.5 MG TABS tablet Take 2.5 mg by mouth 2 (two) times daily.     beta carotene w/minerals (OCUVITE) tablet Take 1 tablet by mouth daily.     citalopram (CELEXA) 20 MG tablet Take 1 tablet (20 mg total) by mouth daily. 90 tablet 3   furosemide  (LASIX) 20 MG tablet TAKE ONE TABLET EVERY DAY 90 tablet 0   levothyroxine (SYNTHROID) 75 MCG tablet TAKE 1 TABLET EVERY DAY ON EMPTY STOMACHWITH A GLASS OF WATER AT LEAST 30-60 MINBEFORE BREAKFAST 90 tablet 3   linaclotide (LINZESS) 145 MCG CAPS capsule Take 1 capsule (145 mcg total) by mouth daily before breakfast. 90 capsule 1   losartan (COZAAR) 100 MG tablet TAKE ONE TABLET EVERY DAY 90 tablet 0   Multiple Vitamin (MULTIVITAMIN) tablet Take 1 tablet by mouth daily.     pantoprazole (PROTONIX) 20 MG tablet TAKE 1 TABLET BY MOUTH DAILY 90 tablet 0   albuterol (VENTOLIN HFA) 108 (90 Base) MCG/ACT inhaler Inhale 2 puffs into the lungs every 6 (six) hours as needed for wheezing or shortness of breath. (Patient not taking: Reported on 02/17/2021) 18 g 0   Biotin 5000 MCG CAPS Take by mouth daily. (Patient not taking: Reported on 02/17/2021)     hydrocortisone (ANUSOL-HC) 25 MG suppository Place 25 mg rectally 2 (two) times daily as needed. (Patient not taking: Reported on 02/17/2021)     No current facility-administered medications for this visit.    Allergies as of 02/17/2021 - Review Complete 02/17/2021  Allergen Reaction Noted   Celebrex [celecoxib] Other (See Comments) 01/11/2016   Codeine sulfate Other (See Comments) 07/12/2015   Hydrochlorothiazide  07/12/2015   Hytrin [terazosin]  07/12/2015  Plendil [felodipine] Diarrhea and Nausea Only 07/12/2015   Nitrofuran derivatives Swelling and Rash 07/12/2015   Nitrofurantoin Rash and Swelling 07/12/2015    ROS:  General: Negative for anorexia, weight loss, fever, chills, fatigue, weakness. ENT: Negative for hoarseness, difficulty swallowing , nasal congestion. CV: Negative for chest pain, angina, palpitations, dyspnea on exertion, peripheral edema.  Respiratory: Negative for dyspnea at rest, dyspnea on exertion, cough, sputum, wheezing.  GI: See history of present illness. GU:  Negative for dysuria, hematuria, urinary incontinence,  urinary frequency, nocturnal urination.  Endo: Negative for unusual weight change.    Physical Examination:   BP (!) 142/68   Pulse 89   Temp 98 F (36.7 C) (Oral)   Ht 5\' 7"  (1.702 m)   Wt 194 lb 9.6 oz (88.3 kg)   LMP  (LMP Unknown)   BMI 30.48 kg/m   General: Well-nourished, well-developed in no acute distress.  Eyes: No icterus. Conjunctivae pink. Mouth: Oropharyngeal mucosa moist and pink , no lesions erythema or exudate. Neuro: Alert and oriented x 3.  Grossly intact. Skin: Warm and dry, no jaundice.   Psych: Alert and cooperative, normal mood and affect.   Imaging Studies: No results found.  Assessment and Plan:   Mckenzie Gutierrez is a 87 y.o. y/o female  here to follow-up for rectal bleeding.  Flexible sigmoidoscopy showed internal hemorrhoids.  She has responded to a trial of Anusol, sitz bath and Linzess 145 mcg.  Presently she has decent bowel movements with a combination of MiraLAX and Linzess 145 mcg daily.  I have advised her to stay the course and if she has recurrence of constipation or rectal bleeding to give me a call.  As needed  Dr [name]  MD,MRCP [facility]) Follow up in as needed

## 2021-02-18 DIAGNOSIS — H353221 Exudative age-related macular degeneration, left eye, with active choroidal neovascularization: Secondary | ICD-10-CM | POA: Diagnosis not present

## 2021-02-18 DIAGNOSIS — H353211 Exudative age-related macular degeneration, right eye, with active choroidal neovascularization: Secondary | ICD-10-CM | POA: Diagnosis not present

## 2021-02-19 ENCOUNTER — Other Ambulatory Visit: Payer: Self-pay | Admitting: Gastroenterology

## 2021-02-21 DIAGNOSIS — R0600 Dyspnea, unspecified: Secondary | ICD-10-CM | POA: Diagnosis not present

## 2021-02-21 DIAGNOSIS — I480 Paroxysmal atrial fibrillation: Secondary | ICD-10-CM | POA: Diagnosis not present

## 2021-02-21 DIAGNOSIS — E782 Mixed hyperlipidemia: Secondary | ICD-10-CM | POA: Diagnosis not present

## 2021-02-21 DIAGNOSIS — K219 Gastro-esophageal reflux disease without esophagitis: Secondary | ICD-10-CM | POA: Diagnosis not present

## 2021-02-21 DIAGNOSIS — E669 Obesity, unspecified: Secondary | ICD-10-CM | POA: Diagnosis not present

## 2021-02-21 DIAGNOSIS — R011 Cardiac murmur, unspecified: Secondary | ICD-10-CM | POA: Diagnosis not present

## 2021-02-21 DIAGNOSIS — Z8673 Personal history of transient ischemic attack (TIA), and cerebral infarction without residual deficits: Secondary | ICD-10-CM | POA: Diagnosis not present

## 2021-02-21 DIAGNOSIS — I739 Peripheral vascular disease, unspecified: Secondary | ICD-10-CM | POA: Diagnosis not present

## 2021-02-21 DIAGNOSIS — R600 Localized edema: Secondary | ICD-10-CM | POA: Diagnosis not present

## 2021-02-21 DIAGNOSIS — I10 Essential (primary) hypertension: Secondary | ICD-10-CM | POA: Diagnosis not present

## 2021-02-21 DIAGNOSIS — R531 Weakness: Secondary | ICD-10-CM | POA: Diagnosis not present

## 2021-02-21 DIAGNOSIS — R0602 Shortness of breath: Secondary | ICD-10-CM | POA: Diagnosis not present

## 2021-02-26 ENCOUNTER — Other Ambulatory Visit: Payer: Self-pay | Admitting: Unknown Physician Specialty

## 2021-02-26 ENCOUNTER — Other Ambulatory Visit: Payer: Self-pay | Admitting: Nurse Practitioner

## 2021-02-26 NOTE — Telephone Encounter (Signed)
last RF 04/06/20 #90 3 RF

## 2021-02-26 NOTE — Telephone Encounter (Signed)
Requested Prescriptions  Pending Prescriptions Disp Refills  . amLODipine (NORVASC) 5 MG tablet [Pharmacy Med Name: AMLODIPINE BESYLATE 5 MG TAB] 30 tablet 0    Sig: TAKE 1 TABLET BY MOUTH DAILY **NEED APPOINTMENT BEFORE ADDITIONAL REFILLS**     Cardiovascular:  Calcium Channel Blockers Failed - 02/26/2021 10:51 AM      Failed - Last BP in normal range    BP Readings from Last 1 Encounters:  02/17/21 (!) 142/68         Passed - Valid encounter within last 6 months    Recent Outpatient Visits          2 months ago Fall, initial encounter   [employer], Lauren A, NP   6 months ago Paroxysmal atrial fibrillation ([place])   [place], [place] T, NP   10 months ago Major depression in complete remission [facility])   [place] Kathrine Haddock, NP   10 months ago Erroneous encounter - disregard   [facility] [place], [place]   11 months ago Abscess   [facility], [place]      Future Appointments            In 4 days Jon Billings, NP [facility], [facility]   In 28 months  [employer], [place]           . pantoprazole (PROTONIX) 20 MG tablet [Pharmacy Med Name: PANTOPRAZOLE SODIUM 20 MG DR TAB] 90 tablet 0    Sig: TAKE 1 TABLET BY MOUTH DAILY     Gastroenterology: Proton Pump Inhibitors Passed - 02/26/2021 10:51 AM      Passed - Valid encounter within last 12 months    Recent Outpatient Visits          2 months ago Fall, initial encounter   [employer], Lauren A, NP   6 months ago Paroxysmal atrial fibrillation ([place])   [place], Jolene T, NP   10 months ago Major depression in complete remission ([place])   [place] Kathrine Haddock, NP   10 months ago Erroneous encounter - disregard   [facility] [place], [place]   11 months ago [place],  [place], [place]      Future Appointments            In 4 days Jon Billings, NP [facility], [facility]   In 11 months  [employer], [facility]           . losartan (COZAAR) 100 MG tablet [Pharmacy Med Name: LOSARTAN POTASSIUM 100 MG TAB] 90 tablet     Sig: TAKE ONE TABLET EVERY DAY     Cardiovascular:  Angiotensin Receptor Blockers Failed - 02/26/2021 10:51 AM      Failed - Cr in normal range and within 180 days    Creatinine, Ser  Date Value Ref Range Status  08/12/2020 0.76 0.57 - 1.00 mg/dL Final         Failed - K in normal range and within 180 days    Potassium  Date Value Ref Range Status  08/12/2020 4.0 3.5 - 5.2 mmol/L Final         Failed - Last BP in normal range    BP Readings from Last 1 Encounters:  02/17/21 (!) 142/68         Passed - Patient is not pregnant  Passed - Valid encounter within last 6 months    Recent Outpatient Visits          2 months ago Fall, initial encounter   [place], NP   6 months ago Paroxysmal atrial fibrillation ([place])   [place], [place] T, NP   10 months ago Major depression in complete remission [facility])   [place] Kathrine Haddock, NP   10 months ago Erroneous encounter - disregard   [place], [place]   11 months ago Abscess   [facility], [place]      Future Appointments            In 4 days Jon Billings, NP [facility], [facility]   In 11 months  [employer], [place]

## 2021-02-26 NOTE — Telephone Encounter (Signed)
Requested medication (s) are due for refill today: yes  Requested medication (s) are on the active medication list: yes  Last refill:  12/03/20 #90  Future visit scheduled: yes  Notes to clinic:  overdue labs (Cr and K)   Requested Prescriptions  Pending Prescriptions Disp Refills   losartan (COZAAR) 100 MG tablet [Pharmacy Med Name: LOSARTAN POTASSIUM 100 MG TAB] 90 tablet     Sig: TAKE ONE TABLET EVERY DAY      Cardiovascular:  Angiotensin Receptor Blockers Failed - 02/26/2021 10:51 AM      Failed - Cr in normal range and within 180 days    Creatinine, Ser  Date Value Ref Range Status  08/12/2020 0.76 0.57 - 1.00 mg/dL Final          Failed - K in normal range and within 180 days    Potassium  Date Value Ref Range Status  08/12/2020 4.0 3.5 - 5.2 mmol/L Final          Failed - Last BP in normal range    BP Readings from Last 1 Encounters:  02/17/21 (!) 142/68          Passed - Patient is not pregnant      Passed - Valid encounter within last 6 months    Recent Outpatient Visits           2 months ago Fall, initial encounter   [employer], Lauren A, NP   6 months ago Paroxysmal atrial fibrillation ([place])   [place], [place] T, NP   10 months ago Major depression in complete remission ([place])   [place] Kathrine Haddock, NP   10 months ago Erroneous encounter - disregard   [facility] [place], [place]   11 months ago Abscess   [place], [place], [place]       Future Appointments             In 4 days Jon Billings, NP [employer], [place]   In 11 months  [employer], [facility]              Signed Prescriptions Disp Refills   amLODipine (NORVASC) 5 MG tablet 30 tablet 0    Sig: TAKE 1 TABLET BY MOUTH DAILY **NEED APPOINTMENT BEFORE ADDITIONAL REFILLS**      Cardiovascular:  Calcium Channel Blockers Failed - 02/26/2021 10:51  AM      Failed - Last BP in normal range    BP Readings from Last 1 Encounters:  02/17/21 (!) 142/68          Passed - Valid encounter within last 6 months    Recent Outpatient Visits           2 months ago Fall, initial encounter   [employer], Lauren A, NP   6 months ago Paroxysmal atrial fibrillation ([place])   [place], [place] T, NP   10 months ago Major depression in complete remission [facility])   [place] Kathrine Haddock, NP   10 months ago Erroneous encounter - disregard   [facility] [employer] [employer], [place]   11 months ago Abscess   [facility] [place], [place]       Future Appointments             In 4 days Jon Billings, NP [facility], [facility]   In 15 months  [facility], [facility]               pantoprazole (PROTONIX) 20 MG tablet 90 tablet 0    Sig: TAKE 1 TABLET BY MOUTH DAILY      Gastroenterology: Proton Pump Inhibitors Passed - 02/26/2021 10:51 AM      Passed - Valid encounter within last 12 months    Recent Outpatient Visits           2 months ago Fall, initial encounter   [place], NP   6 months ago Paroxysmal atrial fibrillation ([place])   [place], [place] T, NP   10 months ago Major depression in complete remission [facility])   [place] Kathrine Haddock, NP   10 months ago Erroneous encounter - disregard   [facility], [place]   11 months ago Abscess   [facility], [place]       Future Appointments             In 4 days Jon Billings, NP [facility], [place]   In 81 months  [employer], [place]

## 2021-02-28 DIAGNOSIS — R011 Cardiac murmur, unspecified: Secondary | ICD-10-CM | POA: Diagnosis not present

## 2021-02-28 DIAGNOSIS — R0602 Shortness of breath: Secondary | ICD-10-CM | POA: Diagnosis not present

## 2021-02-28 NOTE — Telephone Encounter (Signed)
Pt is scheduled 6/29

## 2021-03-02 ENCOUNTER — Ambulatory Visit: Payer: Self-pay | Admitting: Nurse Practitioner

## 2021-03-11 ENCOUNTER — Ambulatory Visit: Payer: Self-pay | Admitting: Nurse Practitioner

## 2021-03-15 ENCOUNTER — Ambulatory Visit (INDEPENDENT_AMBULATORY_CARE_PROVIDER_SITE_OTHER): Payer: PPO

## 2021-03-15 ENCOUNTER — Other Ambulatory Visit: Payer: Self-pay

## 2021-03-15 ENCOUNTER — Ambulatory Visit (INDEPENDENT_AMBULATORY_CARE_PROVIDER_SITE_OTHER): Payer: PPO | Admitting: Vascular Surgery

## 2021-03-15 VITALS — BP 131/58 | HR 64 | Ht 67.0 in | Wt 192.0 lb

## 2021-03-15 DIAGNOSIS — I6523 Occlusion and stenosis of bilateral carotid arteries: Secondary | ICD-10-CM

## 2021-03-15 DIAGNOSIS — I10 Essential (primary) hypertension: Secondary | ICD-10-CM | POA: Diagnosis not present

## 2021-03-15 DIAGNOSIS — I4891 Unspecified atrial fibrillation: Secondary | ICD-10-CM | POA: Diagnosis not present

## 2021-03-15 DIAGNOSIS — E785 Hyperlipidemia, unspecified: Secondary | ICD-10-CM

## 2021-03-15 NOTE — Assessment & Plan Note (Addendum)
lipid control important in reducing the progression of atherosclerotic disease.   

## 2021-03-15 NOTE — Assessment & Plan Note (Signed)
Duplex today demonstrates her right carotid endarterectomy site to be widely patent with mild 1 to 39% left ICA stenosis which is stable from previous studies.  It does not appear as if her carotid disease has progressed and this would not explain her recent episodes.  She could be having some intermittent atrial fibrillation with malperfusion or microembolic issues although that is not entirely clear.  She is already on 2.5 mg twice daily of Eliquis.  I talked to her and her son about doing an MRI or another study if her confusion worsens but for now were going to watch and wait and see how she does.  Repeat her carotid duplex in 1 year.

## 2021-03-15 NOTE — Assessment & Plan Note (Signed)
On 2.5 mg of Eliquis bid.  Could be the cause of her confusion episodes.

## 2021-03-15 NOTE — Progress Notes (Signed)
MRN : [id number]  Mckenzie Gutierrez is a 87 y.o. (1922-05-11) female who presents with chief complaint of  Chief Complaint  Patient presents with   Follow-up    1 yr carotid FU  .  History of Present Illness: Patient returns today in follow up of her carotid disease.  She is status post right carotid endarterectomy many years ago.  Her son accompanies her today.  Several weeks to months ago she had an episode of significant slurred speech but this resolved.  She has had a couple of episodes of somewhat significant confusion in the past week although she is quite clear today.  These episodes revolved around waking up and thinking that it was a different day and being confused about the events of the previous day.  No arm or leg weakness or numbness.  No speech or swallowing difficulty recently.  No facial droop.  No visual symptoms.  Carotid duplex today reveals a widely patent right carotid endarterectomy and mild 1 to 39% left ICA stenosis.  Current Outpatient Medications  Medication Sig Dispense Refill   amLODipine (NORVASC) 5 MG tablet TAKE 1 TABLET BY MOUTH DAILY **NEED APPOINTMENT BEFORE ADDITIONAL REFILLS** 30 tablet 0   apixaban (ELIQUIS) 2.5 MG TABS tablet Take 2.5 mg by mouth 2 (two) times daily.     beta carotene w/minerals (OCUVITE) tablet Take 1 tablet by mouth daily.     citalopram (CELEXA) 20 MG tablet Take 1 tablet (20 mg total) by mouth daily. 90 tablet 3   furosemide (LASIX) 20 MG tablet TAKE ONE TABLET EVERY DAY 90 tablet 0   levothyroxine (SYNTHROID) 75 MCG tablet TAKE 1 TABLET EVERY DAY ON EMPTY STOMACHWITH A GLASS OF WATER AT LEAST 30-60 MINBEFORE BREAKFAST 90 tablet 3   linaclotide (LINZESS) 145 MCG CAPS capsule Take 1 capsule (145 mcg total) by mouth daily before breakfast. 90 capsule 1   losartan (COZAAR) 100 MG tablet TAKE ONE TABLET EVERY DAY 90 tablet 1   Multiple Vitamin (MULTIVITAMIN) tablet Take 1 tablet by mouth daily.     pantoprazole (PROTONIX) 20 MG tablet  TAKE 1 TABLET BY MOUTH DAILY 90 tablet 0   STOOL SOFTENER 100 MG capsule TAKE 1 CAPSULE BY MOUTH TWICE DAILY     No current facility-administered medications for this visit.    Past Medical History:  Diagnosis Date   Anxiety    Atrial premature beats    Bradycardia    Carotid stenosis    Depression    GERD (gastroesophageal reflux disease)    Hyperlipidemia    Hypertension    Low back pain    Osteoporosis    Thyroid disease    Urinary frequency     Past Surgical History:  Procedure Laterality Date   CARPAL TUNNEL RELEASE     FLEXIBLE SIGMOIDOSCOPY N/A 05/13/2020   Procedure: FLEXIBLE SIGMOIDOSCOPY;  Surgeon: [name], MD;  Location[facility];  Service: Gastroenterology;  Laterality: N/A;  Unsedated   WRIST SURGERY       Social History   Tobacco Use   Smoking status: Never   Smokeless tobacco: Never  Vaping Use   Vaping Use: Never used  Substance Use Topics   Alcohol use: No    Alcohol/week: 0.0 standard drinks   Drug use: No      Family History  Problem Relation Age of Onset   Heart disease Mother    Stroke Father    Diabetes Father    Stroke Sister  Allergies  Allergen Reactions   Celebrex [Celecoxib] Other (See Comments)    Hard stools   Codeine Sulfate Other (See Comments)   Hydrochlorothiazide    Hytrin [Terazosin]    Plendil [Felodipine] Diarrhea and Nausea Only   Nitrofuran Derivatives Swelling and Rash   Nitrofurantoin Rash and Swelling     REVIEW OF SYSTEMS (Negative unless checked)  Constitutional: [] Weight loss  [] Fever  [] Chills Cardiac: [] Chest pain   [] Chest pressure   [] Palpitations   [] Shortness of breath when laying flat   [] Shortness of breath at rest   [] Shortness of breath with exertion. Vascular:  [] Pain in legs with walking   [] Pain in legs at rest   [] Pain in legs when laying flat   [] Claudication   [] Pain in feet when walking  [] Pain in feet at rest  [] Pain in feet when laying flat   [] History of DVT    [] Phlebitis   [] Swelling in legs   [] Varicose veins   [] Non-healing ulcers Pulmonary:   [] Uses home oxygen   [] Productive cough   [] Hemoptysis   [] Wheeze  [] COPD   [] Asthma Neurologic:  [] Dizziness  [] Blackouts   [] Seizures   [] History of stroke   [] History of TIA  [] Aphasia   [] Temporary blindness   [] Dysphagia   [] Weakness or numbness in arms   [] Weakness or numbness in legs positive for recent episodes of confusion and episode of slurred speech a few months ago. Musculoskeletal:  [x] Arthritis   [] Joint swelling   [] Joint pain   [] Low back pain Hematologic:  [] Easy bruising  [] Easy bleeding   [] Hypercoagulable state   [] Anemic   Gastrointestinal:  [] Blood in stool   [] Vomiting blood  [] Gastroesophageal reflux/heartburn   [] Abdominal pain Genitourinary:  [] Chronic kidney disease   [] Difficult urination  [] Frequent urination  [] Burning with urination   [] Hematuria Skin:  [] Rashes   [] Ulcers   [] Wounds Psychological:  [] History of anxiety   []  History of major depression.  Physical Examination  BP (!) 131/58   Pulse 64   Ht 5\' 7"  (1.702 m)   Wt 192 lb (87.1 kg)   LMP  (LMP Unknown)   BMI 30.07 kg/m  Gen:  WD/WN, NAD. Appears younger than stated age. Head: [place]/AT, No temporalis wasting. Ear/Nose/Throat: Hearing diminished, nares w/o erythema or drainage Eyes: Conjunctiva clear. Sclera non-icteric Neck: Supple.  Trachea midline Pulmonary:  Good air movement, no use of accessory muscles.  Cardiac: RRR, no JVD Vascular:  Vessel Right Left  Radial Palpable Palpable           Musculoskeletal: M/S 5/5 throughout.  No deformity or atrophy. No edema. Neurologic: Sensation grossly intact in extremities.  Symmetrical.  Speech is fluent.  Psychiatric: Judgment intact, Mood & affect appropriate for pt's clinical situation. Dermatologic: No rashes or ulcers noted.  No cellulitis or open wounds.      Labs No results found for this or any previous visit (from the past 2160  hour(s)).  Radiology No results found.  Assessment/Plan  Hypertension blood pressure control important in reducing the progression of atherosclerotic disease. On appropriate oral medications.   Atrial fibrillation (HCC) On 2.5 mg of Eliquis bid.  Could be the cause of her confusion episodes.  HLD (hyperlipidemia) lipid control important in reducing the progression of atherosclerotic disease.    Carotid stenosis Duplex today demonstrates her right carotid endarterectomy site to be widely patent with mild 1 to 39% left ICA stenosis which is stable from previous studies.  It does not appear as  if her carotid disease has progressed and this would not explain her recent episodes.  She could be having some intermittent atrial fibrillation with malperfusion or microembolic issues although that is not entirely clear.  She is already on 2.5 mg twice daily of Eliquis.  I talked to her and her son about doing an MRI or another study if her confusion worsens but for now were going to watch and wait and see how she does.  Repeat her carotid duplex in 1 year.    Leotis Pain, MD  03/15/2021 4:21 PM    This note was created with Dragon medical transcription system.  Any errors from dictation are purely unintentional

## 2021-03-15 NOTE — Assessment & Plan Note (Signed)
blood pressure control important in reducing the progression of atherosclerotic disease. On appropriate oral medications.  

## 2021-03-18 ENCOUNTER — Other Ambulatory Visit: Payer: Self-pay

## 2021-03-18 ENCOUNTER — Encounter: Payer: Self-pay | Admitting: Nurse Practitioner

## 2021-03-18 ENCOUNTER — Ambulatory Visit (INDEPENDENT_AMBULATORY_CARE_PROVIDER_SITE_OTHER): Payer: PPO | Admitting: Nurse Practitioner

## 2021-03-18 VITALS — BP 134/74 | HR 64 | Wt 192.6 lb

## 2021-03-18 DIAGNOSIS — E039 Hypothyroidism, unspecified: Secondary | ICD-10-CM

## 2021-03-18 DIAGNOSIS — F325 Major depressive disorder, single episode, in full remission: Secondary | ICD-10-CM

## 2021-03-18 DIAGNOSIS — H353221 Exudative age-related macular degeneration, left eye, with active choroidal neovascularization: Secondary | ICD-10-CM | POA: Diagnosis not present

## 2021-03-18 DIAGNOSIS — D649 Anemia, unspecified: Secondary | ICD-10-CM | POA: Diagnosis not present

## 2021-03-18 DIAGNOSIS — I10 Essential (primary) hypertension: Secondary | ICD-10-CM

## 2021-03-18 DIAGNOSIS — E785 Hyperlipidemia, unspecified: Secondary | ICD-10-CM

## 2021-03-18 DIAGNOSIS — I4891 Unspecified atrial fibrillation: Secondary | ICD-10-CM

## 2021-03-18 NOTE — Assessment & Plan Note (Signed)
Chronic.  Controlled.  Continue with current medication regimen.  Labs ordered today.  Return to clinic in 6 months for reevaluation.  Call sooner if concerns arise.  ? ?

## 2021-03-18 NOTE — Assessment & Plan Note (Signed)
Chronic.  Controlled.  Continue with current medication regimen. Continue to follow up with Cardiology.  Labs ordered today.  Return to clinic in 6 months for reevaluation.  Call sooner if concerns arise.   

## 2021-03-18 NOTE — Assessment & Plan Note (Signed)
Continue to follow up with Dr. Wallace Going.

## 2021-03-18 NOTE — Progress Notes (Signed)
BP 134/74   Pulse 64   Wt 192 lb 9.6 oz (87.4 kg)   LMP  (LMP Unknown)   SpO2 99%   BMI 30.17 kg/m    Subjective:    Patient ID: Mckenzie Gutierrez, female    DOB: December 24, 1921, 87 y.o.   MRN: [id number]  HPI: Mckenzie Gutierrez is a 87 y.o. female  Chief Complaint  Patient presents with   Hypertension   Hypothyroidism   Depression   Atrial Fibrillation   Memory Loss    Patient daughter states patient is very confused and has trouble with memory. Patient daughter states she had two episodes of slurred speech, one episode was 2 months ago and one was on Monday.    AFIB Patient's daughter states that her AFIB is well controlled.  She continues to follow up with Cardiology. Had an updated ECHO done and waiting on results.   HYPERTENSION Hypertension status: controlled  Satisfied with current treatment? yes Duration of hypertension: years BP monitoring frequency:  rarely BP range: 137/52 BP medication side effects:  no Medication compliance: excellent compliance Previous BP meds:amlodipine, losartan and lasix. Aspirin: no Recurrent headaches: no Visual changes: no Palpitations: no Dyspnea: yes Chest pain: no Lower extremity edema: yes Dizzy/lightheaded: no  HYPOTHYROIDISM Thyroid control status:controlled Satisfied with current treatment? no Medication side effects: no Medication compliance: excellent compliance Etiology of hypothyroidism:  Recent dose adjustment:no Fatigue: no Cold intolerance: no Heat intolerance: no Weight gain: no Weight loss: no Constipation: no Diarrhea/loose stools: no Palpitations: no Lower extremity edema: no Anxiety/depressed mood: no  DEPRESSION Well controlled.  Related to fatigue per the patient.    [place] Office Visit from 03/18/2021 in [place]  PHQ-9 Total Score 2       SLURRED SPEECH Patient has seen vascular specialist who states it was likely a TIA. Can do an MRI if the patient wants.  Family  elected not to at this time.   Relevant past medical, surgical, family and social history reviewed and updated as indicated. Interim medical history since our last visit reviewed. Allergies and medications reviewed and updated.  Review of Systems  Constitutional:  Positive for fatigue. Negative for fever and unexpected weight change.  Eyes:  Negative for visual disturbance.  Respiratory:  Negative for cough, chest tightness and shortness of breath.   Cardiovascular:  Negative for chest pain, palpitations and leg swelling.  Endocrine: Negative for cold intolerance.  Neurological:  Negative for dizziness and headaches.  Psychiatric/Behavioral:  Positive for dysphoric mood.    Per HPI unless specifically indicated above     Objective:    BP 134/74   Pulse 64   Wt 192 lb 9.6 oz (87.4 kg)   LMP  (LMP Unknown)   SpO2 99%   BMI 30.17 kg/m   Wt Readings from Last 3 Encounters:  03/18/21 192 lb 9.6 oz (87.4 kg)  03/15/21 192 lb (87.1 kg)  02/17/21 194 lb 9.6 oz (88.3 kg)    Physical Exam Vitals and nursing note reviewed.  Constitutional:      General: She is not in acute distress.    Appearance: Normal appearance. She is normal weight. She is not ill-appearing, toxic-appearing or diaphoretic.  HENT:     Head: Normocephalic.     Right Ear: External ear normal.     Left Ear: External ear normal.     Nose: Nose normal.     Mouth/Throat:     Mouth: Mucous membranes are moist.  Pharynx: Oropharynx is clear.  Eyes:     General:        Right eye: No discharge.        Left eye: No discharge.     Extraocular Movements: Extraocular movements intact.     Conjunctiva/sclera: Conjunctivae normal.     Pupils: Pupils are equal, round, and reactive to light.  Cardiovascular:     Rate and Rhythm: Normal rate and regular rhythm.     Heart sounds: No murmur heard. Pulmonary:     Effort: Pulmonary effort is normal. No respiratory distress.     Breath sounds: Normal breath sounds. No  wheezing or rales.  Musculoskeletal:     Cervical back: Normal range of motion and neck supple.  Skin:    General: Skin is warm and dry.     Capillary Refill: Capillary refill takes less than 2 seconds.  Neurological:     General: No focal deficit present.     Mental Status: She is alert and oriented to person, place, and time. Mental status is at baseline.  Psychiatric:        Mood and Affect: Mood normal.        Behavior: Behavior normal.        Thought Content: Thought content normal.        Judgment: Judgment normal.    Results for orders placed or performed in visit on 11/15/20  CBC  Result Value Ref Range   WBC 7.5 3.4 - 10.8 x10E3/uL   RBC 3.49 (L) 3.77 - 5.28 x10E6/uL   Hemoglobin 11.8 11.1 - 15.9 g/dL   Hematocrit 34.9 34.0 - 46.6 %   MCV 100 (H) 79 - 97 fL   MCH 33.8 (H) 26.6 - 33.0 pg   MCHC 33.8 31.5 - 35.7 g/dL   RDW 15.5 (H) 11.7 - 15.4 %   Platelets 171 150 - 450 x10E3/uL   NRBC 1 (H) 0 - 0 %      Assessment & Plan:   Problem List Items Addressed This Visit       Cardiovascular and Mediastinum   Hypertension - Primary    Chronic.  Controlled.  Continue with current medication regimen.  Labs ordered today.  Return to clinic in 6 months for reevaluation.  Call sooner if concerns arise.         Relevant Orders   Comp Met (CMET)   Atrial fibrillation (HCC)    Chronic.  Controlled.  Continue with current medication regimen.  Continue to follow up with Cardiology.  Labs ordered today.  Return to clinic in 6 months for reevaluation.  Call sooner if concerns arise.         Relevant Orders   CBC w/Diff   Exudative age-related macular degeneration of left eye with active choroidal neovascularization ([place])    Continue to follow up with Dr. Wallace Going.         Endocrine   Hypothyroid    Chronic.  Controlled.  Continue with current medication regimen.  Labs ordered today.  Return to clinic in 6 months for reevaluation.  Call sooner if concerns arise.          Relevant Orders   TSH   T4, free     Other   HLD (hyperlipidemia)    Chronic.  Controlled.  Continue with current medication regimen.  Labs ordered today.  Return to clinic in 6 months for reevaluation.  Call sooner if concerns arise.  Relevant Orders   Lipid Profile   Major depression in complete remission (HCC)    Chronic.  Controlled.  Continue with current medication regimen.  Labs ordered today.  Return to clinic in 6 months for reevaluation.  Call sooner if concerns arise.           Follow up plan: Return in about 6 months (around 09/18/2021) for Physical and Fasting labs.

## 2021-03-19 LAB — CBC WITH DIFFERENTIAL/PLATELET
Basophils Absolute: 0.1 10*3/uL (ref 0.0–0.2)
Basos: 2 %
EOS (ABSOLUTE): 0.1 10*3/uL (ref 0.0–0.4)
Eos: 1 %
Hematocrit: 32 % — ABNORMAL LOW (ref 34.0–46.6)
Hemoglobin: 9.9 g/dL — ABNORMAL LOW (ref 11.1–15.9)
Immature Grans (Abs): 0 10*3/uL (ref 0.0–0.1)
Immature Granulocytes: 1 %
Lymphocytes Absolute: 2.8 10*3/uL (ref 0.7–3.1)
Lymphs: 35 %
MCH: 28.3 pg (ref 26.6–33.0)
MCHC: 30.9 g/dL — ABNORMAL LOW (ref 31.5–35.7)
MCV: 91 fL (ref 79–97)
Monocytes Absolute: 0.7 10*3/uL (ref 0.1–0.9)
Monocytes: 9 %
Neutrophils Absolute: 4.2 10*3/uL (ref 1.4–7.0)
Neutrophils: 52 %
Platelets: 251 10*3/uL (ref 150–450)
RBC: 3.5 x10E6/uL — ABNORMAL LOW (ref 3.77–5.28)
RDW: 15.3 % (ref 11.7–15.4)
WBC: 8 10*3/uL (ref 3.4–10.8)

## 2021-03-19 LAB — COMPREHENSIVE METABOLIC PANEL
ALT: 13 IU/L (ref 0–32)
AST: 21 IU/L (ref 0–40)
Albumin/Globulin Ratio: 2 (ref 1.2–2.2)
Albumin: 4.5 g/dL (ref 3.5–4.6)
Alkaline Phosphatase: 76 IU/L (ref 44–121)
BUN/Creatinine Ratio: 13 (ref 12–28)
BUN: 13 mg/dL (ref 10–36)
Bilirubin Total: 0.4 mg/dL (ref 0.0–1.2)
CO2: 25 mmol/L (ref 20–29)
Calcium: 9.6 mg/dL (ref 8.7–10.3)
Chloride: 99 mmol/L (ref 96–106)
Creatinine, Ser: 1.02 mg/dL — ABNORMAL HIGH (ref 0.57–1.00)
Globulin, Total: 2.3 g/dL (ref 1.5–4.5)
Glucose: 103 mg/dL — ABNORMAL HIGH (ref 65–99)
Potassium: 4 mmol/L (ref 3.5–5.2)
Sodium: 139 mmol/L (ref 134–144)
Total Protein: 6.8 g/dL (ref 6.0–8.5)
eGFR: 50 mL/min/{1.73_m2} — ABNORMAL LOW (ref >59)

## 2021-03-19 LAB — LIPID PANEL
Chol/HDL Ratio: 2.3 ratio (ref 0.0–4.4)
Cholesterol, Total: 132 mg/dL (ref 100–199)
HDL: 58 mg/dL (ref >39)
LDL Chol Calc (NIH): 60 mg/dL (ref 0–99)
Triglycerides: 65 mg/dL (ref 0–149)
VLDL Cholesterol Cal: 14 mg/dL (ref 5–40)

## 2021-03-19 LAB — TSH: TSH: 2.51 u[IU]/mL (ref 0.450–4.500)

## 2021-03-19 LAB — T4, FREE: Free T4: 1.12 ng/dL (ref 0.82–1.77)

## 2021-03-21 NOTE — Progress Notes (Signed)
Please let patient know that her blood work shows that her kidney function declined slightly.  I would like to recheck this later this week.  She should make sure she is well hydrated when she comes in for the lab visit.  Also, her blood work shows she is having some anemia.  I recommend she speak with Dr. Vicente Males about this due to her recent rectal bleeding.  I added blood work to see if we can determine the cause of the anemia.  Please let me know if they have any questions.

## 2021-03-21 NOTE — Addendum Note (Signed)
Addended by: Jon Billings on: 03/21/2021 08:57 AM   Modules accepted: Orders

## 2021-03-23 ENCOUNTER — Other Ambulatory Visit: Payer: Self-pay

## 2021-03-23 ENCOUNTER — Other Ambulatory Visit: Payer: PPO

## 2021-03-23 DIAGNOSIS — D649 Anemia, unspecified: Secondary | ICD-10-CM

## 2021-03-24 LAB — ANEMIA PROFILE B
Basophils Absolute: 0.1 10*3/uL (ref 0.0–0.2)
Basos: 1 %
EOS (ABSOLUTE): 0.1 10*3/uL (ref 0.0–0.4)
Eos: 1 %
Ferritin: 17 ng/mL (ref 15–150)
Folate: >20 ng/mL (ref >3.0)
Hematocrit: 31.5 % — ABNORMAL LOW (ref 34.0–46.6)
Hemoglobin: 9.8 g/dL — ABNORMAL LOW (ref 11.1–15.9)
Immature Grans (Abs): 0.1 10*3/uL (ref 0.0–0.1)
Immature Granulocytes: 1 %
Iron Saturation: 9 % — CL (ref 15–55)
Iron: 32 ug/dL (ref 27–139)
Lymphocytes Absolute: 2.6 10*3/uL (ref 0.7–3.1)
Lymphs: 29 %
MCH: 27.6 pg (ref 26.6–33.0)
MCHC: 31.1 g/dL — ABNORMAL LOW (ref 31.5–35.7)
MCV: 89 fL (ref 79–97)
Monocytes Absolute: 0.7 10*3/uL (ref 0.1–0.9)
Monocytes: 8 %
Neutrophils Absolute: 5.3 10*3/uL (ref 1.4–7.0)
Neutrophils: 60 %
Platelets: 253 10*3/uL (ref 150–450)
RBC: 3.55 x10E6/uL — ABNORMAL LOW (ref 3.77–5.28)
RDW: 15.5 % — ABNORMAL HIGH (ref 11.7–15.4)
Retic Ct Pct: 1.6 % (ref 0.6–2.6)
Total Iron Binding Capacity: 352 ug/dL (ref 250–450)
UIBC: 320 ug/dL (ref 118–369)
Vitamin B-12: 973 pg/mL (ref 232–1245)
WBC: 8.8 10*3/uL (ref 3.4–10.8)

## 2021-03-24 LAB — COMPREHENSIVE METABOLIC PANEL
ALT: 11 IU/L (ref 0–32)
AST: 23 IU/L (ref 0–40)
Albumin/Globulin Ratio: 1.8 (ref 1.2–2.2)
Albumin: 4.3 g/dL (ref 3.5–4.6)
Alkaline Phosphatase: 76 IU/L (ref 44–121)
BUN/Creatinine Ratio: 17 (ref 12–28)
BUN: 15 mg/dL (ref 10–36)
Bilirubin Total: 0.3 mg/dL (ref 0.0–1.2)
CO2: 24 mmol/L (ref 20–29)
Calcium: 9.4 mg/dL (ref 8.7–10.3)
Chloride: 99 mmol/L (ref 96–106)
Creatinine, Ser: 0.88 mg/dL (ref 0.57–1.00)
Globulin, Total: 2.4 g/dL (ref 1.5–4.5)
Glucose: 110 mg/dL — ABNORMAL HIGH (ref 65–99)
Potassium: 4.2 mmol/L (ref 3.5–5.2)
Sodium: 139 mmol/L (ref 134–144)
Total Protein: 6.7 g/dL (ref 6.0–8.5)
eGFR: 60 mL/min/{1.73_m2} (ref >59)

## 2021-03-24 NOTE — Progress Notes (Signed)
Lab work shows that patient has some iron deficiency anemia.  I recommend she start Ferrous Sulfate 65mg  over the counter once daily.  This can cause patient to have upset stomach, I reocmmend she take it with water. Also, it can cause some constipation, I recommend she use colace if needed to help with bowel movements. We should follow up in 2 months to recheck lab work. Please make patient an appointment.

## 2021-03-25 DIAGNOSIS — R2990 Unspecified symptoms and signs involving the nervous system: Secondary | ICD-10-CM | POA: Diagnosis not present

## 2021-04-05 DIAGNOSIS — H353221 Exudative age-related macular degeneration, left eye, with active choroidal neovascularization: Secondary | ICD-10-CM | POA: Diagnosis not present

## 2021-04-05 DIAGNOSIS — H353211 Exudative age-related macular degeneration, right eye, with active choroidal neovascularization: Secondary | ICD-10-CM | POA: Diagnosis not present

## 2021-04-08 NOTE — Telephone Encounter (Signed)
What are you asking? It seems they have been conversing back and forth with [name].

## 2021-04-11 DIAGNOSIS — R410 Disorientation, unspecified: Secondary | ICD-10-CM

## 2021-04-13 DIAGNOSIS — H6123 Impacted cerumen, bilateral: Secondary | ICD-10-CM | POA: Diagnosis not present

## 2021-04-13 DIAGNOSIS — H903 Sensorineural hearing loss, bilateral: Secondary | ICD-10-CM | POA: Diagnosis not present

## 2021-04-14 ENCOUNTER — Other Ambulatory Visit: Payer: PPO

## 2021-04-14 ENCOUNTER — Other Ambulatory Visit: Payer: Self-pay

## 2021-04-14 DIAGNOSIS — Z8679 Personal history of other diseases of the circulatory system: Secondary | ICD-10-CM | POA: Diagnosis not present

## 2021-04-14 DIAGNOSIS — R8290 Unspecified abnormal findings in urine: Secondary | ICD-10-CM | POA: Diagnosis not present

## 2021-04-14 DIAGNOSIS — R410 Disorientation, unspecified: Secondary | ICD-10-CM | POA: Diagnosis not present

## 2021-04-14 DIAGNOSIS — R4781 Slurred speech: Secondary | ICD-10-CM | POA: Diagnosis not present

## 2021-04-14 DIAGNOSIS — F4489 Other dissociative and conversion disorders: Secondary | ICD-10-CM | POA: Diagnosis not present

## 2021-04-14 LAB — URINALYSIS, ROUTINE W REFLEX MICROSCOPIC
Bilirubin, UA: NEGATIVE
Glucose, UA: NEGATIVE
Ketones, UA: NEGATIVE
Nitrite, UA: NEGATIVE
Protein,UA: NEGATIVE
Specific Gravity, UA: 1.015 (ref 1.005–1.030)
Urobilinogen, Ur: 0.2 mg/dL (ref 0.2–1.0)
pH, UA: 7 (ref 5.0–7.5)

## 2021-04-14 LAB — MICROSCOPIC EXAMINATION

## 2021-04-14 NOTE — Progress Notes (Signed)
Patient's urinalysis is abnormal with some bacteria. Will send for culture to ensure no bacteria grows.  Will treat based on culture results.

## 2021-04-15 ENCOUNTER — Other Ambulatory Visit: Payer: Self-pay | Admitting: Physician Assistant

## 2021-04-15 DIAGNOSIS — F4489 Other dissociative and conversion disorders: Secondary | ICD-10-CM

## 2021-04-15 DIAGNOSIS — R4781 Slurred speech: Secondary | ICD-10-CM

## 2021-04-15 DIAGNOSIS — Z8679 Personal history of other diseases of the circulatory system: Secondary | ICD-10-CM

## 2021-04-16 LAB — URINE CULTURE

## 2021-04-18 ENCOUNTER — Other Ambulatory Visit: Payer: Self-pay | Admitting: Nurse Practitioner

## 2021-04-18 MED ORDER — AMOXICILLIN-POT CLAVULANATE 875-125 MG PO TABS: 1.0000 | ORAL_TABLET | Freq: Two times a day (BID) | ORAL | 0 refills | Status: AC

## 2021-04-18 NOTE — Progress Notes (Signed)
Contacted via MyChart   Good morning, your urine returned showing some growth >100,000.  I am Mekenzie Modeste and covering for [name] this week, I do not see where she sent in antibiotic, so I will send in Augmentin which this is susceptible to and recommend you schedule follow-up and urine recheck for a couple weeks please.  Any questions?

## 2021-04-20 ENCOUNTER — Other Ambulatory Visit: Payer: Self-pay | Admitting: Gastroenterology

## 2021-05-02 ENCOUNTER — Ambulatory Visit
Admission: RE | Admit: 2021-05-02 | Discharge: 2021-05-02 | Disposition: A | Discharge status: Home / Self Care | Payer: PPO | Source: Ambulatory Visit | Attending: Physician Assistant | Admitting: Physician Assistant

## 2021-05-02 ENCOUNTER — Other Ambulatory Visit: Payer: Self-pay

## 2021-05-02 DIAGNOSIS — Z8679 Personal history of other diseases of the circulatory system: Secondary | ICD-10-CM | POA: Diagnosis not present

## 2021-05-02 DIAGNOSIS — F4489 Other dissociative and conversion disorders: Secondary | ICD-10-CM | POA: Diagnosis not present

## 2021-05-02 DIAGNOSIS — R4781 Slurred speech: Secondary | ICD-10-CM | POA: Insufficient documentation

## 2021-05-02 DIAGNOSIS — R410 Disorientation, unspecified: Secondary | ICD-10-CM | POA: Diagnosis not present

## 2021-05-11 ENCOUNTER — Ambulatory Visit (INDEPENDENT_AMBULATORY_CARE_PROVIDER_SITE_OTHER): Payer: PPO | Admitting: Nurse Practitioner

## 2021-05-11 ENCOUNTER — Other Ambulatory Visit: Payer: Self-pay

## 2021-05-11 ENCOUNTER — Encounter: Payer: Self-pay | Admitting: Nurse Practitioner

## 2021-05-11 VITALS — BP 133/69 | HR 58 | Temp 97.9°F | Wt 193.2 lb

## 2021-05-11 DIAGNOSIS — W19XXXA Unspecified fall, initial encounter: Secondary | ICD-10-CM | POA: Diagnosis not present

## 2021-05-11 DIAGNOSIS — D649 Anemia, unspecified: Secondary | ICD-10-CM

## 2021-05-11 DIAGNOSIS — R350 Frequency of micturition: Secondary | ICD-10-CM

## 2021-05-11 LAB — URINALYSIS, ROUTINE W REFLEX MICROSCOPIC
Bilirubin, UA: NEGATIVE
Glucose, UA: NEGATIVE
Ketones, UA: NEGATIVE
Leukocytes,UA: NEGATIVE
Nitrite, UA: NEGATIVE
Protein,UA: NEGATIVE
Specific Gravity, UA: 1.015 (ref 1.005–1.030)
Urobilinogen, Ur: 0.2 mg/dL (ref 0.2–1.0)
pH, UA: 7 (ref 5.0–7.5)

## 2021-05-11 LAB — MICROSCOPIC EXAMINATION: WBC, UA: NONE SEEN /hpf (ref 0–5)

## 2021-05-11 MED ORDER — PREDNISONE 10 MG PO TABS: 10.0000 mg | ORAL_TABLET | Freq: Every day | ORAL | 0 refills | Status: DC

## 2021-05-11 NOTE — Assessment & Plan Note (Signed)
Recommend completing course of prednisone to help with intercostal pain. Recommend Ibuprofen TID for the next 3 days to help with pain and inflammation.  Follow up if symptoms return or do not improve.

## 2021-05-11 NOTE — Progress Notes (Signed)
BP 133/69   Pulse (!) 58   Temp 97.9 F (36.6 C) (Oral)   Wt 193 lb 3.2 oz (87.6 kg)   LMP  (LMP Unknown)   SpO2 97%   BMI 30.26 kg/m    Subjective:    Patient ID: Mckenzie Gutierrez, female    DOB: 04-21-1922, 87 y.o.   MRN: [id number]  HPI: Mckenzie Gutierrez is a 87 y.o. female  Chief Complaint  Patient presents with   Recurrent UTI    Patient states she is for a follow up on the UTI. Patient denies having any concerns at today's visit.    Anemia    Patient states she is here to follow up on the iron medication. Patient states she is currently taking medication for her Anemia from recent lab results.    Flank Pain    Patient states a week or two ago, she fell against a metal rail and she is having some pain on her right side. Patient denies having any bruising. Patient state to took a deep breathe it hurts.    RECURRENT UTI Patient has had a recent UTI.  We are going to recheck her urine at the visit today and make sure the bacteria has cleared.  Denies urinary frequency, urgency, dysuria.  ANEMIA Anemia status: controlled Etiology of anemia: Duration of anemia treatment:  Compliance with treatment: excellent compliance Iron supplementation side effects: no Severity of anemia: mild Fatigue: yes Decreased exercise tolerance: yes  Dyspnea on exertion: yes Palpitations: no Bleeding: no Pica: no  Patient learned over the railing to get her paper and lost her balance a little bit and has had sore ribs since then. Denies bruising. But states it catches some when she takes a deep breath.  Relevant past medical, surgical, family and social history reviewed and updated as indicated. Interim medical history since our last visit reviewed. Allergies and medications reviewed and updated.  Review of Systems  Constitutional:  Positive for fatigue.  Respiratory:  Positive for shortness of breath.   Cardiovascular:  Negative for palpitations.  Gastrointestinal:        Denies  bleeding  Genitourinary:  Negative for dysuria, frequency and urgency.   Per HPI unless specifically indicated above     Objective:    BP 133/69   Pulse (!) 58   Temp 97.9 F (36.6 C) (Oral)   Wt 193 lb 3.2 oz (87.6 kg)   LMP  (LMP Unknown)   SpO2 97%   BMI 30.26 kg/m   Wt Readings from Last 3 Encounters:  05/11/21 193 lb 3.2 oz (87.6 kg)  03/18/21 192 lb 9.6 oz (87.4 kg)  03/15/21 192 lb (87.1 kg)    Physical Exam Vitals and nursing note reviewed.  Constitutional:      General: She is not in acute distress.    Appearance: Normal appearance. She is normal weight. She is not ill-appearing, toxic-appearing or diaphoretic.  HENT:     Head: Normocephalic.     Right Ear: External ear normal.     Left Ear: External ear normal.     Nose: Nose normal.     Mouth/Throat:     Mouth: Mucous membranes are moist.     Pharynx: Oropharynx is clear.  Eyes:     General:        Right eye: No discharge.        Left eye: No discharge.     Extraocular Movements: Extraocular movements intact.     Conjunctiva/sclera:  Conjunctivae normal.     Pupils: Pupils are equal, round, and reactive to light.  Cardiovascular:     Rate and Rhythm: Normal rate and regular rhythm.     Heart sounds: No murmur heard. Pulmonary:     Effort: Pulmonary effort is normal. No respiratory distress.     Breath sounds: Normal breath sounds. No wheezing or rales.  Musculoskeletal:     Cervical back: Normal range of motion and neck supple.  Skin:    General: Skin is warm and dry.     Capillary Refill: Capillary refill takes less than 2 seconds.  Neurological:     General: No focal deficit present.     Mental Status: She is alert and oriented to person, place, and time. Mental status is at baseline.  Psychiatric:        Mood and Affect: Mood normal.        Behavior: Behavior normal.        Thought Content: Thought content normal.        Judgment: Judgment normal.    Results for orders placed or performed in  visit on 04/14/21  Microscopic Examination   Urine  Result Value Ref Range   WBC, UA 0-5 0 - 5 /hpf   RBC 3-10 (A) 0 - 2 /hpf   Epithelial Cells (non renal) 0-10 0 - 10 /hpf   Mucus, UA Present (A) Not Estab.   Bacteria, UA Many (A) None seen/Few  Urine Culture   Specimen: Urine   UR  Result Value Ref Range   Urine Culture, Routine Final report (A)    Organism ID, Bacteria Escherichia coli (A)    Antimicrobial Susceptibility Comment   Urinalysis, Routine w reflex microscopic  Result Value Ref Range   Specific Gravity, UA 1.015 1.005 - 1.030   pH, UA 7.0 5.0 - 7.5   Color, UA Yellow Yellow   Appearance Ur Cloudy (A) Clear   Leukocytes,UA 1+ (A) Negative   Protein,UA Negative Negative/Trace   Glucose, UA Negative Negative   Ketones, UA Negative Negative   RBC, UA 2+ (A) Negative   Bilirubin, UA Negative Negative   Urobilinogen, Ur 0.2 0.2 - 1.0 mg/dL   Nitrite, UA Negative Negative   Microscopic Examination See below:       Assessment & Plan:   Problem List Items Addressed This Visit       Other   Fall    Recommend completing course of prednisone to help with intercostal pain. Recommend Ibuprofen TID for the next 3 days to help with pain and inflammation.  Follow up if symptoms return or do not improve.       Relevant Orders   CBC w/Diff   Other Visit Diagnoses     Anemia, unspecified type    -  Primary   Labs ordered today.  May increase ferrous sulfate if necessary.  Will make further recommendations based on lab results.    Relevant Medications   ferrous sulfate 325 (65 FE) MG tablet   Other Relevant Orders   CBC w/Diff   Urinary frequency       Will check urinalysis today due to recent UTI.  Will make recommendations based on lab results.    Relevant Orders   Urinalysis, Routine w reflex microscopic        Follow up plan: Return in about 3 months (around 08/10/2021) for Anemia check .

## 2021-05-12 LAB — CBC WITH DIFFERENTIAL/PLATELET
Basophils Absolute: 0.1 10*3/uL (ref 0.0–0.2)
Basos: 1 %
EOS (ABSOLUTE): 0.1 10*3/uL (ref 0.0–0.4)
Eos: 1 %
Hematocrit: 33.7 % — ABNORMAL LOW (ref 34.0–46.6)
Hemoglobin: 11.1 g/dL (ref 11.1–15.9)
Immature Grans (Abs): 0 10*3/uL (ref 0.0–0.1)
Immature Granulocytes: 1 %
Lymphocytes Absolute: 2.5 10*3/uL (ref 0.7–3.1)
Lymphs: 28 %
MCH: 29.4 pg (ref 26.6–33.0)
MCHC: 32.9 g/dL (ref 31.5–35.7)
MCV: 89 fL (ref 79–97)
Monocytes Absolute: 0.9 10*3/uL (ref 0.1–0.9)
Monocytes: 10 %
NRBC: 1 % — ABNORMAL HIGH (ref 0–0)
Neutrophils Absolute: 5.3 10*3/uL (ref 1.4–7.0)
Neutrophils: 59 %
Platelets: 203 10*3/uL (ref 150–450)
RBC: 3.78 x10E6/uL (ref 3.77–5.28)
RDW: 21.1 % — ABNORMAL HIGH (ref 11.7–15.4)
WBC: 8.9 10*3/uL (ref 3.4–10.8)

## 2021-05-12 NOTE — Progress Notes (Signed)
Good Morning.  Just wanted to let you know that Mckenzie Gutierrez's CBC has improved with the once daily iron supplement.  We should continue with the ferrous sulfate once a day and continue to monitor in the future.  No evidence of a UTI.  Please let me know if you have any questions.

## 2021-05-16 ENCOUNTER — Other Ambulatory Visit: Payer: Self-pay | Admitting: Nurse Practitioner

## 2021-05-16 NOTE — Telephone Encounter (Signed)
Requested medication (s) are due for refill today:Yes  Requested medication (s) are on the active medication list: yes  Last refill: 04/06/20  #90  3 refills  Future visit scheduled  Yes 09/19/21  Notes to clinic:LAst refill by Kathrine Haddock. Please review.  Requested Prescriptions  Pending Prescriptions Disp Refills   citalopram (CELEXA) 20 MG tablet [Pharmacy Med Name: CITALOPRAM HYDROBROMIDE 20 MG TAB] 90 tablet 3    Sig: TAKE 1 TABLET BY MOUTH DAILY     Psychiatry:  Antidepressants - SSRI Passed - 05/16/2021  9:48 AM      Passed - Completed PHQ-2 or PHQ-9 in the last 360 days      Passed - Valid encounter within last 6 months    Recent Outpatient Visits           5 days ago Anemia, unspecified type   [facility] Jon Billings, NP   1 month ago Primary hypertension   [facility] Jon Billings, NP   5 months ago Fall, initial encounter   [place], NP   9 months ago Paroxysmal atrial fibrillation ([place])   [place], [place] T, NP   1 year ago Major depression in complete remission [facility])   [place], NP       Future Appointments             In 4 months Jon Billings, NP [facility], [place]   In 55 months  [employer], [place]

## 2021-05-19 DIAGNOSIS — R0609 Other forms of dyspnea: Secondary | ICD-10-CM | POA: Diagnosis not present

## 2021-05-19 DIAGNOSIS — I480 Paroxysmal atrial fibrillation: Secondary | ICD-10-CM | POA: Diagnosis not present

## 2021-05-19 DIAGNOSIS — I350 Nonrheumatic aortic (valve) stenosis: Secondary | ICD-10-CM | POA: Diagnosis not present

## 2021-05-19 DIAGNOSIS — I739 Peripheral vascular disease, unspecified: Secondary | ICD-10-CM | POA: Diagnosis not present

## 2021-05-19 DIAGNOSIS — E079 Disorder of thyroid, unspecified: Secondary | ICD-10-CM | POA: Diagnosis not present

## 2021-05-19 DIAGNOSIS — Z9889 Other specified postprocedural states: Secondary | ICD-10-CM | POA: Diagnosis not present

## 2021-05-19 DIAGNOSIS — E782 Mixed hyperlipidemia: Secondary | ICD-10-CM | POA: Diagnosis not present

## 2021-05-19 DIAGNOSIS — R531 Weakness: Secondary | ICD-10-CM | POA: Diagnosis not present

## 2021-05-19 DIAGNOSIS — R011 Cardiac murmur, unspecified: Secondary | ICD-10-CM | POA: Diagnosis not present

## 2021-05-19 DIAGNOSIS — Z8673 Personal history of transient ischemic attack (TIA), and cerebral infarction without residual deficits: Secondary | ICD-10-CM | POA: Diagnosis not present

## 2021-05-19 DIAGNOSIS — E669 Obesity, unspecified: Secondary | ICD-10-CM | POA: Diagnosis not present

## 2021-05-19 DIAGNOSIS — I10 Essential (primary) hypertension: Secondary | ICD-10-CM | POA: Diagnosis not present

## 2021-05-24 DIAGNOSIS — H353211 Exudative age-related macular degeneration, right eye, with active choroidal neovascularization: Secondary | ICD-10-CM | POA: Diagnosis not present

## 2021-05-24 DIAGNOSIS — H353221 Exudative age-related macular degeneration, left eye, with active choroidal neovascularization: Secondary | ICD-10-CM | POA: Diagnosis not present

## 2021-05-26 ENCOUNTER — Other Ambulatory Visit: Payer: Self-pay | Admitting: Family Medicine

## 2021-05-27 ENCOUNTER — Other Ambulatory Visit: Payer: Self-pay | Admitting: Nurse Practitioner

## 2021-05-27 NOTE — Telephone Encounter (Signed)
Requested medications are due for refill today NO  Requested medications are on the active medication list NO  Last refill 08/12/20  Last visit 05/11/21  Future visit scheduled 09/19/21  Notes to clinic Not on current med list, please assess.

## 2021-06-10 ENCOUNTER — Encounter: Payer: Self-pay | Admitting: General Surgery

## 2021-06-28 ENCOUNTER — Other Ambulatory Visit: Payer: Self-pay | Admitting: Nurse Practitioner

## 2021-06-28 NOTE — Telephone Encounter (Signed)
Requested Prescriptions  Pending Prescriptions Disp Refills  . amLODipine (NORVASC) 5 MG tablet [Pharmacy Med Name: AMLODIPINE BESYLATE 5 MG TAB] 90 tablet 0    Sig: TAKE 1 TABLET BY MOUTH DAILY **NEED APPOINTMENT BEFORE ADDITIONAL REFILLS**     Cardiovascular:  Calcium Channel Blockers Passed - 06/28/2021  3:15 PM      Passed - Last BP in normal range    BP Readings from Last 1 Encounters:  05/11/21 133/69         Passed - Valid encounter within last 6 months    Recent Outpatient Visits          1 month ago Anemia, unspecified type   [place], NP   3 months ago Primary hypertension   [facility] Jon Billings, NP   6 months ago Fall, initial encounter   [place], NP   10 months ago Paroxysmal atrial fibrillation ([place])   [place], [place] T, NP   1 year ago Major depression in complete remission [facility])   [place], NP      Future Appointments            In 2 months Jon Billings, NP [facility], [place]   In 7 months  [employer], [place]

## 2021-07-05 ENCOUNTER — Other Ambulatory Visit: Payer: Self-pay

## 2021-07-05 ENCOUNTER — Ambulatory Visit: Payer: PPO | Attending: Internal Medicine

## 2021-07-05 DIAGNOSIS — Z23 Encounter for immunization: Secondary | ICD-10-CM

## 2021-07-05 MED ORDER — FLUAD QUADRIVALENT 0.5 ML IM PRSY
PREFILLED_SYRINGE | INTRAMUSCULAR | 0 refills | Status: DC
  Filled 2021-07-05: qty 0.5, 1d supply, fill #0

## 2021-07-05 MED ORDER — PFIZER COVID-19 VAC BIVALENT 30 MCG/0.3ML IM SUSP
INTRAMUSCULAR | 0 refills | Status: DC
  Filled 2021-07-05: qty 0.3, 1d supply, fill #0

## 2021-07-05 NOTE — Progress Notes (Signed)
   Covid-19 Vaccination Clinic  Name:  Mckenzie Gutierrez    MRN: [id number] DOB: 1921/09/06  07/05/2021  Mckenzie Gutierrez was observed post Covid-19 immunization for 15 minutes without incident. She was provided with Vaccine Information Sheet and instruction to access the V-Safe system.   Mckenzie Gutierrez was instructed to call 911 with any severe reactions post vaccine: Difficulty breathing  Swelling of face and throat  A fast heartbeat  A bad rash all over body  Dizziness and weakness   Immunizations Administered     Name Date Dose VIS Date Route   Pfizer Covid-19 Vaccine Bivalent Booster 07/05/2021 11:35 AM 0.3 mL 05/04/2021 Intramuscular   Manufacturer: [place]   Lot: [place]: [id number]      Lu Duffel, PharmD, MBA Clinical Acute Care Pharmacist

## 2021-07-19 DIAGNOSIS — H353211 Exudative age-related macular degeneration, right eye, with active choroidal neovascularization: Secondary | ICD-10-CM | POA: Diagnosis not present

## 2021-07-19 DIAGNOSIS — H43813 Vitreous degeneration, bilateral: Secondary | ICD-10-CM | POA: Diagnosis not present

## 2021-07-19 DIAGNOSIS — H353221 Exudative age-related macular degeneration, left eye, with active choroidal neovascularization: Secondary | ICD-10-CM | POA: Diagnosis not present

## 2021-07-20 DIAGNOSIS — R262 Difficulty in walking, not elsewhere classified: Secondary | ICD-10-CM | POA: Diagnosis not present

## 2021-07-20 DIAGNOSIS — R2689 Other abnormalities of gait and mobility: Secondary | ICD-10-CM | POA: Diagnosis not present

## 2021-07-20 DIAGNOSIS — G459 Transient cerebral ischemic attack, unspecified: Secondary | ICD-10-CM | POA: Diagnosis not present

## 2021-07-20 DIAGNOSIS — R4781 Slurred speech: Secondary | ICD-10-CM | POA: Diagnosis not present

## 2021-07-20 DIAGNOSIS — Z8679 Personal history of other diseases of the circulatory system: Secondary | ICD-10-CM | POA: Diagnosis not present

## 2021-07-25 ENCOUNTER — Encounter: Payer: Self-pay | Admitting: Nurse Practitioner

## 2021-07-25 ENCOUNTER — Other Ambulatory Visit: Payer: Self-pay

## 2021-07-25 ENCOUNTER — Ambulatory Visit (INDEPENDENT_AMBULATORY_CARE_PROVIDER_SITE_OTHER): Payer: PPO | Admitting: Nurse Practitioner

## 2021-07-25 VITALS — BP 129/56 | HR 69 | Temp 97.2°F

## 2021-07-25 DIAGNOSIS — R051 Acute cough: Secondary | ICD-10-CM

## 2021-07-25 DIAGNOSIS — J069 Acute upper respiratory infection, unspecified: Secondary | ICD-10-CM

## 2021-07-25 LAB — VERITOR FLU A/B WAIVED
Influenza A: NEGATIVE
Influenza B: NEGATIVE

## 2021-07-25 MED ORDER — AMOXICILLIN 500 MG PO CAPS: 500.0000 mg | ORAL_CAPSULE | Freq: Two times a day (BID) | ORAL | 0 refills | Status: AC

## 2021-07-25 NOTE — Progress Notes (Signed)
Results discussed with patient during visit today.

## 2021-07-25 NOTE — Progress Notes (Signed)
BP (!) 129/56   Pulse 69   Temp (!) 97.2 F (36.2 C) (Oral)   LMP  (LMP Unknown)   SpO2 96%    Subjective:    Patient ID: Mckenzie Gutierrez, female    DOB: 22-Nov-1921, 87 y.o.   MRN: [id number]  HPI: Mckenzie Gutierrez is a 87 y.o. female  Chief Complaint  Patient presents with   URI    Pt states she has been having a cough and congestion since Friday night. States she cough up phlegm when she coughs.    UPPER RESPIRATORY TRACT INFECTION Worst symptom: symptoms started on friday Fever: no Cough: yes Shortness of breath: no Wheezing: no Chest pain: yes, with cough Chest tightness: no Chest congestion: yes Nasal congestion: yes Runny nose: no Post nasal drip: no Sneezing:  sometimes Sore throat: no Swollen glands: no Sinus pressure: no Headache: no Face pain: no Toothache: no Ear pain: no bilateral Ear pressure: no bilateral Eyes red/itching:no Eye drainage/crusting: no  Vomiting: no Rash: no Fatigue: yes Sick contacts: no Strep contacts: no  Context: stable Recurrent sinusitis: no Relief with OTC cold/cough medications: no  Treatments attempted: none    Relevant past medical, surgical, family and social history reviewed and updated as indicated. Interim medical history since our last visit reviewed. Allergies and medications reviewed and updated.  Review of Systems  Constitutional:  Positive for fatigue. Negative for fever.  HENT:  Positive for congestion. Negative for dental problem, ear pain, postnasal drip, rhinorrhea, sinus pressure, sinus pain, sneezing and sore throat.   Respiratory:  Positive for cough. Negative for shortness of breath and wheezing.   Cardiovascular:  Negative for chest pain.  Gastrointestinal:  Negative for vomiting.  Skin:  Negative for rash.  Neurological:  Negative for headaches.   Per HPI unless specifically indicated above     Objective:    BP (!) 129/56   Pulse 69   Temp (!) 97.2 F (36.2 C) (Oral)   LMP  (LMP  Unknown)   SpO2 96%   Wt Readings from Last 3 Encounters:  05/11/21 193 lb 3.2 oz (87.6 kg)  03/18/21 192 lb 9.6 oz (87.4 kg)  03/15/21 192 lb (87.1 kg)    Physical Exam Vitals and nursing note reviewed.  Constitutional:      General: She is not in acute distress.    Appearance: Normal appearance. She is normal weight. She is not ill-appearing, toxic-appearing or diaphoretic.  HENT:     Head: Normocephalic.     Right Ear: Tympanic membrane and external ear normal.     Left Ear: Tympanic membrane and external ear normal.     Nose: Congestion and rhinorrhea present.     Right Sinus: No maxillary sinus tenderness or frontal sinus tenderness.     Left Sinus: No maxillary sinus tenderness or frontal sinus tenderness.     Mouth/Throat:     Mouth: Mucous membranes are moist.     Pharynx: Oropharynx is clear. Posterior oropharyngeal erythema present. No oropharyngeal exudate.  Eyes:     General:        Right eye: No discharge.        Left eye: No discharge.     Extraocular Movements: Extraocular movements intact.     Conjunctiva/sclera: Conjunctivae normal.     Pupils: Pupils are equal, round, and reactive to light.  Cardiovascular:     Rate and Rhythm: Normal rate and regular rhythm.     Heart sounds: No murmur heard. Pulmonary:  Effort: Pulmonary effort is normal. No respiratory distress.     Breath sounds: Normal breath sounds. No wheezing or rales.  Musculoskeletal:     Cervical back: Normal range of motion and neck supple.  Skin:    General: Skin is warm and dry.     Capillary Refill: Capillary refill takes less than 2 seconds.  Neurological:     General: No focal deficit present.     Mental Status: She is alert and oriented to person, place, and time. Mental status is at baseline.  Psychiatric:        Mood and Affect: Mood normal.        Behavior: Behavior normal.        Thought Content: Thought content normal.        Judgment: Judgment normal.    Results for orders  placed or performed in visit on 05/11/21  Microscopic Examination   Urine  Result Value Ref Range   WBC, UA None seen 0 - 5 /hpf   RBC 6-10 (A) 0 - 2 /hpf   Epithelial Cells (non renal) 0-10 0 - 10 /hpf   Mucus, UA Present (A) Not Estab.   Bacteria, UA Few (A) None seen/Few  CBC w/Diff  Result Value Ref Range   WBC 8.9 3.4 - 10.8 x10E3/uL   RBC 3.78 3.77 - 5.28 x10E6/uL   Hemoglobin 11.1 11.1 - 15.9 g/dL   Hematocrit 33.7 (L) 34.0 - 46.6 %   MCV 89 79 - 97 fL   MCH 29.4 26.6 - 33.0 pg   MCHC 32.9 31.5 - 35.7 g/dL   RDW 21.1 (H) 11.7 - 15.4 %   Platelets 203 150 - 450 x10E3/uL   Neutrophils 59 Not Estab. %   Lymphs 28 Not Estab. %   Monocytes 10 Not Estab. %   Eos 1 Not Estab. %   Basos 1 Not Estab. %   Neutrophils Absolute 5.3 1.4 - 7.0 x10E3/uL   Lymphocytes Absolute 2.5 0.7 - 3.1 x10E3/uL   Monocytes Absolute 0.9 0.1 - 0.9 x10E3/uL   EOS (ABSOLUTE) 0.1 0.0 - 0.4 x10E3/uL   Basophils Absolute 0.1 0.0 - 0.2 x10E3/uL   Immature Granulocytes 1 Not Estab. %   Immature Grans (Abs) 0.0 0.0 - 0.1 x10E3/uL   NRBC 1 (H) 0 - 0 %  Urinalysis, Routine w reflex microscopic  Result Value Ref Range   Specific Gravity, UA 1.015 1.005 - 1.030   pH, UA 7.0 5.0 - 7.5   Color, UA Yellow Yellow   Appearance Ur Clear Clear   Leukocytes,UA Negative Negative   Protein,UA Negative Negative/Trace   Glucose, UA Negative Negative   Ketones, UA Negative Negative   RBC, UA 3+ (A) Negative   Bilirubin, UA Negative Negative   Urobilinogen, Ur 0.2 0.2 - 1.0 mg/dL   Nitrite, UA Negative Negative   Microscopic Examination See below:       Assessment & Plan:   Problem List Items Addressed This Visit   None Visit Diagnoses     Viral upper respiratory tract infection    -  Primary   Will treat with amoxicillin. Complete course of antibiotics. Return to clinic if symptoms worsen or fail to improve. Negative for Flu in office.   Acute cough       Relevant Orders   Novel Coronavirus, NAA  ([facility])   Veritor Flu A/B Waived        Follow up plan: Return if symptoms worsen or fail to improve.

## 2021-07-26 LAB — NOVEL CORONAVIRUS, NAA: SARS-CoV-2, NAA: NOT DETECTED

## 2021-07-26 LAB — SARS-COV-2, NAA 2 DAY TAT

## 2021-08-02 ENCOUNTER — Ambulatory Visit: Payer: Self-pay

## 2021-08-02 NOTE — Telephone Encounter (Signed)
Coughing frequently and chest sounds congested pt pt is not coughing up any phlegm.  Last Amoxicillin  dose in the am. [facility] to deliver a prescription of Robitussin to help loosen cough to produce phlegm.    Called [facility] pharmacy and ordered Robitussin without any cough suppressant. Reason for Disposition . General information question, no triage required and triager able to answer question  Answer Assessment - Initial Assessment Questions 1. REASON FOR CALL or QUESTION: "What is your reason for calling today?" or "How can I best help you?" or "What question do you have that I can help answer?"     Needs assistance with cough and cough medication  Protocols used: Information Only Call - No Triage-A-AH

## 2021-09-01 ENCOUNTER — Other Ambulatory Visit: Payer: Self-pay | Admitting: Nurse Practitioner

## 2021-09-01 NOTE — Telephone Encounter (Signed)
Requested Prescriptions  Pending Prescriptions Disp Refills   pantoprazole (PROTONIX) 20 MG tablet [Pharmacy Med Name: PANTOPRAZOLE SODIUM 20 MG DR TAB] 90 tablet 0    Sig: TAKE 1 TABLET BY MOUTH DAILY     Gastroenterology: Proton Pump Inhibitors Passed - 09/01/2021 12:22 PM      Passed - Valid encounter within last 12 months    Recent Outpatient Visits          1 month ago Viral upper respiratory tract infection   [facility] Jon Billings, NP   3 months ago Anemia, unspecified type   [facility] Jon Billings, NP   5 months ago Primary hypertension   [facility] Jon Billings, NP   8 months ago Fall, initial encounter   [place], NP   1 year ago Paroxysmal atrial fibrillation [facility])   [place], Jolene T, NP      Future Appointments            In 2 weeks Jon Billings, NP [place], [place]   In 5 months  [facility], [facility]           ]

## 2021-09-12 DIAGNOSIS — H353211 Exudative age-related macular degeneration, right eye, with active choroidal neovascularization: Secondary | ICD-10-CM | POA: Diagnosis not present

## 2021-09-12 DIAGNOSIS — H353221 Exudative age-related macular degeneration, left eye, with active choroidal neovascularization: Secondary | ICD-10-CM | POA: Diagnosis not present

## 2021-09-16 NOTE — Progress Notes (Addendum)
BP 128/65    Pulse 69    Temp (!) 97.5 F (36.4 C) (Oral)    Wt 192 lb 12.8 oz (87.5 kg)    LMP  (LMP Unknown)    SpO2 98%    BMI 30.20 kg/m    Subjective:    Patient ID: Mckenzie Gutierrez, female    DOB: 07-Jul-1922, 88 y.o.   MRN: [id number]  HPI: Mckenzie Gutierrez is a 88 y.o. female  Chief Complaint  Patient presents with   Depression   Hypertension     AFIB Patient's daughter states that her AFIB is well controlled.  She continues to follow up with Cardiology. Had an updated ECHO done and waiting on results.   HYPERTENSION Hypertension status: controlled  Satisfied with current treatment? yes Duration of hypertension: years BP monitoring frequency:  rarely BP range: 137/52 BP medication side effects:  no Medication compliance: excellent compliance Previous BP meds:amlodipine, losartan and lasix. Aspirin: no Recurrent headaches: no Visual changes: no Palpitations: no Dyspnea: yes Chest pain: no Lower extremity edema: yes Dizzy/lightheaded: no  HYPOTHYROIDISM Thyroid control status:controlled Satisfied with current treatment? no Medication side effects: no Medication compliance: excellent compliance Etiology of hypothyroidism:  Recent dose adjustment:no Fatigue: no Cold intolerance: no Heat intolerance: no Weight gain: no Weight loss: no Constipation: no Diarrhea/loose stools: no Palpitations: no Lower extremity edema: no Anxiety/depressed mood: no  DEPRESSION Well controlled.  Related to fatigue per the patient.    [place] Office Visit from 09/19/2021 in [place]  PHQ-9 Total Score 2       FALLS Patient and daughter state that she has fallen a few times recently. Patient is unsure of what is causing her falls but does state that she feels like her legs are weaker. Patient still lives alone but she does have someone coming in a couple of times a week to help her with things around the house. She also wears a life  alert.  Relevant past medical, surgical, family and social history reviewed and updated as indicated. Interim medical history since our last visit reviewed. Allergies and medications reviewed and updated.  Review of Systems  Constitutional:  Positive for fatigue. Negative for fever and unexpected weight change.  Eyes:  Negative for visual disturbance.  Respiratory:  Negative for cough, chest tightness and shortness of breath.   Cardiovascular:  Negative for chest pain, palpitations and leg swelling.  Endocrine: Negative for cold intolerance.  Neurological:  Negative for dizziness and headaches.  Psychiatric/Behavioral:  Positive for dysphoric mood.    Per HPI unless specifically indicated above     Objective:    BP 128/65    Pulse 69    Temp (!) 97.5 F (36.4 C) (Oral)    Wt 192 lb 12.8 oz (87.5 kg)    LMP  (LMP Unknown)    SpO2 98%    BMI 30.20 kg/m   Wt Readings from Last 3 Encounters:  09/19/21 192 lb 12.8 oz (87.5 kg)  05/11/21 193 lb 3.2 oz (87.6 kg)  03/18/21 192 lb 9.6 oz (87.4 kg)    Physical Exam Vitals and nursing note reviewed.  Constitutional:      General: She is not in acute distress.    Appearance: Normal appearance. She is normal weight. She is not ill-appearing, toxic-appearing or diaphoretic.  HENT:     Head: Normocephalic.     Right Ear: External ear normal.     Left Ear: External ear normal.  Nose: Nose normal.     Mouth/Throat:     Mouth: Mucous membranes are moist.     Pharynx: Oropharynx is clear.  Eyes:     General:        Right eye: No discharge.        Left eye: No discharge.     Extraocular Movements: Extraocular movements intact.     Conjunctiva/sclera: Conjunctivae normal.     Pupils: Pupils are equal, round, and reactive to light.  Cardiovascular:     Rate and Rhythm: Normal rate and regular rhythm.     Heart sounds: Murmur heard.  Pulmonary:     Effort: Pulmonary effort is normal. No respiratory distress.     Breath sounds: Normal  breath sounds. No wheezing or rales.  Musculoskeletal:     Cervical back: Normal range of motion and neck supple.  Skin:    General: Skin is warm and dry.     Capillary Refill: Capillary refill takes less than 2 seconds.  Neurological:     General: No focal deficit present.     Mental Status: She is alert and oriented to person, place, and time. Mental status is at baseline.  Psychiatric:        Mood and Affect: Mood normal.        Behavior: Behavior normal.        Thought Content: Thought content normal.        Judgment: Judgment normal.    Results for orders placed or performed in visit on 07/25/21  Novel Coronavirus, NAA ([facility])   Specimen: Nasopharyngeal(NP) swabs in vial transport medium  Result Value Ref Range   SARS-CoV-2, NAA Not Detected Not Detected  SARS-COV-2, NAA 2 DAY TAT  Result Value Ref Range   SARS-CoV-2, NAA 2 DAY TAT Performed   Veritor Flu A/B Waived  Result Value Ref Range   Influenza A Negative Negative   Influenza B Negative Negative      Assessment & Plan:   Problem List Items Addressed This Visit       Cardiovascular and Mediastinum   Hypertension    Chronic.  Controlled.  Continue with current medication regimen of Amlodipine, Lasix and Losartan.  Labs ordered today.  Return to clinic in 3 months for reevaluation.  Call sooner if concerns arise.        Relevant Orders   Comp Met (CMET)   Lipid Profile   TSH   T4, free   Atrial fibrillation (HCC)    Chronic.  Controlled.  Continue with current medication regimen of Eliquis 2.49m BID.  Continue to follow up with Cardiology.  Labs ordered today.  Return to clinic in 3 months for reevaluation.  Call sooner if concerns arise.        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 BWallace Going  If concerns arise please  follow up in office.      Relevant Orders   Comp Met (CMET)   Lipid Profile   TSH   T4, free     Endocrine   Hypothyroid    Chronic.  Controlled.  Continue with current medication regimen.  Labs ordered today.  Return to clinic in 3 months for reevaluation.  Call sooner if concerns arise.        Relevant Orders  Comp Met (CMET)   Lipid Profile   TSH   T4, free     Hematopoietic and Hemostatic   Acquired thrombophilia ([place])    Reassured patient. Patient is also on Eliquis, precautions discussed.      Relevant Orders   Comp Met (CMET)   Lipid Profile   TSH   T4, free   CBC w/Diff     Other   Major depression in complete remission (HCC)    Chronic.  Controlled.  Continue with current medication regimen on Celexa 56m.  Labs ordered today.  Return to clinic in 3 months for reevaluation.  Call sooner if concerns arise.        Relevant Orders   Comp Met (CMET)   Lipid Profile   TSH   T4, free   Fall   Other Visit Diagnoses     Urinary frequency       Patient's daughter brought in urine sample from home. Urinalysis showed 2+leuks. Patient is asymptomatic. Will send for culture before treating.   Relevant Orders   Urinalysis, Routine w reflex microscopic   Abnormal urinalysis       Relevant Orders   Urine Culture        Follow up plan: Return in about 3 months (around 12/18/2021) for HTN, HLD, DM2 FU.

## 2021-09-19 ENCOUNTER — Ambulatory Visit (INDEPENDENT_AMBULATORY_CARE_PROVIDER_SITE_OTHER): Payer: PPO | Admitting: Nurse Practitioner

## 2021-09-19 ENCOUNTER — Other Ambulatory Visit: Payer: Self-pay

## 2021-09-19 ENCOUNTER — Encounter: Payer: Self-pay | Admitting: Nurse Practitioner

## 2021-09-19 VITALS — BP 128/65 | HR 69 | Temp 97.5°F | Wt 192.8 lb

## 2021-09-19 DIAGNOSIS — R8290 Unspecified abnormal findings in urine: Secondary | ICD-10-CM | POA: Diagnosis not present

## 2021-09-19 DIAGNOSIS — R350 Frequency of micturition: Secondary | ICD-10-CM

## 2021-09-19 DIAGNOSIS — I4891 Unspecified atrial fibrillation: Secondary | ICD-10-CM | POA: Diagnosis not present

## 2021-09-19 DIAGNOSIS — F3342 Major depressive disorder, recurrent, in full remission: Secondary | ICD-10-CM | POA: Diagnosis not present

## 2021-09-19 DIAGNOSIS — N390 Urinary tract infection, site not specified: Secondary | ICD-10-CM

## 2021-09-19 DIAGNOSIS — H353221 Exudative age-related macular degeneration, left eye, with active choroidal neovascularization: Secondary | ICD-10-CM | POA: Diagnosis not present

## 2021-09-19 DIAGNOSIS — E039 Hypothyroidism, unspecified: Secondary | ICD-10-CM | POA: Diagnosis not present

## 2021-09-19 DIAGNOSIS — W19XXXD Unspecified fall, subsequent encounter: Secondary | ICD-10-CM

## 2021-09-19 DIAGNOSIS — I10 Essential (primary) hypertension: Secondary | ICD-10-CM | POA: Diagnosis not present

## 2021-09-19 DIAGNOSIS — D6869 Other thrombophilia: Secondary | ICD-10-CM | POA: Diagnosis not present

## 2021-09-19 LAB — URINALYSIS, ROUTINE W REFLEX MICROSCOPIC
Bilirubin, UA: NEGATIVE
Glucose, UA: NEGATIVE
Ketones, UA: NEGATIVE
Nitrite, UA: POSITIVE — AB
Specific Gravity, UA: >1.03 — ABNORMAL HIGH (ref 1.005–1.030)
Urobilinogen, Ur: 0.2 mg/dL (ref 0.2–1.0)
pH, UA: 5.5 (ref 5.0–7.5)

## 2021-09-19 LAB — MICROSCOPIC EXAMINATION

## 2021-09-19 NOTE — Assessment & Plan Note (Signed)
Chronic.  Controlled.  Continue with current medication regimen of Eliquis 2.5mg  BID.  Continue to follow up with Cardiology.  Labs ordered today.  Return to clinic in 3 months for reevaluation.  Call sooner if concerns arise.

## 2021-09-19 NOTE — Assessment & Plan Note (Addendum)
Chronic.  Controlled.  Continue with current medication regimen on Celexa 20mg .  Labs ordered today.  Return to clinic in 3 months for reevaluation.  Call sooner if concerns arise.

## 2021-09-19 NOTE — Assessment & Plan Note (Signed)
Chronic.  Controlled.  Continue with current medication regimen.  Labs ordered today.  Return to clinic in 3 months for reevaluation.  Call sooner if concerns arise.   

## 2021-09-19 NOTE — Assessment & Plan Note (Signed)
Reassured patient. Patient is also on Eliquis, precautions discussed.

## 2021-09-19 NOTE — Assessment & Plan Note (Signed)
Continue to follow up with Dr. Brasington.  If concerns arise please follow up in office. 

## 2021-09-19 NOTE — Assessment & Plan Note (Signed)
Chronic.  Controlled.  Continue with current medication regimen of Amlodipine, Lasix and Losartan.  Labs ordered today.  Return to clinic in 3 months for reevaluation.  Call sooner if concerns arise.

## 2021-09-20 ENCOUNTER — Telehealth: Payer: Self-pay

## 2021-09-20 LAB — CBC WITH DIFFERENTIAL/PLATELET
Basophils Absolute: 0.1 10*3/uL (ref 0.0–0.2)
Basos: 2 %
EOS (ABSOLUTE): 0.1 10*3/uL (ref 0.0–0.4)
Eos: 1 %
Hematocrit: 34.5 % (ref 34.0–46.6)
Hemoglobin: 11.5 g/dL (ref 11.1–15.9)
Immature Grans (Abs): 0.1 10*3/uL (ref 0.0–0.1)
Immature Granulocytes: 1 %
Lymphocytes Absolute: 2 10*3/uL (ref 0.7–3.1)
Lymphs: 34 %
MCH: 32.4 pg (ref 26.6–33.0)
MCHC: 33.3 g/dL (ref 31.5–35.7)
MCV: 97 fL (ref 79–97)
Monocytes Absolute: 0.7 10*3/uL (ref 0.1–0.9)
Monocytes: 12 %
NRBC: 1 % — ABNORMAL HIGH (ref 0–0)
Neutrophils Absolute: 3 10*3/uL (ref 1.4–7.0)
Neutrophils: 50 %
Platelets: 203 10*3/uL (ref 150–450)
RBC: 3.55 x10E6/uL — ABNORMAL LOW (ref 3.77–5.28)
RDW: 15.5 % — ABNORMAL HIGH (ref 11.7–15.4)
WBC: 5.9 10*3/uL (ref 3.4–10.8)

## 2021-09-20 LAB — COMPREHENSIVE METABOLIC PANEL
ALT: 11 IU/L (ref 0–32)
AST: 23 IU/L (ref 0–40)
Albumin/Globulin Ratio: 1.6 (ref 1.2–2.2)
Albumin: 4.2 g/dL (ref 3.5–4.6)
Alkaline Phosphatase: 80 IU/L (ref 44–121)
BUN/Creatinine Ratio: 17 (ref 12–28)
BUN: 15 mg/dL (ref 10–36)
Bilirubin Total: 0.4 mg/dL (ref 0.0–1.2)
CO2: 25 mmol/L (ref 20–29)
Calcium: 9.4 mg/dL (ref 8.7–10.3)
Chloride: 101 mmol/L (ref 96–106)
Creatinine, Ser: 0.87 mg/dL (ref 0.57–1.00)
Globulin, Total: 2.6 g/dL (ref 1.5–4.5)
Glucose: 101 mg/dL — ABNORMAL HIGH (ref 70–99)
Potassium: 4 mmol/L (ref 3.5–5.2)
Sodium: 140 mmol/L (ref 134–144)
Total Protein: 6.8 g/dL (ref 6.0–8.5)
eGFR: 61 mL/min/{1.73_m2} (ref >59)

## 2021-09-20 LAB — LIPID PANEL
Chol/HDL Ratio: 2.6 ratio (ref 0.0–4.4)
Cholesterol, Total: 145 mg/dL (ref 100–199)
HDL: 55 mg/dL (ref >39)
LDL Chol Calc (NIH): 76 mg/dL (ref 0–99)
Triglycerides: 70 mg/dL (ref 0–149)
VLDL Cholesterol Cal: 14 mg/dL (ref 5–40)

## 2021-09-20 LAB — TSH: TSH: 2.27 u[IU]/mL (ref 0.450–4.500)

## 2021-09-20 LAB — T4, FREE: Free T4: 1.31 ng/dL (ref 0.82–1.77)

## 2021-09-20 NOTE — Telephone Encounter (Signed)
ERROR

## 2021-09-20 NOTE — Progress Notes (Signed)
Please let patient know that her lab work looks good.  The urine sample that was dropped off was abnormal. I have sent it for culture and will treat based off the results.  Please let me know if she has any questions.

## 2021-09-22 MED ORDER — SULFAMETHOXAZOLE-TRIMETHOPRIM 800-160 MG PO TABS: 1.0000 | ORAL_TABLET | Freq: Two times a day (BID) | ORAL | 0 refills | Status: DC

## 2021-09-22 NOTE — Progress Notes (Signed)
Please let patient's daughter know that her lab work shows that her urine did grow bacteria. Indicating that she does have a UTI. I have sent in Bactrim to the pharmacy for her.  This could be contributing to her recent falls. Please let me know if she has any questions.

## 2021-09-22 NOTE — Addendum Note (Signed)
Addended by: Jon Billings on: 09/22/2021 10:00 AM   Modules accepted: Orders

## 2021-09-23 LAB — URINE CULTURE

## 2021-09-23 NOTE — Progress Notes (Signed)
Please let patient's daughter know that the urine grew Klebsiella. This is a difficult bacteria to treat but the bactrim that was sent in should take care of it.  I would like her to bring another urine sample by 1 week after completing the antibiotic.  Please let me know if she has any questions.

## 2021-09-23 NOTE — Addendum Note (Signed)
Addended by: Jon Billings on: 09/23/2021 01:00 PM   Modules accepted: Orders

## 2021-09-25 ENCOUNTER — Encounter: Payer: Self-pay | Admitting: Nurse Practitioner

## 2021-09-25 DIAGNOSIS — W19XXXD Unspecified fall, subsequent encounter: Secondary | ICD-10-CM

## 2021-09-29 ENCOUNTER — Other Ambulatory Visit: Payer: PPO

## 2021-09-29 ENCOUNTER — Other Ambulatory Visit: Payer: Self-pay | Admitting: Nurse Practitioner

## 2021-09-29 DIAGNOSIS — N390 Urinary tract infection, site not specified: Secondary | ICD-10-CM | POA: Diagnosis not present

## 2021-09-29 LAB — URINALYSIS, ROUTINE W REFLEX MICROSCOPIC
Bilirubin, UA: NEGATIVE
Glucose, UA: NEGATIVE
Ketones, UA: NEGATIVE
Nitrite, UA: NEGATIVE
Protein,UA: NEGATIVE
Specific Gravity, UA: 1.015 (ref 1.005–1.030)
Urobilinogen, Ur: 0.2 mg/dL (ref 0.2–1.0)
pH, UA: 7 (ref 5.0–7.5)

## 2021-09-29 LAB — MICROSCOPIC EXAMINATION

## 2021-09-29 NOTE — Progress Notes (Signed)
[name] urine still has white blood cells which indicate infection so I am going to send it for further testing to see if she needs an antibiotic.  I will follow up with you about the results.

## 2021-09-29 NOTE — Progress Notes (Unsigned)
Labs placed.

## 2021-09-29 NOTE — Telephone Encounter (Signed)
Requested Prescriptions  Pending Prescriptions Disp Refills   amLODipine (NORVASC) 5 MG tablet [Pharmacy Med Name: AMLODIPINE BESYLATE 5 MG TAB] 90 tablet 0    Sig: TAKE 1 TABLET BY MOUTH DAILY **NEED APPOINTMENT BEFORE ADDITIONAL REFILLS**     Cardiovascular:  Calcium Channel Blockers Passed - 09/29/2021 12:37 PM      Passed - Last BP in normal range    BP Readings from Last 1 Encounters:  09/19/21 128/65         Passed - Valid encounter within last 6 months    Recent Outpatient Visits          1 week ago Exudative age-related macular degeneration of left eye with active choroidal neovascularization ([place])   [facility] Jon Billings, NP   2 months ago Viral upper respiratory tract infection   [facility] Jon Billings, NP   4 months ago Anemia, unspecified type   [facility] Jon Billings, NP   6 months ago Primary hypertension   [facility] Jon Billings, NP   9 months ago Fall, initial encounter   [facility] Charyl Dancer, NP      Future Appointments            In 2 months Jon Billings, NP [place], [place]   In 4 months  [employer], [place]

## 2021-10-01 LAB — URINE CULTURE

## 2021-10-03 NOTE — Progress Notes (Signed)
HI [name].  Your mom's urine culture did not grow any abnormal bacteria.  There is not need for further treatment.  Please let me know if you have any questions.

## 2021-10-04 DIAGNOSIS — R5382 Chronic fatigue, unspecified: Secondary | ICD-10-CM | POA: Diagnosis not present

## 2021-10-04 DIAGNOSIS — R0609 Other forms of dyspnea: Secondary | ICD-10-CM | POA: Diagnosis not present

## 2021-10-04 DIAGNOSIS — I739 Peripheral vascular disease, unspecified: Secondary | ICD-10-CM | POA: Diagnosis not present

## 2021-10-04 DIAGNOSIS — Z8673 Personal history of transient ischemic attack (TIA), and cerebral infarction without residual deficits: Secondary | ICD-10-CM | POA: Diagnosis not present

## 2021-10-04 DIAGNOSIS — E782 Mixed hyperlipidemia: Secondary | ICD-10-CM | POA: Diagnosis not present

## 2021-10-04 DIAGNOSIS — I10 Essential (primary) hypertension: Secondary | ICD-10-CM | POA: Diagnosis not present

## 2021-10-04 DIAGNOSIS — I6523 Occlusion and stenosis of bilateral carotid arteries: Secondary | ICD-10-CM | POA: Diagnosis not present

## 2021-10-04 DIAGNOSIS — I350 Nonrheumatic aortic (valve) stenosis: Secondary | ICD-10-CM | POA: Diagnosis not present

## 2021-10-04 DIAGNOSIS — M25473 Effusion, unspecified ankle: Secondary | ICD-10-CM | POA: Diagnosis not present

## 2021-10-04 DIAGNOSIS — I480 Paroxysmal atrial fibrillation: Secondary | ICD-10-CM | POA: Diagnosis not present

## 2021-10-04 DIAGNOSIS — Z9889 Other specified postprocedural states: Secondary | ICD-10-CM | POA: Diagnosis not present

## 2021-10-04 DIAGNOSIS — I38 Endocarditis, valve unspecified: Secondary | ICD-10-CM | POA: Diagnosis not present

## 2021-10-05 DIAGNOSIS — D649 Anemia, unspecified: Secondary | ICD-10-CM | POA: Diagnosis not present

## 2021-10-05 DIAGNOSIS — Z87440 Personal history of urinary (tract) infections: Secondary | ICD-10-CM | POA: Diagnosis not present

## 2021-10-05 DIAGNOSIS — K219 Gastro-esophageal reflux disease without esophagitis: Secondary | ICD-10-CM | POA: Diagnosis not present

## 2021-10-05 DIAGNOSIS — M25531 Pain in right wrist: Secondary | ICD-10-CM | POA: Diagnosis not present

## 2021-10-05 DIAGNOSIS — D6869 Other thrombophilia: Secondary | ICD-10-CM | POA: Diagnosis not present

## 2021-10-05 DIAGNOSIS — I739 Peripheral vascular disease, unspecified: Secondary | ICD-10-CM | POA: Diagnosis not present

## 2021-10-05 DIAGNOSIS — H353221 Exudative age-related macular degeneration, left eye, with active choroidal neovascularization: Secondary | ICD-10-CM | POA: Diagnosis not present

## 2021-10-05 DIAGNOSIS — M810 Age-related osteoporosis without current pathological fracture: Secondary | ICD-10-CM | POA: Diagnosis not present

## 2021-10-05 DIAGNOSIS — I119 Hypertensive heart disease without heart failure: Secondary | ICD-10-CM | POA: Diagnosis not present

## 2021-10-05 DIAGNOSIS — M48061 Spinal stenosis, lumbar region without neurogenic claudication: Secondary | ICD-10-CM | POA: Diagnosis not present

## 2021-10-05 DIAGNOSIS — Z7901 Long term (current) use of anticoagulants: Secondary | ICD-10-CM | POA: Diagnosis not present

## 2021-10-05 DIAGNOSIS — Z8673 Personal history of transient ischemic attack (TIA), and cerebral infarction without residual deficits: Secondary | ICD-10-CM | POA: Diagnosis not present

## 2021-10-05 DIAGNOSIS — I38 Endocarditis, valve unspecified: Secondary | ICD-10-CM | POA: Diagnosis not present

## 2021-10-05 DIAGNOSIS — R350 Frequency of micturition: Secondary | ICD-10-CM | POA: Diagnosis not present

## 2021-10-05 DIAGNOSIS — E785 Hyperlipidemia, unspecified: Secondary | ICD-10-CM | POA: Diagnosis not present

## 2021-10-05 DIAGNOSIS — I480 Paroxysmal atrial fibrillation: Secondary | ICD-10-CM | POA: Diagnosis not present

## 2021-10-05 DIAGNOSIS — M47816 Spondylosis without myelopathy or radiculopathy, lumbar region: Secondary | ICD-10-CM | POA: Diagnosis not present

## 2021-10-05 DIAGNOSIS — M545 Low back pain, unspecified: Secondary | ICD-10-CM | POA: Diagnosis not present

## 2021-10-05 DIAGNOSIS — Z9181 History of falling: Secondary | ICD-10-CM | POA: Diagnosis not present

## 2021-10-05 DIAGNOSIS — E039 Hypothyroidism, unspecified: Secondary | ICD-10-CM | POA: Diagnosis not present

## 2021-10-05 DIAGNOSIS — F325 Major depressive disorder, single episode, in full remission: Secondary | ICD-10-CM | POA: Diagnosis not present

## 2021-10-05 DIAGNOSIS — M25511 Pain in right shoulder: Secondary | ICD-10-CM | POA: Diagnosis not present

## 2021-10-05 DIAGNOSIS — I6529 Occlusion and stenosis of unspecified carotid artery: Secondary | ICD-10-CM | POA: Diagnosis not present

## 2021-10-06 ENCOUNTER — Other Ambulatory Visit: Payer: Self-pay | Admitting: Family Medicine

## 2021-10-06 ENCOUNTER — Encounter: Payer: Self-pay | Admitting: Nurse Practitioner

## 2021-10-06 NOTE — Telephone Encounter (Signed)
Requested medications are due for refill today.  unsure  Requested medications are on the active medications list.  no  Last refill. 02/11/2020  Future visit scheduled.   yes  Notes to clinic.  Medication not assigned to a protocol. Medication was discontinued 04/05/2020.    Requested Prescriptions  Pending Prescriptions Disp Refills   hydrocortisone (ANUSOL-HC) 25 MG suppository [Pharmacy Med Name: HYDROCORTISONE ACETATE 25 MG RECTAL] 12 suppository 2    Sig: INSERT ONE SUPPOSITYR RECTALLY TWICE A DAY AS NEEDED FOR HEMORRHODS OR ANAL ITCHING     Off-Protocol Failed - 10/06/2021  4:19 PM      Failed - Medication not assigned to a protocol, review manually.      Passed - Valid encounter within last 12 months    Recent Outpatient Visits           2 weeks ago Exudative age-related macular degeneration of left eye with active choroidal neovascularization ([place])   [facility] Jon Billings, NP   2 months ago Viral upper respiratory tract infection   [facility] Jon Billings, NP   4 months ago Anemia, unspecified type   [facility] Jon Billings, NP   6 months ago Primary hypertension   [facility] Jon Billings, NP   10 months ago Fall, initial encounter   [facility] Charyl Dancer, NP       Future Appointments             In 2 months Jon Billings, NP [facility], [place]   In 4 months  [employer], [place]

## 2021-10-07 MED ORDER — HYDROCORTISONE ACETATE 25 MG RE SUPP: 25.0000 mg | Freq: Two times a day (BID) | RECTAL | 1 refills | Status: DC

## 2021-10-12 ENCOUNTER — Telehealth: Payer: Self-pay | Admitting: Nurse Practitioner

## 2021-10-12 DIAGNOSIS — I480 Paroxysmal atrial fibrillation: Secondary | ICD-10-CM | POA: Diagnosis not present

## 2021-10-12 DIAGNOSIS — M25511 Pain in right shoulder: Secondary | ICD-10-CM | POA: Diagnosis not present

## 2021-10-12 DIAGNOSIS — D649 Anemia, unspecified: Secondary | ICD-10-CM | POA: Diagnosis not present

## 2021-10-12 DIAGNOSIS — I38 Endocarditis, valve unspecified: Secondary | ICD-10-CM | POA: Diagnosis not present

## 2021-10-12 DIAGNOSIS — I739 Peripheral vascular disease, unspecified: Secondary | ICD-10-CM | POA: Diagnosis not present

## 2021-10-12 DIAGNOSIS — F325 Major depressive disorder, single episode, in full remission: Secondary | ICD-10-CM | POA: Diagnosis not present

## 2021-10-12 DIAGNOSIS — R350 Frequency of micturition: Secondary | ICD-10-CM | POA: Diagnosis not present

## 2021-10-12 DIAGNOSIS — M47816 Spondylosis without myelopathy or radiculopathy, lumbar region: Secondary | ICD-10-CM | POA: Diagnosis not present

## 2021-10-12 DIAGNOSIS — Z8673 Personal history of transient ischemic attack (TIA), and cerebral infarction without residual deficits: Secondary | ICD-10-CM | POA: Diagnosis not present

## 2021-10-12 DIAGNOSIS — I119 Hypertensive heart disease without heart failure: Secondary | ICD-10-CM | POA: Diagnosis not present

## 2021-10-12 DIAGNOSIS — H6123 Impacted cerumen, bilateral: Secondary | ICD-10-CM | POA: Diagnosis not present

## 2021-10-12 DIAGNOSIS — E039 Hypothyroidism, unspecified: Secondary | ICD-10-CM | POA: Diagnosis not present

## 2021-10-12 DIAGNOSIS — I6529 Occlusion and stenosis of unspecified carotid artery: Secondary | ICD-10-CM | POA: Diagnosis not present

## 2021-10-12 DIAGNOSIS — Z87440 Personal history of urinary (tract) infections: Secondary | ICD-10-CM | POA: Diagnosis not present

## 2021-10-12 DIAGNOSIS — M545 Low back pain, unspecified: Secondary | ICD-10-CM | POA: Diagnosis not present

## 2021-10-12 DIAGNOSIS — D6869 Other thrombophilia: Secondary | ICD-10-CM | POA: Diagnosis not present

## 2021-10-12 DIAGNOSIS — Z7901 Long term (current) use of anticoagulants: Secondary | ICD-10-CM | POA: Diagnosis not present

## 2021-10-12 DIAGNOSIS — M48061 Spinal stenosis, lumbar region without neurogenic claudication: Secondary | ICD-10-CM | POA: Diagnosis not present

## 2021-10-12 DIAGNOSIS — Z9181 History of falling: Secondary | ICD-10-CM | POA: Diagnosis not present

## 2021-10-12 DIAGNOSIS — M25531 Pain in right wrist: Secondary | ICD-10-CM | POA: Diagnosis not present

## 2021-10-12 DIAGNOSIS — H353221 Exudative age-related macular degeneration, left eye, with active choroidal neovascularization: Secondary | ICD-10-CM | POA: Diagnosis not present

## 2021-10-12 DIAGNOSIS — E785 Hyperlipidemia, unspecified: Secondary | ICD-10-CM | POA: Diagnosis not present

## 2021-10-12 DIAGNOSIS — K219 Gastro-esophageal reflux disease without esophagitis: Secondary | ICD-10-CM | POA: Diagnosis not present

## 2021-10-12 DIAGNOSIS — M810 Age-related osteoporosis without current pathological fracture: Secondary | ICD-10-CM | POA: Diagnosis not present

## 2021-10-12 DIAGNOSIS — H903 Sensorineural hearing loss, bilateral: Secondary | ICD-10-CM | POA: Diagnosis not present

## 2021-10-12 NOTE — Telephone Encounter (Signed)
Copied from [place] [id number]. Topic: General - Other >> Oct 12, 2021  3:02 PM McGill, Nelva Bush wrote: Reason for CRM: Levada Dy from [facility] stated that she received pt order [phone] for plan of care. However, it does not have Jon Billings NP NPI number, which is needed.   Levada Dy would like the order to be resent include NPI number as well.  Fax # [phone]

## 2021-10-13 NOTE — Telephone Encounter (Signed)
Please put my NPI number on the form and fax it back. I signed it yesterday so it is likely in the scan bin.

## 2021-10-14 NOTE — Telephone Encounter (Signed)
Paperwork was re-faxed per Yvonna Alanis, [place]

## 2021-10-17 DIAGNOSIS — H353211 Exudative age-related macular degeneration, right eye, with active choroidal neovascularization: Secondary | ICD-10-CM | POA: Diagnosis not present

## 2021-10-17 DIAGNOSIS — H353221 Exudative age-related macular degeneration, left eye, with active choroidal neovascularization: Secondary | ICD-10-CM | POA: Diagnosis not present

## 2021-11-02 DIAGNOSIS — M545 Low back pain, unspecified: Secondary | ICD-10-CM | POA: Diagnosis not present

## 2021-11-02 DIAGNOSIS — D6869 Other thrombophilia: Secondary | ICD-10-CM | POA: Diagnosis not present

## 2021-11-02 DIAGNOSIS — M25511 Pain in right shoulder: Secondary | ICD-10-CM | POA: Diagnosis not present

## 2021-11-02 DIAGNOSIS — H353221 Exudative age-related macular degeneration, left eye, with active choroidal neovascularization: Secondary | ICD-10-CM | POA: Diagnosis not present

## 2021-11-02 DIAGNOSIS — Z7901 Long term (current) use of anticoagulants: Secondary | ICD-10-CM | POA: Diagnosis not present

## 2021-11-02 DIAGNOSIS — I119 Hypertensive heart disease without heart failure: Secondary | ICD-10-CM | POA: Diagnosis not present

## 2021-11-02 DIAGNOSIS — M25531 Pain in right wrist: Secondary | ICD-10-CM | POA: Diagnosis not present

## 2021-11-02 DIAGNOSIS — I6529 Occlusion and stenosis of unspecified carotid artery: Secondary | ICD-10-CM | POA: Diagnosis not present

## 2021-11-02 DIAGNOSIS — K219 Gastro-esophageal reflux disease without esophagitis: Secondary | ICD-10-CM | POA: Diagnosis not present

## 2021-11-02 DIAGNOSIS — R350 Frequency of micturition: Secondary | ICD-10-CM | POA: Diagnosis not present

## 2021-11-02 DIAGNOSIS — M810 Age-related osteoporosis without current pathological fracture: Secondary | ICD-10-CM | POA: Diagnosis not present

## 2021-11-02 DIAGNOSIS — I38 Endocarditis, valve unspecified: Secondary | ICD-10-CM | POA: Diagnosis not present

## 2021-11-02 DIAGNOSIS — I739 Peripheral vascular disease, unspecified: Secondary | ICD-10-CM | POA: Diagnosis not present

## 2021-11-02 DIAGNOSIS — F325 Major depressive disorder, single episode, in full remission: Secondary | ICD-10-CM | POA: Diagnosis not present

## 2021-11-02 DIAGNOSIS — E785 Hyperlipidemia, unspecified: Secondary | ICD-10-CM | POA: Diagnosis not present

## 2021-11-02 DIAGNOSIS — D649 Anemia, unspecified: Secondary | ICD-10-CM | POA: Diagnosis not present

## 2021-11-02 DIAGNOSIS — Z87440 Personal history of urinary (tract) infections: Secondary | ICD-10-CM | POA: Diagnosis not present

## 2021-11-02 DIAGNOSIS — I480 Paroxysmal atrial fibrillation: Secondary | ICD-10-CM | POA: Diagnosis not present

## 2021-11-02 DIAGNOSIS — E039 Hypothyroidism, unspecified: Secondary | ICD-10-CM | POA: Diagnosis not present

## 2021-11-02 DIAGNOSIS — Z8673 Personal history of transient ischemic attack (TIA), and cerebral infarction without residual deficits: Secondary | ICD-10-CM | POA: Diagnosis not present

## 2021-11-02 DIAGNOSIS — M48061 Spinal stenosis, lumbar region without neurogenic claudication: Secondary | ICD-10-CM | POA: Diagnosis not present

## 2021-11-02 DIAGNOSIS — M47816 Spondylosis without myelopathy or radiculopathy, lumbar region: Secondary | ICD-10-CM | POA: Diagnosis not present

## 2021-11-02 DIAGNOSIS — Z9181 History of falling: Secondary | ICD-10-CM | POA: Diagnosis not present

## 2021-11-07 ENCOUNTER — Encounter: Payer: Self-pay | Admitting: Nurse Practitioner

## 2021-11-08 MED ORDER — SERTRALINE HCL 25 MG PO TABS: 25.0000 mg | ORAL_TABLET | Freq: Every day | ORAL | 0 refills | Status: DC

## 2021-11-24 ENCOUNTER — Other Ambulatory Visit: Payer: Self-pay | Admitting: Nurse Practitioner

## 2021-11-26 NOTE — Telephone Encounter (Signed)
Requested Prescriptions  ?Pending Prescriptions Disp Refills  ?? losartan (COZAAR) 100 MG tablet [Pharmacy Med Name: LOSARTAN POTASSIUM 100 MG TAB] 90 tablet 1  ?  Sig: TAKE ONE TABLET EVERY DAY  ?  ? Cardiovascular:  Angiotensin Receptor Blockers Passed - 11/24/2021 10:56 AM  ?  ?  Passed - Cr in normal range and within 180 days  ?  Creatinine, Ser  ?Date Value Ref Range Status  ?09/19/2021 0.87 0.57 - 1.00 mg/dL Final  ?   ?  ?  Passed - K in normal range and within 180 days  ?  Potassium  ?Date Value Ref Range Status  ?09/19/2021 4.0 3.5 - 5.2 mmol/L Final  ?   ?  ?  Passed - Patient is not pregnant  ?  ?  Passed - Last BP in normal range  ?  BP Readings from Last 1 Encounters:  ?09/19/21 128/65  ?   ?  ?  Passed - Valid encounter within last 6 months  ?  Recent Outpatient Visits   ?      ? 2 months ago Exudative age-related macular degeneration of left eye with active choroidal neovascularization ([place])  ? [place], NP  ? 4 months ago Viral upper respiratory tract infection  ? [place], NP  ? 6 months ago Anemia, unspecified type  ? [place], NP  ? 8 months ago Primary hypertension  ? [place], NP  ? 11 months ago Fall, initial encounter  ? [facility], Scheryl Darter, NP  ?  ?  ?Future Appointments   ?        ? In 3 weeks Jon Billings, NP [facility], [facility]  ? In 2 months  [place], [facility]  ?  ? ?  ?  ?  ?? levothyroxine (SYNTHROID) 75 MCG tablet [Pharmacy Med Name: LEVOTHYROXINE SODIUM 75 MCG TAB] 90 tablet 1  ?  Sig: TAKE 1 TABLET EVERY DAY ON EMPTY STOMACHWITH A GLASS OF WATER AT LEAST 30-60 MINBEFORE BREAKFAST  ?  ? Endocrinology:  Hypothyroid Agents Passed - 11/24/2021 10:56 AM  ?  ?  Passed - TSH in normal range and within 360 days  ?  TSH  ?Date Value Ref Range Status  ?09/19/2021 2.270 0.450 - 4.500 uIU/mL Final  ?   ?  ?  Passed  - Valid encounter within last 12 months  ?  Recent Outpatient Visits   ?      ? 2 months ago Exudative age-related macular degeneration of left eye with active choroidal neovascularization ([place])  ? [place], NP  ? 4 months ago Viral upper respiratory tract infection  ? [place], NP  ? 6 months ago Anemia, unspecified type  ? [place], NP  ? 8 months ago Primary hypertension  ? [place], NP  ? 11 months ago Fall, initial encounter  ? [facility], Scheryl Darter, NP  ?  ?  ?Future Appointments   ?        ? In 3 weeks Jon Billings, NP [facility], [facility]  ? In 2 months  [facility], [facility]  ?  ? ?  ?  ?  ? ?

## 2021-11-29 DIAGNOSIS — H353221 Exudative age-related macular degeneration, left eye, with active choroidal neovascularization: Secondary | ICD-10-CM | POA: Diagnosis not present

## 2021-11-29 DIAGNOSIS — H353211 Exudative age-related macular degeneration, right eye, with active choroidal neovascularization: Secondary | ICD-10-CM | POA: Diagnosis not present

## 2021-12-05 ENCOUNTER — Other Ambulatory Visit: Payer: Self-pay | Admitting: Nurse Practitioner

## 2021-12-06 NOTE — Telephone Encounter (Signed)
Requested Prescriptions  ?Pending Prescriptions Disp Refills  ?? pantoprazole (PROTONIX) 20 MG tablet [Pharmacy Med Name: PANTOPRAZOLE SODIUM 20 MG DR TAB] 90 tablet 0  ?  Sig: TAKE 1 TABLET BY MOUTH DAILY  ?  ? Gastroenterology: Proton Pump Inhibitors Passed - 12/05/2021 11:13 AM  ?  ?  Passed - Valid encounter within last 12 months  ?  Recent Outpatient Visits   ?      ? 2 months ago Exudative age-related macular degeneration of left eye with active choroidal neovascularization ([place])  ? [place], NP  ? 4 months ago Viral upper respiratory tract infection  ? [place], NP  ? 6 months ago Anemia, unspecified type  ? [place], NP  ? 8 months ago Primary hypertension  ? [place], NP  ? 12 months ago Fall, initial encounter  ? [facility], Scheryl Darter, NP  ?  ?  ?Future Appointments   ?        ? In 2 weeks Jon Billings, NP [facility], [facility]  ? In 2 months  [facility], [facility]  ?  ? ?  ?  ?  ? ?

## 2021-12-13 DIAGNOSIS — L728 Other follicular cysts of the skin and subcutaneous tissue: Secondary | ICD-10-CM | POA: Diagnosis not present

## 2021-12-20 ENCOUNTER — Other Ambulatory Visit: Payer: Self-pay | Admitting: Gastroenterology

## 2021-12-20 ENCOUNTER — Encounter: Payer: Self-pay | Admitting: Nurse Practitioner

## 2021-12-20 ENCOUNTER — Ambulatory Visit (INDEPENDENT_AMBULATORY_CARE_PROVIDER_SITE_OTHER): Payer: PPO | Admitting: Nurse Practitioner

## 2021-12-20 ENCOUNTER — Other Ambulatory Visit: Payer: Self-pay | Admitting: Nurse Practitioner

## 2021-12-20 VITALS — BP 156/73 | HR 72 | Temp 97.6°F | Wt 191.6 lb

## 2021-12-20 DIAGNOSIS — E039 Hypothyroidism, unspecified: Secondary | ICD-10-CM

## 2021-12-20 DIAGNOSIS — F3342 Major depressive disorder, recurrent, in full remission: Secondary | ICD-10-CM

## 2021-12-20 DIAGNOSIS — I4891 Unspecified atrial fibrillation: Secondary | ICD-10-CM

## 2021-12-20 DIAGNOSIS — I10 Essential (primary) hypertension: Secondary | ICD-10-CM | POA: Diagnosis not present

## 2021-12-20 DIAGNOSIS — D6869 Other thrombophilia: Secondary | ICD-10-CM

## 2021-12-20 MED ORDER — SERTRALINE HCL 25 MG PO TABS: 25.0000 mg | ORAL_TABLET | Freq: Every day | ORAL | 1 refills | Status: DC

## 2021-12-20 NOTE — Progress Notes (Signed)
? ?BP (!) 156/73 (BP Location: Left Arm)   Pulse 72   Temp 97.6 ?F (36.4 ?C) (Oral)   Wt 191 lb 9.6 oz (86.9 kg)   LMP  (LMP Unknown)   SpO2 94%   BMI 30.01 kg/m?   ? ?Subjective:  ? ? Patient ID: Mckenzie Gutierrez, female    DOB: 1922/04/25, 88 y.o.   MRN: [id number] ? ?HPI: ?Mckenzie Gutierrez is a 88 y.o. female ? ?Chief Complaint  ?Patient presents with  ? Hypertension  ?  3 month f/u   ? Hyperlipidemia  ? Diabetes  ? ? ? ?AFIB ?Patient's daughter states that her AFIB is well controlled.  She continues to follow up with Cardiology. Had an updated ECHO done and waiting on results.  ? ?HYPERTENSION ?Hypertension status: controlled  ?Satisfied with current treatment? yes ?Duration of hypertension: years ?BP monitoring frequency:  rarely ?BP range: 137/52 ?BP medication side effects:  no ?Medication compliance: excellent compliance ?Previous BP meds:amlodipine, losartan and lasix. ?Aspirin: no ?Recurrent headaches: no ?Visual changes: no ?Palpitations: no ?Dyspnea: yes ?Chest pain: no ?Lower extremity edema: yes ?Dizzy/lightheaded: no ? ?HYPOTHYROIDISM ?Thyroid control status:controlled ?Satisfied with current treatment? no ?Medication side effects: no ?Medication compliance: excellent compliance ?Etiology of hypothyroidism:  ?Recent dose adjustment:no ?Fatigue: no ?Cold intolerance: no ?Heat intolerance: no ?Weight gain: no ?Weight loss: no ?Constipation: no ?Diarrhea/loose stools: no ?Palpitations: no ?Lower extremity edema: no ?Anxiety/depressed mood: no ? ?DEPRESSION ?Well controlled.  Related to fatigue per the patient.   ? ?[place] Office Visit from 12/20/2021 in [place]  ?PHQ-9 Total Score 0  ? ?  ? ? ?RECTAL BLEEDING ?Patient states she has been having rectal bleeding that has been ongoing.  She stopped taking ibuprofen and the bleeding has improved.  She does see Dr. Vicente Males and the blood is related to her hemorrhoids.  ? ?Relevant past medical, surgical, family and social history  reviewed and updated as indicated. Interim medical history since our last visit reviewed. ?Allergies and medications reviewed and updated. ? ?Review of Systems  ?Constitutional:  Positive for fatigue. Negative for fever and unexpected weight change.  ?Eyes:  Negative for visual disturbance.  ?Respiratory:  Negative for cough, chest tightness and shortness of breath.   ?Cardiovascular:  Negative for chest pain, palpitations and leg swelling.  ?Gastrointestinal:  Positive for anal bleeding.  ?Endocrine: Negative for cold intolerance.  ?Neurological:  Negative for dizziness and headaches.  ?Psychiatric/Behavioral:  Positive for dysphoric mood.   ? ?Per HPI unless specifically indicated above ? ?   ?Objective:  ?  ?BP (!) 156/73 (BP Location: Left Arm)   Pulse 72   Temp 97.6 ?F (36.4 ?C) (Oral)   Wt 191 lb 9.6 oz (86.9 kg)   LMP  (LMP Unknown)   SpO2 94%   BMI 30.01 kg/m?   ?Wt Readings from Last 3 Encounters:  ?12/20/21 191 lb 9.6 oz (86.9 kg)  ?09/19/21 192 lb 12.8 oz (87.5 kg)  ?05/11/21 193 lb 3.2 oz (87.6 kg)  ?  ?Physical Exam ?Vitals and nursing note reviewed.  ?Constitutional:   ?   General: She is not in acute distress. ?   Appearance: Normal appearance. She is normal weight. She is not ill-appearing, toxic-appearing or diaphoretic.  ?HENT:  ?   Head: Normocephalic.  ?   Right Ear: External ear normal.  ?   Left Ear: External ear normal.  ?   Nose: Nose normal.  ?   Mouth/Throat:  ?  Mouth: Mucous membranes are moist.  ?   Pharynx: Oropharynx is clear.  ?Eyes:  ?   General:     ?   Right eye: No discharge.     ?   Left eye: No discharge.  ?   Extraocular Movements: Extraocular movements intact.  ?   Conjunctiva/sclera: Conjunctivae normal.  ?   Pupils: Pupils are equal, round, and reactive to light.  ?Cardiovascular:  ?   Rate and Rhythm: Normal rate and regular rhythm.  ?   Heart sounds: Murmur heard.  ?Pulmonary:  ?   Effort: Pulmonary effort is normal. No respiratory distress.  ?   Breath sounds:  Normal breath sounds. No wheezing or rales.  ?Musculoskeletal:  ?   Cervical back: Normal range of motion and neck supple.  ?Skin: ?   General: Skin is warm and dry.  ?   Capillary Refill: Capillary refill takes less than 2 seconds.  ?Neurological:  ?   General: No focal deficit present.  ?   Mental Status: She is alert and oriented to person, place, and time. Mental status is at baseline.  ?Psychiatric:     ?   Mood and Affect: Mood normal.     ?   Behavior: Behavior normal.     ?   Thought Content: Thought content normal.     ?   Judgment: Judgment normal.  ? ? ?Results for orders placed or performed in visit on 12/20/21  ?Comp Met (CMET)  ?Result Value Ref Range  ? Glucose 112 (H) 70 - 99 mg/dL  ? BUN 11 10 - 36 mg/dL  ? Creatinine, Ser 0.84 0.57 - 1.00 mg/dL  ? eGFR 63 >59 mL/min/1.73  ? BUN/Creatinine Ratio 13 12 - 28  ? Sodium 147 (H) 134 - 144 mmol/L  ? Potassium 3.8 3.5 - 5.2 mmol/L  ? Chloride 104 96 - 106 mmol/L  ? CO2 26 20 - 29 mmol/L  ? Calcium 9.5 8.7 - 10.3 mg/dL  ? Total Protein 6.7 6.0 - 8.5 g/dL  ? Albumin 4.2 3.5 - 4.6 g/dL  ? Globulin, Total 2.5 1.5 - 4.5 g/dL  ? Albumin/Globulin Ratio 1.7 1.2 - 2.2  ? Bilirubin Total 0.3 0.0 - 1.2 mg/dL  ? Alkaline Phosphatase 74 44 - 121 IU/L  ? AST 23 0 - 40 IU/L  ? ALT 14 0 - 32 IU/L  ?CBC w/Diff  ?Result Value Ref Range  ? WBC 6.0 3.4 - 10.8 x10E3/uL  ? RBC 3.17 (L) 3.77 - 5.28 x10E6/uL  ? Hemoglobin 10.7 (L) 11.1 - 15.9 g/dL  ? Hematocrit 31.1 (L) 34.0 - 46.6 %  ? MCV 98 (H) 79 - 97 fL  ? MCH 33.8 (H) 26.6 - 33.0 pg  ? MCHC 34.4 31.5 - 35.7 g/dL  ? RDW 15.2 11.7 - 15.4 %  ? Platelets 216 150 - 450 x10E3/uL  ? Neutrophils 55 Not Estab. %  ? Lymphs 31 Not Estab. %  ? Monocytes 10 Not Estab. %  ? Eos 1 Not Estab. %  ? Basos 2 Not Estab. %  ? Neutrophils Absolute 3.4 1.4 - 7.0 x10E3/uL  ? Lymphocytes Absolute 1.9 0.7 - 3.1 x10E3/uL  ? Monocytes Absolute 0.6 0.1 - 0.9 x10E3/uL  ? EOS (ABSOLUTE) 0.1 0.0 - 0.4 x10E3/uL  ? Basophils Absolute 0.1 0.0 - 0.2 x10E3/uL   ? Immature Granulocytes 1 Not Estab. %  ? Immature Grans (Abs) 0.0 0.0 - 0.1 x10E3/uL  ? NRBC 1 (H) 0 -  0 %  ?TSH  ?Result Value Ref Range  ? TSH 3.150 0.450 - 4.500 uIU/mL  ?T4, free  ?Result Value Ref Range  ? Free T4 1.19 0.82 - 1.77 ng/dL  ? ?   ?Assessment & Plan:  ? ?Problem List Items Addressed This Visit   ? ?  ? Cardiovascular and Mediastinum  ? Hypertension - Primary  ?  Chronic. Elevated at visit today but she checks at home and it runs in the 130/70.Marland Kitchen  Continue with current medication regimen of Amlodipine, Lasix and Losartan.  Labs ordered today.  Return to clinic in 3 months for reevaluation.  Call sooner if concerns arise.  ? ? ?  ?  ? Relevant Orders  ? Comp Met (CMET) (Completed)  ? Atrial fibrillation ([place])  ?  Chronic.  Controlled.  Continue with current medication regimen of Eliquis 2.64m BID.  Continue to follow up with Cardiology.  Labs ordered today.  Return to clinic in 3 months for reevaluation.  Call sooner if concerns arise.  ? ? ?  ?  ?  ? Endocrine  ? Hypothyroid  ?  Chronic.  Controlled.  Continue with current medication regimen.  Labs ordered today.  Return to clinic in 3 months for reevaluation.  Call sooner if concerns arise.  ? ? ?  ?  ? Relevant Orders  ? TSH (Completed)  ? T4, free (Completed)  ?  ? Hematopoietic and Hemostatic  ? [place] thromb[place]  ?  Reassured patient. Patient is also on Eliquis, precautions discussed. Labs ordered today. May need to follow up with GI if H/H decrease.  Will make recommendations based on lab results. ? ?  ?  ? Relevant Orders  ? CBC w/Diff (Completed)  ?  ? Other  ? Major depression in complete remission ([place]  ?  Chronic.  Controlled.  Continue with current medication regimen on Zoloft 279m  Labs ordered today.  Return to clinic in 3 months for reevaluation.  Call sooner if concerns arise.  ? ? ?  ?  ? Relevant Medications  ? sertraline (ZOLOFT) 25 MG tablet  ?  ? ?Follow up plan: ?Return in about 3 months (around 03/21/2022) for  HTN, HLD, DM2 FU. ? ? ? ? ? ? ?

## 2021-12-20 NOTE — Telephone Encounter (Signed)
Requested Prescriptions  ?Pending Prescriptions Disp Refills  ?? amLODipine (NORVASC) 5 MG tablet [Pharmacy Med Name: AMLODIPINE BESYLATE 5 MG TAB] 90 tablet 0  ?  Sig: TAKE 1 TABLET BY MOUTH DAILY **NEED APPOINTMENT BEFORE ADDITIONAL REFILLS**  ?  ? Cardiovascular: Calcium Channel Blockers 2 Failed - 12/20/2021  1:43 PM  ?  ?  Failed - Last BP in normal range  ?  BP Readings from Last 1 Encounters:  ?12/20/21 (!) 156/73  ?   ?  ?  Passed - Last Heart Rate in normal range  ?  Pulse Readings from Last 1 Encounters:  ?12/20/21 72  ?   ?  ?  Passed - Valid encounter within last 6 months  ?  Recent Outpatient Visits   ?      ? Today Primary hypertension  ? [place], NP  ? 3 months ago Exudative age-related macular degeneration of left eye with active choroidal neovascularization ([place])  ? [place], NP  ? 4 months ago Viral upper respiratory tract infection  ? [place], NP  ? 7 months ago Anemia, unspecified type  ? [place], NP  ? 9 months ago Primary hypertension  ? [facility] Jon Billings, NP  ?  ?  ?Future Appointments   ?        ? In 1 month  [place], [facility]  ? In 3 months Jon Billings, NP [facility], [facility]  ?  ? ?  ?  ?  ? ? ?

## 2021-12-21 ENCOUNTER — Telehealth: Payer: Self-pay | Admitting: Nurse Practitioner

## 2021-12-21 DIAGNOSIS — D6869 Other thrombophilia: Secondary | ICD-10-CM

## 2021-12-21 LAB — CBC WITH DIFFERENTIAL/PLATELET
Basophils Absolute: 0.1 10*3/uL (ref 0.0–0.2)
Basos: 2 %
EOS (ABSOLUTE): 0.1 10*3/uL (ref 0.0–0.4)
Eos: 1 %
Hematocrit: 31.1 % — ABNORMAL LOW (ref 34.0–46.6)
Hemoglobin: 10.7 g/dL — ABNORMAL LOW (ref 11.1–15.9)
Immature Grans (Abs): 0 10*3/uL (ref 0.0–0.1)
Immature Granulocytes: 1 %
Lymphocytes Absolute: 1.9 10*3/uL (ref 0.7–3.1)
Lymphs: 31 %
MCH: 33.8 pg — ABNORMAL HIGH (ref 26.6–33.0)
MCHC: 34.4 g/dL (ref 31.5–35.7)
MCV: 98 fL — ABNORMAL HIGH (ref 79–97)
Monocytes Absolute: 0.6 10*3/uL (ref 0.1–0.9)
Monocytes: 10 %
NRBC: 1 % — ABNORMAL HIGH (ref 0–0)
Neutrophils Absolute: 3.4 10*3/uL (ref 1.4–7.0)
Neutrophils: 55 %
Platelets: 216 10*3/uL (ref 150–450)
RBC: 3.17 x10E6/uL — ABNORMAL LOW (ref 3.77–5.28)
RDW: 15.2 % (ref 11.7–15.4)
WBC: 6 10*3/uL (ref 3.4–10.8)

## 2021-12-21 LAB — COMPREHENSIVE METABOLIC PANEL
ALT: 14 IU/L (ref 0–32)
AST: 23 IU/L (ref 0–40)
Albumin/Globulin Ratio: 1.7 (ref 1.2–2.2)
Albumin: 4.2 g/dL (ref 3.5–4.6)
Alkaline Phosphatase: 74 IU/L (ref 44–121)
BUN/Creatinine Ratio: 13 (ref 12–28)
BUN: 11 mg/dL (ref 10–36)
Bilirubin Total: 0.3 mg/dL (ref 0.0–1.2)
CO2: 26 mmol/L (ref 20–29)
Calcium: 9.5 mg/dL (ref 8.7–10.3)
Chloride: 104 mmol/L (ref 96–106)
Creatinine, Ser: 0.84 mg/dL (ref 0.57–1.00)
Globulin, Total: 2.5 g/dL (ref 1.5–4.5)
Glucose: 112 mg/dL — ABNORMAL HIGH (ref 70–99)
Potassium: 3.8 mmol/L (ref 3.5–5.2)
Sodium: 147 mmol/L — ABNORMAL HIGH (ref 134–144)
Total Protein: 6.7 g/dL (ref 6.0–8.5)
eGFR: 63 mL/min/{1.73_m2} (ref >59)

## 2021-12-21 LAB — TSH: TSH: 3.15 u[IU]/mL (ref 0.450–4.500)

## 2021-12-21 LAB — T4, FREE: Free T4: 1.19 ng/dL (ref 0.82–1.77)

## 2021-12-21 NOTE — Assessment & Plan Note (Signed)
Chronic. Elevated at visit today but she checks at home and it runs in the 130/70.Marland Kitchen  Continue with current medication regimen of Amlodipine, Lasix and Losartan.  Labs ordered today.  Return to clinic in 3 months for reevaluation.  Call sooner if concerns arise.  ? ?

## 2021-12-21 NOTE — Progress Notes (Signed)
Please let patient's daughter know that overall her lab work looks good.  Her complete blood count did decrease some.  I would like her to come back in 2 weeks as a nurse visit to recheck the cbc and make sure it isn't decreasing more.  If it is, she will need to go back and see GI.  Otherwise, continue with her current medication regimen.

## 2021-12-21 NOTE — Assessment & Plan Note (Signed)
Chronic.  Controlled.  Continue with current medication regimen of Eliquis 2.'5mg'$  BID.  Continue to follow up with Cardiology.  Labs ordered today.  Return to clinic in 3 months for reevaluation.  Call sooner if concerns arise.  ? ?

## 2021-12-21 NOTE — Assessment & Plan Note (Signed)
Chronic.  Controlled.  Continue with current medication regimen on Zoloft '25mg'$ .  Labs ordered today.  Return to clinic in 3 months for reevaluation.  Call sooner if concerns arise.  ? ?

## 2021-12-21 NOTE — Assessment & Plan Note (Signed)
Chronic.  Controlled.  Continue with current medication regimen.  Labs ordered today.  Return to clinic in 3 months for reevaluation.  Call sooner if concerns arise.   

## 2021-12-21 NOTE — Telephone Encounter (Signed)
Please have daughter make lab appt for repeat CBC in 2 weeks. ?

## 2021-12-21 NOTE — Assessment & Plan Note (Signed)
Reassured patient. Patient is also on Eliquis, precautions discussed. Labs ordered today. May need to follow up with GI if H/H decrease.  Will make recommendations based on lab results. ?

## 2021-12-21 NOTE — Telephone Encounter (Signed)
Order placed

## 2021-12-21 NOTE — Telephone Encounter (Signed)
[place] message sent asking patient to call back to schedule a Lab only appointment. ?

## 2021-12-21 NOTE — Telephone Encounter (Signed)
Copied from [place] [phone]. Topic: General - Other ?>> Dec 21, 2021  9:17 AM Yvette Rack wrote: ?Reason for CRM: Pt daughter [name] stated she received a message to schedule a nurse appt for 2 weeks out to redo labs but there are no lab orders or any nurse only appts available for week of 01/03/22. [name] requests return call. Cb# [phone] ?

## 2022-01-05 ENCOUNTER — Other Ambulatory Visit: Payer: PPO

## 2022-01-05 DIAGNOSIS — D500 Iron deficiency anemia secondary to blood loss (chronic): Secondary | ICD-10-CM

## 2022-01-05 DIAGNOSIS — D6869 Other thrombophilia: Secondary | ICD-10-CM

## 2022-01-06 ENCOUNTER — Encounter: Payer: Self-pay | Admitting: Nurse Practitioner

## 2022-01-06 LAB — CBC WITH DIFFERENTIAL/PLATELET
Basophils Absolute: 0.1 10*3/uL (ref 0.0–0.2)
Basos: 1 %
EOS (ABSOLUTE): 0.1 10*3/uL (ref 0.0–0.4)
Eos: 1 %
Hematocrit: 27.5 % — ABNORMAL LOW (ref 34.0–46.6)
Hemoglobin: 9.2 g/dL — ABNORMAL LOW (ref 11.1–15.9)
Immature Grans (Abs): 0 10*3/uL (ref 0.0–0.1)
Immature Granulocytes: 1 %
Lymphocytes Absolute: 2.2 10*3/uL (ref 0.7–3.1)
Lymphs: 34 %
MCH: 33.7 pg — ABNORMAL HIGH (ref 26.6–33.0)
MCHC: 33.5 g/dL (ref 31.5–35.7)
MCV: 101 fL — ABNORMAL HIGH (ref 79–97)
Monocytes Absolute: 0.6 10*3/uL (ref 0.1–0.9)
Monocytes: 9 %
NRBC: 1 % — ABNORMAL HIGH (ref 0–0)
Neutrophils Absolute: 3.6 10*3/uL (ref 1.4–7.0)
Neutrophils: 54 %
Platelets: 221 10*3/uL (ref 150–450)
RBC: 2.73 x10E6/uL — CL (ref 3.77–5.28)
RDW: 15.7 % — ABNORMAL HIGH (ref 11.7–15.4)
WBC: 6.5 10*3/uL (ref 3.4–10.8)

## 2022-01-06 NOTE — Progress Notes (Signed)
Please let patient's daughter, [name], know the patient's hemoglobin and hematocrit decreased again.  She is likely bleeding in her intestines.  I would like her to make an appointment with GI as soon as possible.  I have also placed an urgent referral for hematology as she may need an iron infusion while we are finding the source of the bleed.   ? ?If it is possible, I would like her to come back and check a B12.  A supplement may also help with the anemia she is experiencing.

## 2022-01-09 ENCOUNTER — Other Ambulatory Visit: Payer: PPO

## 2022-01-09 DIAGNOSIS — D500 Iron deficiency anemia secondary to blood loss (chronic): Secondary | ICD-10-CM

## 2022-01-09 DIAGNOSIS — D6869 Other thrombophilia: Secondary | ICD-10-CM | POA: Diagnosis not present

## 2022-01-10 LAB — VITAMIN B12: Vitamin B-12: 910 pg/mL (ref 232–1245)

## 2022-01-10 NOTE — Progress Notes (Signed)
Please let patient's daughter know that her B12 was normal and not the source of her low hemoglobin and hematocrit.  Make sure she keeps the upcoming appointment with GI and Hematology.

## 2022-01-11 ENCOUNTER — Other Ambulatory Visit: Payer: Self-pay | Admitting: Gastroenterology

## 2022-01-11 ENCOUNTER — Ambulatory Visit (INDEPENDENT_AMBULATORY_CARE_PROVIDER_SITE_OTHER): Payer: PPO | Admitting: Gastroenterology

## 2022-01-11 ENCOUNTER — Encounter: Payer: Self-pay | Admitting: Nurse Practitioner

## 2022-01-11 DIAGNOSIS — K648 Other hemorrhoids: Secondary | ICD-10-CM

## 2022-01-11 DIAGNOSIS — K625 Hemorrhage of anus and rectum: Secondary | ICD-10-CM | POA: Diagnosis not present

## 2022-01-11 DIAGNOSIS — D539 Nutritional anemia, unspecified: Secondary | ICD-10-CM

## 2022-01-11 NOTE — Progress Notes (Signed)
?  ?Mckenzie Bellows MD, MRCP(U.K) ?[address]  ?[address]  ?[place], [place] [address]  ?Main: [phone]  ?Fax: [phone] ? ? ?Primary Care Physician: Mckenzie Billings, NP ? ?Primary Gastroenterologist:  Dr. Jonathon Gutierrez  ? ?Chief Complaint  ?Patient presents with  ? Rectal Bleeding  ? ? ?HPI: Mckenzie Gutierrez is a 88 y.o. female ?Summary of history : ?  ?She was initially referred and seen on 05/03/2020 for rectal bleeding.  In June 2021 her hemoglobin was 12.8 g.  She has been on Eliquis for atrial fibrillation.  She is very hard of hearing.  The bleeding was painless and noticed blood in the toilet bowl.  She has suffered from constipation in the past and prior agents being used including Metamucil, Colace and MiraLAX.   Due to her age the relative risks of anesthesia it was decided the best option would be to perform an unsedated flexible sigmoidoscopy to evaluate the rectum and sigmoid colon to rule out any masslike lesion.  She was advised on conservative management for her internal hemorrhoids. ?  ?05/14/2020 flexible sigmoidoscopy: Grade 2 internal hemorrhoids were noted.  No other bleeding from any other site was noted.  ?  ?Interval history   02/17/2021-01/11/2022 ? ?Baseline hemoglobin around 11.1 g.  Over the last few weeks hemoglobin between 9 and 10 g with MCV of 101.  B12 normal.  T4 normal. ? ?  ?She states that she has been having persistent rectal bleeding.  Usually after bowel movement bright red blood on the tissue paper and in the toilet bowl.  No pain during the process.  No NSAID use.  No nosebleeds.  No hematemesis. ? ? ? ?Current Outpatient Medications  ?Medication Sig Dispense Refill  ? albuterol (VENTOLIN HFA) 108 (90 Base) MCG/ACT inhaler INHALE 2 PUFFS INTO THE LUNGS EVERY SIX HOURS AS NEEDED FOR WHEEZING OR SHORTNESS OF BREATH (Patient not taking: Reported on 09/19/2021) 8.5 g 1  ? amLODipine (NORVASC) 5 MG tablet TAKE 1 TABLET BY MOUTH DAILY **NEED APPOINTMENT BEFORE ADDITIONAL  REFILLS** 90 tablet 0  ? apixaban (ELIQUIS) 2.5 MG TABS tablet Take 2.5 mg by mouth 2 (two) times daily.    ? beta carotene w/minerals (OCUVITE) tablet Take 1 tablet by mouth daily.    ? erythromycin ophthalmic ointment SMARTSIG:sparingly In Eye(s) 3 Times Daily    ? ferrous sulfate 325 (65 FE) MG tablet Take 325 mg by mouth at bedtime.    ? furosemide (LASIX) 20 MG tablet TAKE ONE TABLET EVERY DAY 90 tablet 0  ? hydrocortisone (ANUSOL-HC) 25 MG suppository Place 1 suppository (25 mg total) rectally 2 (two) times daily. 12 suppository 1  ? levothyroxine (SYNTHROID) 75 MCG tablet TAKE 1 TABLET EVERY DAY ON EMPTY STOMACHWITH A GLASS OF WATER AT LEAST 30-60 MINBEFORE BREAKFAST 90 tablet 1  ? LINZESS 145 MCG CAPS capsule TAKE ONE CAPSULE EACH MORNING BEFORE BREAKFAST 90 capsule 1  ? losartan (COZAAR) 100 MG tablet TAKE ONE TABLET EVERY DAY 90 tablet 1  ? Multiple Vitamin (MULTIVITAMIN) tablet Take 1 tablet by mouth daily.    ? pantoprazole (PROTONIX) 20 MG tablet TAKE 1 TABLET BY MOUTH DAILY 90 tablet 0  ? polyethylene glycol (MIRALAX / GLYCOLAX) 17 g packet Take 17 g by mouth daily.    ? sertraline (ZOLOFT) 25 MG tablet Take 1 tablet (25 mg total) by mouth daily. 90 tablet 1  ? ?No current facility-administered medications for this visit.  ? ? ?Allergies as of 01/11/2022 -  Review Complete 01/11/2022  ?Allergen Reaction Noted  ? Celebrex [celecoxib] Other (See Comments) 01/11/2016  ? Codeine sulfate Other (See Comments) 07/12/2015  ? Hydrochlorothiazide  07/12/2015  ? Hytrin [terazosin]  07/12/2015  ? Plendil [felodipine] Diarrhea and Nausea Only 07/12/2015  ? Nitrofuran derivatives Swelling and Rash 07/12/2015  ? Nitrofurantoin Rash and Swelling 07/12/2015  ? ? ?ROS: ? ?General: Negative for anorexia, weight loss, fever, chills, fatigue, weakness. ?ENT: Negative for hoarseness, difficulty swallowing , nasal congestion. ?CV: Negative for chest pain, angina, palpitations, dyspnea on exertion, peripheral edema.   ?Respiratory: Negative for dyspnea at rest, dyspnea on exertion, cough, sputum, wheezing.  ?GI: See history of present illness. ?GU:  Negative for dysuria, hematuria, urinary incontinence, urinary frequency, nocturnal urination.  ?Endo: Negative for unusual weight change.  ?  ?Physical Examination: ? ? LMP  (LMP Unknown)  ?Rectal exam prolapsing second-degree hemorrhoids noted with stigmata of recent bleed.  No masses palpable on digital examination ?PROCEDURE NOTE: ?The patient presents with symptomatic grade 1 hemorrhoids, unresponsive to maximal medical therapy, requesting rubber band ligation of his/her hemorrhoidal disease.  All risks, benefits and alternative forms of therapy were described and informed consent was obtained. ? ?In the Left Lateral Decubitus position (if anoscopy is performed) anoscopic examination revealed grade 2 hemorrhoids in the all position(s).  ? ?The decision was made to band the LL internal hemorrhoid, and the [place] O?Regan System was used to perform band ligation without complication.  Digital anorectal examination was then performed to assure proper positioning of the band, and to adjust the banded tissue as required.  The patient was discharged home without pain or other issues.  Dietary and behavioral recommendations were given and (if necessary - prescriptions were given), along with follow-up instructions.  The patient will return 4 weeks for follow-up and possible additional banding as required. ? ?No complications were encountered and the patient tolerated the procedure well. ? ? ? ?Imaging Studies: ?No results found. ? ?Assessment and Plan:  ? ?Mckenzie Gutierrez is a 88 y.o. y/o female here to follow-up for rectal bleeding.  Flexible sigmoidoscopy showed internal hemorrhoids previously.  She today presents with worsening of anemia and history of rectal bleeding which is painless and bright red in color very likely related to internal hemorrhoids.  Noticed new macrocytic anemia  on her labs.  No reticulocyte count of folic acid checked.  I explained to her that the anemia most likely could be from the hemorrhoids but other differentials to consider would be myelodysplastic syndrome as well as neoplasm.  In terms of a neoplasm such as a colonic neoplasm evaluation would require a colonoscopy but at her age the risks would definitely be higher and we have talked about this in depth.  Felt probably safest option is to treat the hemorrhoids watch closely and if you have no other option at a later point then consider a colonoscopy. ? ?Plan ?1.  Check iron studies, reticulocyte count.  She has a macrocytic anemia. ?2.  Return in 4 weeks for hemorrhoidal banding.  She has been using Preparation H on a daily basis advised to avoid using that.  She does have a sitz bath but does not use it. ? ?Dr Mckenzie Bellows  MD,MRCP [facility]) ?Follow up in 4 weeks ?

## 2022-01-12 LAB — FOLATE: Folate: >20 ng/mL (ref >3.0)

## 2022-01-12 LAB — RETICULOCYTES: Retic Ct Pct: 3.7 % — ABNORMAL HIGH (ref 0.6–2.6)

## 2022-01-12 LAB — HEMOGLOBIN: Hemoglobin: 9.9 g/dL — ABNORMAL LOW (ref 11.1–15.9)

## 2022-01-13 ENCOUNTER — Encounter: Payer: Self-pay | Admitting: Oncology

## 2022-01-13 ENCOUNTER — Encounter: Payer: PPO | Admitting: Oncology

## 2022-01-13 ENCOUNTER — Inpatient Hospital Stay: Payer: PPO

## 2022-01-13 ENCOUNTER — Other Ambulatory Visit: Payer: PPO

## 2022-01-13 ENCOUNTER — Inpatient Hospital Stay: Payer: PPO | Attending: Oncology | Admitting: Oncology

## 2022-01-13 VITALS — BP 119/60 | HR 72 | Temp 98.2°F | Resp 18 | Wt 187.7 lb

## 2022-01-13 DIAGNOSIS — D472 Monoclonal gammopathy: Secondary | ICD-10-CM | POA: Diagnosis not present

## 2022-01-13 DIAGNOSIS — D539 Nutritional anemia, unspecified: Secondary | ICD-10-CM | POA: Insufficient documentation

## 2022-01-13 DIAGNOSIS — K219 Gastro-esophageal reflux disease without esophagitis: Secondary | ICD-10-CM | POA: Insufficient documentation

## 2022-01-13 DIAGNOSIS — Z79899 Other long term (current) drug therapy: Secondary | ICD-10-CM | POA: Insufficient documentation

## 2022-01-13 DIAGNOSIS — I10 Essential (primary) hypertension: Secondary | ICD-10-CM | POA: Insufficient documentation

## 2022-01-13 DIAGNOSIS — R5383 Other fatigue: Secondary | ICD-10-CM | POA: Insufficient documentation

## 2022-01-13 DIAGNOSIS — M810 Age-related osteoporosis without current pathological fracture: Secondary | ICD-10-CM | POA: Insufficient documentation

## 2022-01-13 DIAGNOSIS — D649 Anemia, unspecified: Secondary | ICD-10-CM | POA: Diagnosis not present

## 2022-01-13 DIAGNOSIS — K5900 Constipation, unspecified: Secondary | ICD-10-CM | POA: Insufficient documentation

## 2022-01-13 DIAGNOSIS — M545 Low back pain, unspecified: Secondary | ICD-10-CM | POA: Diagnosis not present

## 2022-01-13 DIAGNOSIS — E785 Hyperlipidemia, unspecified: Secondary | ICD-10-CM | POA: Insufficient documentation

## 2022-01-13 DIAGNOSIS — Z7901 Long term (current) use of anticoagulants: Secondary | ICD-10-CM | POA: Diagnosis not present

## 2022-01-13 DIAGNOSIS — I4891 Unspecified atrial fibrillation: Secondary | ICD-10-CM | POA: Diagnosis not present

## 2022-01-13 DIAGNOSIS — K625 Hemorrhage of anus and rectum: Secondary | ICD-10-CM | POA: Insufficient documentation

## 2022-01-13 LAB — HEPATIC FUNCTION PANEL
ALT: 14 U/L (ref 0–44)
AST: 29 U/L (ref 15–41)
Albumin: 3.9 g/dL (ref 3.5–5.0)
Alkaline Phosphatase: 60 U/L (ref 38–126)
Bilirubin, Direct: <0.1 mg/dL (ref 0.0–0.2)
Total Bilirubin: 0.6 mg/dL (ref 0.3–1.2)
Total Protein: 6.7 g/dL (ref 6.5–8.1)

## 2022-01-13 LAB — CBC WITH DIFFERENTIAL/PLATELET
Abs Immature Granulocytes: 0.02 10*3/uL (ref 0.00–0.07)
Basophils Absolute: 0.1 10*3/uL (ref 0.0–0.1)
Basophils Relative: 1 %
Eosinophils Absolute: 0.1 10*3/uL (ref 0.0–0.5)
Eosinophils Relative: 1 %
HCT: 30.7 % — ABNORMAL LOW (ref 36.0–46.0)
Hemoglobin: 9.8 g/dL — ABNORMAL LOW (ref 12.0–15.0)
Immature Granulocytes: 0 %
Lymphocytes Relative: 26 %
Lymphs Abs: 1.9 10*3/uL (ref 0.7–4.0)
MCH: 33.6 pg (ref 26.0–34.0)
MCHC: 31.9 g/dL (ref 30.0–36.0)
MCV: 105.1 fL — ABNORMAL HIGH (ref 80.0–100.0)
Monocytes Absolute: 0.6 10*3/uL (ref 0.1–1.0)
Monocytes Relative: 8 %
Neutro Abs: 4.6 10*3/uL (ref 1.7–7.7)
Neutrophils Relative %: 64 %
Platelets: 222 10*3/uL (ref 150–400)
RBC: 2.92 MIL/uL — ABNORMAL LOW (ref 3.87–5.11)
RDW: 17.7 % — ABNORMAL HIGH (ref 11.5–15.5)
WBC: 7.2 10*3/uL (ref 4.0–10.5)
nRBC: 1.1 % — ABNORMAL HIGH (ref 0.0–0.2)

## 2022-01-13 LAB — TSH: TSH: 1.961 u[IU]/mL (ref 0.350–4.500)

## 2022-01-13 LAB — IRON AND TIBC
Iron: 55 ug/dL (ref 28–170)
Saturation Ratios: 17 % (ref 10.4–31.8)
TIBC: 330 ug/dL (ref 250–450)
UIBC: 275 ug/dL

## 2022-01-13 LAB — RETIC PANEL
Immature Retic Fract: 25.5 % — ABNORMAL HIGH (ref 2.3–15.9)
RBC.: 2.91 MIL/uL — ABNORMAL LOW (ref 3.87–5.11)
Retic Count, Absolute: 106.2 10*3/uL (ref 19.0–186.0)
Retic Ct Pct: 3.7 % — ABNORMAL HIGH (ref 0.4–3.1)
Reticulocyte Hemoglobin: 33.4 pg (ref >27.9)

## 2022-01-13 LAB — FERRITIN: Ferritin: 33 ng/mL (ref 11–307)

## 2022-01-13 LAB — TECHNOLOGIST SMEAR REVIEW
Plt Morphology: NORMAL
RBC MORPHOLOGY: NORMAL
WBC MORPHOLOGY: NORMAL

## 2022-01-13 LAB — LACTATE DEHYDROGENASE: LDH: 127 U/L (ref 98–192)

## 2022-01-13 NOTE — Progress Notes (Signed)
Patient here to establish care for anemia 

## 2022-01-14 LAB — HAPTOGLOBIN: Haptoglobin: 126 mg/dL (ref 41–333)

## 2022-01-14 NOTE — Progress Notes (Signed)
?Hematology/Oncology Consult note ?[phone] [phone] ?  ? ?   ? ? ?Patient Care Team: ?Jon Billings, NP as PCP - General ?Conception Chancy, DDS as Referring Physician (Dentistry) ?Leandrew Koyanagi, MD as Referring Physician (Ophthalmology) ? ?REFERRING PROVIDER: ?Jon Billings, NP  ?CHIEF COMPLAINTS/REASON FOR VISIT:  ?Evaluation of anemia ? ?HISTORY OF PRESENTING ILLNESS:  ? ?Mckenzie Gutierrez is a  88 y.o.  female with PMH listed below was seen in consultation at the request of  Jon Billings, NP  for evaluation of  anemia.  ? ?Patient is on Eliquis for A Fib.  ?+ rectal bleeding  was seen by Dr.Anna and had flexible sigmoidoscopy on 05/14/20 and was found to have interval hemorrhoids. She had one of the hemorrhoid banded.  ?Her baseline hemoglobin was around 11. It was noted recently that her hemoglobin has decreased to 9-10s. MCV is 101 ?Patient was referred to establish care with hematology for further evaluation.  ?She is on oral ferrous sulfate once daily since last fall. + constipation, she takes laxatives.  ?Denies fever, chills, night sweats, abdominal pain ?+ fatigue.  ?She has lost 5 pounds since Jan 2023.  ?+ hard hearing.  ? ? ?Review of Systems  ?Constitutional:  Positive for fatigue. Negative for chills and fever.  ?HENT:   Positive for hearing loss. Negative for voice change.   ?Eyes:  Negative for eye problems.  ?Respiratory:  Negative for chest tightness and cough.   ?Cardiovascular:  Negative for chest pain.  ?Gastrointestinal:  Positive for blood in stool. Negative for abdominal distention and abdominal pain.  ?Endocrine: Negative for hot flashes.  ?Genitourinary:  Negative for difficulty urinating and frequency.   ?Musculoskeletal:  Negative for arthralgias.  ?Skin:  Negative for itching and rash.  ?Neurological:  Negative for extremity weakness.  ?Hematological:  Negative for adenopathy.  ?Psychiatric/Behavioral:  Negative for confusion.   ? ?MEDICAL  HISTORY:  ?Past Medical History:  ?Diagnosis Date  ? Anxiety   ? Atrial premature beats   ? Bradycardia   ? Carotid stenosis   ? Depression   ? GERD (gastroesophageal reflux disease)   ? Hyperlipidemia   ? Hypertension   ? Low back pain   ? Osteoporosis   ? Thyroid disease   ? Urinary frequency   ? ? ?SURGICAL HISTORY: ?Past Surgical History:  ?Procedure Laterality Date  ? andarteretomy    ? CARPAL TUNNEL RELEASE    ? FLEXIBLE SIGMOIDOSCOPY N/A 05/13/2020  ? Procedure: FLEXIBLE SIGMOIDOSCOPY;  Surgeon: [name], MD;  Location: [facility] ENDOSCOPY;  Service: Gastroenterology;  Laterality: N/A;  Unsedated  ? WRIST SURGERY    ? ? ?SOCIAL HISTORY: ?Social History  ? ?Socioeconomic History  ? Marital status: Widowed  ?  Spouse name: Not on file  ? Number of children: Not on file  ? Years of education: Not on file  ? Highest education level: Not on file  ?Occupational History  ? Not on file  ?Tobacco Use  ? Smoking status: Never  ? Smokeless tobacco: Never  ?Vaping Use  ? Vaping Use: Never used  ?Substance and Sexual Activity  ? Alcohol use: No  ?  Alcohol/week: 0.0 standard drinks  ? Drug use: No  ? Sexual activity: Not Currently  ?Other Topics Concern  ? Not on file  ?Social History Narrative  ? Not on file  ? ?Social Determinants of Health  ? ?Financial Resource Strain: Low Risk   ? Difficulty of Paying Living Expenses: Not hard at all  ?  Food Insecurity: No Food Insecurity  ? Worried About Charity fundraiser in the Last Year: Never true  ? Ran Out of Food in the Last Year: Never true  ?Transportation Needs: No Transportation Needs  ? Lack of Transportation (Medical): No  ? Lack of Transportation (Non-Medical): No  ?Physical Activity: Inactive  ? Days of Exercise per Week: 0 days  ? Minutes of Exercise per Session: 0 min  ?Stress: No Stress Concern Present  ? Feeling of Stress : Not at all  ?Social Connections: Not on file  ?Intimate Partner Violence: Not on file  ? ? ?FAMILY HISTORY: ?Family History  ?Problem Relation  Age of Onset  ? Heart disease Mother   ? Stroke Father   ? Diabetes Father   ? Stroke Sister   ? ? ?ALLERGIES:  is allergic to celebrex [celecoxib], codeine sulfate, hydrochlorothiazide, hytrin [terazosin], plendil [felodipine], nitrofuran derivatives, and nitrofurantoin. ? ?MEDICATIONS:  ?Current Outpatient Medications  ?Medication Sig Dispense Refill  ? albuterol (VENTOLIN HFA) 108 (90 Base) MCG/ACT inhaler INHALE 2 PUFFS INTO THE LUNGS EVERY SIX HOURS AS NEEDED FOR WHEEZING OR SHORTNESS OF BREATH 8.5 g 1  ? amLODipine (NORVASC) 5 MG tablet TAKE 1 TABLET BY MOUTH DAILY **NEED APPOINTMENT BEFORE ADDITIONAL REFILLS** 90 tablet 0  ? apixaban (ELIQUIS) 2.5 MG TABS tablet Take 2.5 mg by mouth 2 (two) times daily.    ? beta carotene w/minerals (OCUVITE) tablet Take 1 tablet by mouth daily.    ? ferrous sulfate 325 (65 FE) MG tablet Take 325 mg by mouth at bedtime.    ? furosemide (LASIX) 20 MG tablet TAKE ONE TABLET EVERY DAY 90 tablet 0  ? levothyroxine (SYNTHROID) 75 MCG tablet TAKE 1 TABLET EVERY DAY ON EMPTY STOMACHWITH A GLASS OF WATER AT LEAST 30-60 MINBEFORE BREAKFAST 90 tablet 1  ? LINZESS 145 MCG CAPS capsule TAKE ONE CAPSULE EACH MORNING BEFORE BREAKFAST 90 capsule 1  ? losartan (COZAAR) 100 MG tablet TAKE ONE TABLET EVERY DAY 90 tablet 1  ? Multiple Vitamin (MULTIVITAMIN) tablet Take 1 tablet by mouth daily.    ? pantoprazole (PROTONIX) 20 MG tablet TAKE 1 TABLET BY MOUTH DAILY 90 tablet 0  ? polyethylene glycol (MIRALAX / GLYCOLAX) 17 g packet Take 17 g by mouth daily.    ? sertraline (ZOLOFT) 25 MG tablet Take 1 tablet (25 mg total) by mouth daily. 90 tablet 1  ? erythromycin ophthalmic ointment SMARTSIG:sparingly In Eye(s) 3 Times Daily (Patient not taking: Reported on 01/13/2022)    ? hydrocortisone (ANUSOL-HC) 25 MG suppository Place 1 suppository (25 mg total) rectally 2 (two) times daily. (Patient not taking: Reported on 01/13/2022) 12 suppository 1  ? ?No current facility-administered medications for  this visit.  ? ? ? ?PHYSICAL EXAMINATION: ?ECOG PERFORMANCE STATUS: 1 - Symptomatic but completely ambulatory ?Vitals:  ? 01/13/22 1251  ?BP: 119/60  ?Pulse: 72  ?Resp: 18  ?Temp: 98.2 ?F (36.8 ?C)  ? ?Filed Weights  ? 01/13/22 1251  ?Weight: 187 lb 11.2 oz (85.1 kg)  ? ? ?Physical Exam ?Constitutional:   ?   General: She is not in acute distress. ?   Comments: Ambulates independently.   ?HENT:  ?   Head: Normocephalic and atraumatic.  ?Eyes:  ?   General: No scleral icterus. ?Cardiovascular:  ?   Rate and Rhythm: Normal rate and regular rhythm.  ?   Heart sounds: Murmur heard.  ?Pulmonary:  ?   Effort: Pulmonary effort is normal. No respiratory distress.  ?  Breath sounds: No wheezing.  ?Abdominal:  ?   General: Bowel sounds are normal. There is no distension.  ?   Palpations: Abdomen is soft.  ?Musculoskeletal:     ?   General: No deformity. Normal range of motion.  ?   Cervical back: Normal range of motion and neck supple.  ?Skin: ?   General: Skin is warm and dry.  ?   Coloration: Skin is pale.  ?   Findings: No erythema or rash.  ?Neurological:  ?   Mental Status: She is alert. Mental status is at baseline.  ?Psychiatric:     ?   Mood and Affect: Mood normal.  ? ? ?LABORATORY DATA:  ?I have reviewed the data as listed ?Lab Results  ?Component Value Date  ? WBC 7.2 01/13/2022  ? HGB 9.8 (L) 01/13/2022  ? HCT 30.7 (L) 01/13/2022  ? MCV 105.1 (H) 01/13/2022  ? PLT 222 01/13/2022  ? ?Recent Labs  ?  03/23/21 ?1106 09/19/21 ?1201 12/20/21 ?1131 01/13/22 ?1325  ?NA 139 140 147*  --   ?K 4.2 4.0 3.8  --   ?CL 99 101 104  --   ?CO2 '24 25 26  '$ --   ?GLUCOSE 110* 101* 112*  --   ?BUN '15 15 11  '$ --   ?CREATININE 0.88 0.87 0.84  --   ?CALCIUM 9.4 9.4 9.5  --   ?PROT 6.7 6.8 6.7 6.7  ?ALBUMIN 4.3 4.2 4.2 3.9  ?AST '23 23 23 29  '$ ?ALT '11 11 14 14  '$ ?ALKPHOS 76 80 74 60  ?BILITOT 0.3 0.4 0.3 0.6  ?BILIDIR  --   --   --  <0.1  ?IBILI  --   --   --  NOT CALCULATED  ? ?Iron/TIBC/Ferritin/ %Sat ?   ?Component Value Date/Time  ? IRON  55 01/13/2022 1325  ? IRON 32 03/23/2021 1106  ? TIBC 330 01/13/2022 1325  ? TIBC 352 03/23/2021 1106  ? FERRITIN 33 01/13/2022 1325  ? FERRITIN 17 03/23/2021 1106  ? IRONPCTSAT 17 01/13/2022 1325  ? IRONPCT

## 2022-01-15 LAB — COPPER, SERUM: Copper: 99 ug/dL (ref 80–158)

## 2022-01-16 LAB — KAPPA/LAMBDA LIGHT CHAINS
Kappa free light chain: 32.2 mg/L — ABNORMAL HIGH (ref 3.3–19.4)
Kappa, lambda light chain ratio: 2.06 — ABNORMAL HIGH (ref 0.26–1.65)
Lambda free light chains: 15.6 mg/L (ref 5.7–26.3)

## 2022-01-17 ENCOUNTER — Telehealth: Payer: Self-pay | Admitting: Nurse Practitioner

## 2022-01-17 NOTE — Telephone Encounter (Signed)
Copied from [place] [id number]. Topic: Appointment Scheduling - Scheduling Inquiry for Clinic ?>> Jan 17, 2022  4:09 PM Pawlus, Brayton Layman A wrote: ?Reason for CRM: Pt wanted to change her AWV appt, pt stated she already has another appt on 6/16, please advise. ?

## 2022-01-18 LAB — MULTIPLE MYELOMA PANEL, SERUM
Albumin SerPl Elph-Mcnc: 3.7 g/dL (ref 2.9–4.4)
Albumin/Glob SerPl: 1.5 (ref 0.7–1.7)
Alpha 1: 0.3 g/dL (ref 0.0–0.4)
Alpha2 Glob SerPl Elph-Mcnc: 0.6 g/dL (ref 0.4–1.0)
B-Globulin SerPl Elph-Mcnc: 0.8 g/dL (ref 0.7–1.3)
Gamma Glob SerPl Elph-Mcnc: 0.9 g/dL (ref 0.4–1.8)
Globulin, Total: 2.6 g/dL (ref 2.2–3.9)
IgA: 112 mg/dL (ref 64–422)
IgG (Immunoglobin G), Serum: 1016 mg/dL (ref 586–1602)
IgM (Immunoglobulin M), Srm: 27 mg/dL (ref 26–217)
M Protein SerPl Elph-Mcnc: 0.2 g/dL — ABNORMAL HIGH
Total Protein ELP: 6.3 g/dL (ref 6.0–8.5)

## 2022-01-18 NOTE — Telephone Encounter (Signed)
Called patient back and spoke with daughter who states patient has a hard time hearing over the phone. She is going to discuss the AWV appointment with patient and call me back to reschedule. ?

## 2022-01-19 ENCOUNTER — Encounter: Payer: Self-pay | Admitting: Oncology

## 2022-01-19 ENCOUNTER — Inpatient Hospital Stay: Payer: PPO | Admitting: Oncology

## 2022-01-19 VITALS — BP 124/71 | HR 90 | Temp 97.9°F | Resp 18 | Wt 190.8 lb

## 2022-01-19 DIAGNOSIS — D539 Nutritional anemia, unspecified: Secondary | ICD-10-CM | POA: Diagnosis not present

## 2022-01-19 DIAGNOSIS — D649 Anemia, unspecified: Secondary | ICD-10-CM | POA: Diagnosis not present

## 2022-01-19 DIAGNOSIS — D472 Monoclonal gammopathy: Secondary | ICD-10-CM | POA: Diagnosis not present

## 2022-01-19 LAB — METHYLMALONIC ACID, SERUM: Methylmalonic Acid, Quantitative: 200 nmol/L (ref 0–378)

## 2022-01-19 NOTE — Progress Notes (Signed)
Pt here for follow up. No new concerns voiced.   

## 2022-01-19 NOTE — Progress Notes (Signed)
Hematology/Oncology Consult note [phone] [phone]) [phone]         Patient Care Team: Jon Billings, NP as PCP - General Conception Chancy, DDS as Referring Physician (Dentistry) Leandrew Koyanagi, MD as Referring Physician (Ophthalmology)  REFERRING PROVIDER: Jon Billings, NP  CHIEF COMPLAINTS/REASON FOR VISIT:  Anemia  HISTORY OF PRESENTING ILLNESS:   Mckenzie Gutierrez is a  88 y.o.  female with PMH listed below was seen in consultation at the request of  Jon Billings, NP  for evaluation of  anemia.   Patient is on Eliquis for A Fib.  + rectal bleeding  was seen by Dr.Anna and had flexible sigmoidoscopy on 05/14/20 and was found to have interval hemorrhoids. She had one of the hemorrhoid banded.  Her baseline hemoglobin was around 11. It was noted recently that her hemoglobin has decreased to 9-10s. MCV is 101 Patient was referred to establish care with hematology for further evaluation.  She is on oral ferrous sulfate once daily since last fall. + constipation, she takes laxatives.  Denies fever, chills, night sweats, abdominal pain + fatigue.  She has lost 5 pounds since Jan 2023.  + hard hearing.    INTERVAL HISTORY Mckenzie Gutierrez is a 88 y.o. female who has above history reviewed by me today presents for follow up visit for anemia,.  She was accompanied by her daughter,  No new complaints.  She presents to discuss results. Per daughter, rectal bleeding has improved since the banding of hemorrhoids.  Review of Systems  Constitutional:  Positive for fatigue. Negative for chills and fever.  HENT:   Positive for hearing loss. Negative for voice change.   Eyes:  Negative for eye problems.  Respiratory:  Negative for chest tightness and cough.   Cardiovascular:  Negative for chest pain.  Gastrointestinal:  Positive for blood in stool. Negative for abdominal distention and abdominal pain.  Endocrine: Negative for hot flashes.   Genitourinary:  Negative for difficulty urinating and frequency.   Musculoskeletal:  Negative for arthralgias.  Skin:  Negative for itching and rash.  Neurological:  Negative for extremity weakness.  Hematological:  Negative for adenopathy.  Psychiatric/Behavioral:  Negative for confusion.    MEDICAL HISTORY:  Past Medical History:  Diagnosis Date   Anxiety    Atrial premature beats    Bradycardia    Carotid stenosis    Depression    GERD (gastroesophageal reflux disease)    Hyperlipidemia    Hypertension    Low back pain    Osteoporosis    Thyroid disease    Urinary frequency     SURGICAL HISTORY: Past Surgical History:  Procedure Laterality Date   andarteretomy     CARPAL TUNNEL RELEASE     FLEXIBLE SIGMOIDOSCOPY N/A 05/13/2020   Procedure: FLEXIBLE SIGMOIDOSCOPY;  Surgeon: [name], MD;  Location: [facility] ENDOSCOPY;  Service: Gastroenterology;  Laterality: N/A;  Unsedated   WRIST SURGERY      SOCIAL HISTORY: Social History   Socioeconomic History   Marital status: Widowed    Spouse name: Not on file   Number of children: Not on file   Years of education: Not on file   Highest education level: Not on file  Occupational History   Not on file  Tobacco Use   Smoking status: Never   Smokeless tobacco: Never  Vaping Use   Vaping Use: Never used  Substance and Sexual Activity   Alcohol use: No    Alcohol/week: 0.0 standard drinks  Drug use: No   Sexual activity: Not Currently  Other Topics Concern   Not on file  Social History Narrative   Not on file   Social Determinants of Health   Financial Resource Strain: Low Risk    Difficulty of Paying Living Expenses: Not hard at all  Food Insecurity: No Food Insecurity   Worried About Running Out of Food in the Last Year: Never true   [place] in the Last Year: Never true  Transportation Needs: No Transportation Needs   Lack of Transportation (Medical): No   Lack of Transportation (Non-Medical): No   Physical Activity: Inactive   Days of Exercise per Week: 0 days   Minutes of Exercise per Session: 0 min  Stress: No Stress Concern Present   Feeling of Stress : Not at all  Social Connections: Not on file  Intimate Partner Violence: Not on file    FAMILY HISTORY: Family History  Problem Relation Age of Onset   Heart disease Mother    Stroke Father    Diabetes Father    Stroke Sister     ALLERGIES:  is allergic to celebrex [celecoxib], codeine sulfate, hydrochlorothiazide, hytrin [terazosin], plendil [felodipine], nitrofuran derivatives, and nitrofurantoin.  MEDICATIONS:  Current Outpatient Medications  Medication Sig Dispense Refill   acetaminophen (TYLENOL) 500 MG tablet Take 1,000 mg by mouth 2 (two) times daily.     albuterol (VENTOLIN HFA) 108 (90 Base) MCG/ACT inhaler INHALE 2 PUFFS INTO THE LUNGS EVERY SIX HOURS AS NEEDED FOR WHEEZING OR SHORTNESS OF BREATH 8.5 g 1   amLODipine (NORVASC) 5 MG tablet TAKE 1 TABLET BY MOUTH DAILY **NEED APPOINTMENT BEFORE ADDITIONAL REFILLS** 90 tablet 0   apixaban (ELIQUIS) 2.5 MG TABS tablet Take 2.5 mg by mouth 2 (two) times daily.     beta carotene w/minerals (OCUVITE) tablet Take 1 tablet by mouth daily.     ferrous sulfate 325 (65 FE) MG tablet Take 325 mg by mouth at bedtime.     furosemide (LASIX) 20 MG tablet TAKE ONE TABLET EVERY DAY 90 tablet 0   levothyroxine (SYNTHROID) 75 MCG tablet TAKE 1 TABLET EVERY DAY ON EMPTY STOMACHWITH A GLASS OF WATER AT LEAST 30-60 MINBEFORE BREAKFAST 90 tablet 1   LINZESS 145 MCG CAPS capsule TAKE ONE CAPSULE EACH MORNING BEFORE BREAKFAST 90 capsule 1   losartan (COZAAR) 100 MG tablet TAKE ONE TABLET EVERY DAY 90 tablet 1   Multiple Vitamin (MULTIVITAMIN) tablet Take 1 tablet by mouth daily.     pantoprazole (PROTONIX) 20 MG tablet TAKE 1 TABLET BY MOUTH DAILY 90 tablet 0   polyethylene glycol (MIRALAX / GLYCOLAX) 17 g packet Take 17 g by mouth daily.     sertraline (ZOLOFT) 25 MG tablet Take 1  tablet (25 mg total) by mouth daily. 90 tablet 1   erythromycin ophthalmic ointment SMARTSIG:sparingly In Eye(s) 3 Times Daily (Patient not taking: Reported on 01/13/2022)     hydrocortisone (ANUSOL-HC) 25 MG suppository Place 1 suppository (25 mg total) rectally 2 (two) times daily. (Patient not taking: Reported on 01/13/2022) 12 suppository 1   No current facility-administered medications for this visit.     PHYSICAL EXAMINATION: ECOG PERFORMANCE STATUS: 1 - Symptomatic but completely ambulatory Vitals:   01/19/22 1337  BP: 124/71  Pulse: 90  Resp: 18  Temp: 97.9 F (36.6 C)   Filed Weights   01/19/22 1337  Weight: 190 lb 12.8 oz (86.5 kg)    Physical Exam Constitutional:  General: She is not in acute distress.    Comments: Ambulates independently.   HENT:     Head: Normocephalic and atraumatic.  Eyes:     General: No scleral icterus. Cardiovascular:     Rate and Rhythm: Normal rate and regular rhythm.     Heart sounds: Murmur heard.  Pulmonary:     Effort: Pulmonary effort is normal. No respiratory distress.     Breath sounds: No wheezing.  Abdominal:     General: Bowel sounds are normal. There is no distension.     Palpations: Abdomen is soft.  Musculoskeletal:        General: No deformity. Normal range of motion.     Cervical back: Normal range of motion and neck supple.  Skin:    General: Skin is warm and dry.     Coloration: Skin is pale.     Findings: No erythema or rash.  Neurological:     Mental Status: She is alert. Mental status is at baseline.  Psychiatric:        Mood and Affect: Mood normal.    LABORATORY DATA:  I have reviewed the data as listed Lab Results  Component Value Date   WBC 7.2 01/13/2022   HGB 9.8 (L) 01/13/2022   HCT 30.7 (L) 01/13/2022   MCV 105.1 (H) 01/13/2022   PLT 222 01/13/2022   Recent Labs    03/23/21 1106 09/19/21 1201 12/20/21 1131 01/13/22 1325  NA 139 140 147*  --   K 4.2 4.0 3.8  --   CL 99 101 104  --    CO2 24 25 26   --   GLUCOSE 110* 101* 112*  --   BUN 15 15 11   --   CREATININE 0.88 0.87 0.84  --   CALCIUM 9.4 9.4 9.5  --   PROT 6.7 6.8 6.7 6.7  ALBUMIN 4.3 4.2 4.2 3.9  AST 23 23 23 29   ALT 11 11 14 14   ALKPHOS 76 80 74 60  BILITOT 0.3 0.4 0.3 0.6  BILIDIR  --   --   --  <0.1  IBILI  --   --   --  NOT CALCULATED    Iron/TIBC/Ferritin/ %Sat    Component Value Date/Time   IRON 55 01/13/2022 1325   IRON 32 03/23/2021 1106   TIBC 330 01/13/2022 1325   TIBC 352 03/23/2021 1106   FERRITIN 33 01/13/2022 1325   FERRITIN 17 03/23/2021 1106   IRONPCTSAT 17 01/13/2022 1325   IRONPCTSAT 9 (LL) 03/23/2021 1106       RADIOGRAPHIC STUDIES: I have personally reviewed the radiological images as listed and agreed with the findings in the report. No results found.    ASSESSMENT & PLAN:  1. Anemia, macrocytic   2. MGUS (monoclonal gammopathy of unknown significance)    Chronic macrocytic anemia,  Labs reviewed and discussed with patient and her daughter MMA is normal, normal bilirubin, normal LDH, normal haptoglobin, copper level, normal TSH, iron panel 17, ferritin level is 33, reticulocyte panel showed normal reticulocyte hemoglobin.  Hemoglobin 9.8, MCV 105. Anemia is likely not secondary to B12 or folate deficiency, iron deficiency.  Less likely due to hemolysis.  Most likely likely she has underlying bone marrow process, i.e. MDS.  Given patient's age and other comorbidities, she will decision was made to proceed with observation for now and hold off bone marrow biopsy.  #MGUS, discussed with the patient that this is likely a premyeloma condition.  M protein currently  is at 0.2.  Recommend observation.  Rectal bleeding, presumed from hemorrhoids s/p banding. Follow up with GI.  colonoscopy not done due to risk of anesthesia. Orders Placed This Encounter  Procedures   CBC with Differential/Platelet    Standing Status:   Future    Standing Expiration Date:   01/20/2023   Iron  and TIBC    Standing Status:   Future    Standing Expiration Date:   01/20/2023   Lactate dehydrogenase    Standing Status:   Future    Standing Expiration Date:   01/20/2023   Ferritin    Standing Status:   Future    Standing Expiration Date:   01/20/2023   Retic Panel    Standing Status:   Future    Standing Expiration Date:   01/20/2023   Technologist smear review    Standing Status:   Future    Standing Expiration Date:   01/20/2023    All questions were answered. The patient knows to call the clinic with any problems questions or concerns.  cc Jon Billings, NP    Return of visit: Follow-up 4 months. Thank you for this kind referral and the opportunity to participate in the care of this patient. A copy of today's note is routed to referring provider   Earlie Server, MD, PhD [facility] Hematology Oncology 01/19/2022

## 2022-01-24 DIAGNOSIS — H353231 Exudative age-related macular degeneration, bilateral, with active choroidal neovascularization: Secondary | ICD-10-CM | POA: Diagnosis not present

## 2022-01-24 DIAGNOSIS — H353221 Exudative age-related macular degeneration, left eye, with active choroidal neovascularization: Secondary | ICD-10-CM | POA: Diagnosis not present

## 2022-01-24 DIAGNOSIS — H353211 Exudative age-related macular degeneration, right eye, with active choroidal neovascularization: Secondary | ICD-10-CM | POA: Diagnosis not present

## 2022-02-16 ENCOUNTER — Other Ambulatory Visit: Payer: Self-pay | Admitting: Nurse Practitioner

## 2022-02-16 NOTE — Telephone Encounter (Signed)
Requested Prescriptions  Pending Prescriptions Disp Refills  . losartan (COZAAR) 100 MG tablet [Pharmacy Med Name: LOSARTAN POTASSIUM 100 MG TAB] 90 tablet 0    Sig: TAKE ONE TABLET EVERY DAY     Cardiovascular:  Angiotensin Receptor Blockers Passed - 02/16/2022  2:31 PM      Passed - Cr in normal range and within 180 days    Creatinine, Ser  Date Value Ref Range Status  12/20/2021 0.84 0.57 - 1.00 mg/dL Final         Passed - K in normal range and within 180 days    Potassium  Date Value Ref Range Status  12/20/2021 3.8 3.5 - 5.2 mmol/L Final         Passed - Patient is not pregnant      Passed - Last BP in normal range    BP Readings from Last 1 Encounters:  01/19/22 124/71         Passed - Valid encounter within last 6 months    Recent Outpatient Visits          1 month ago Primary hypertension   [facility] Jon Billings, NP   5 months ago Exudative age-related macular degeneration of left eye with active choroidal neovascularization ([place])   [facility] Jon Billings, NP   6 months ago Viral upper respiratory tract infection   [facility] Jon Billings, NP   9 months ago Anemia, unspecified type   [facility] Jon Billings, NP   11 months ago Primary hypertension   [facility] Jon Billings, NP      Future Appointments            In 5 days [name], MD [place]   In 1 month Jon Billings, NP [facility]           . levothyroxine (SYNTHROID) 75 MCG tablet [Pharmacy Med Name: LEVOTHYROXINE SODIUM 75 MCG TAB] 90 tablet 1    Sig: TAKE 1 TABLET EVERY DAY ON EMPTY [place] A GLASS OF WATER AT LEAST 30-60 [place]     Endocrinology:  Hypothyroid Agents Passed - 02/16/2022  2:31 PM      Passed - TSH in normal range and within 360 days    TSH  Date Value Ref Range Status  01/13/2022 1.961 0.350 - 4.500 uIU/mL Final    Comment:     Performed by a 3rd Generation assay with a functional sensitivity of <=0.01 uIU/mL. Performed at [facility], [address] [place], [place] [address]   12/20/2021 3.150 0.450 - 4.500 uIU/mL Final         Passed - Valid encounter within last 12 months    Recent Outpatient Visits          1 month ago Primary hypertension   [facility] Jon Billings, NP   5 months ago Exudative age-related macular degeneration of left eye with active choroidal neovascularization ([place])   [facility] Jon Billings, NP   6 months ago Viral upper respiratory tract infection   [facility] Jon Billings, NP   9 months ago Anemia, unspecified type   [facility] Jon Billings, NP   11 months ago Primary hypertension   [facility] Jon Billings, NP      Future Appointments            In 5 days [name], MD [place]   In 1  month Jon Billings, NP [facility]

## 2022-02-17 ENCOUNTER — Ambulatory Visit: Payer: PPO

## 2022-02-21 ENCOUNTER — Encounter: Payer: Self-pay | Admitting: Gastroenterology

## 2022-02-21 ENCOUNTER — Other Ambulatory Visit: Payer: Self-pay

## 2022-02-21 ENCOUNTER — Ambulatory Visit (INDEPENDENT_AMBULATORY_CARE_PROVIDER_SITE_OTHER): Payer: PPO | Admitting: Gastroenterology

## 2022-02-21 VITALS — BP 119/68 | HR 77 | Temp 98.1°F | Wt 190.8 lb

## 2022-02-21 DIAGNOSIS — K648 Other hemorrhoids: Secondary | ICD-10-CM | POA: Diagnosis not present

## 2022-02-21 NOTE — Progress Notes (Signed)
Patient follow-ups today for banding of hemorrhoids    Summary of history : She states that she has been having persistent rectal bleeding.  Usually after bowel movement bright red blood on the tissue paper and in the toilet bowl.  No pain during the process.  No NSAID use.  No nosebleeds.  No hematemesis. 05/14/2020 flexible sigmoidoscopy: Grade 2 internal hemorrhoids were noted.  No other bleeding from any other site was noted.     First round:01/11/2022: LL banded   Interval history   01/11/2022-02/21/2022  Did very well for a few weeks, then had episode of diarrhea following which had perianal discomnfort and bleeding   Digital rectal exam performed in the presence of a chaperone. External anal findings: excoriation and erythema  Internal findings: , No masses, no blood on glove noticed.    PROCEDURE NOTE: The patient presents with symptomatic grade 2 hemorrhoids, unresponsive to maximal medical therapy, requesting rubber band ligation of his/her hemorrhoidal disease.  All risks, benefits and alternative forms of therapy were described and informed consent was obtained.  In the Left Lateral Decubitus position (if anoscopy is performed) anoscopic examination revealed grade 2 hemorrhoids in the RA and RP position(s).   The decision was made to band the RA internal hemorrhoid, and the [place] was used to perform band ligation without complication.  Digital anorectal examination was then performed to assure proper positioning of the band, and to adjust the banded tissue as required.  The patient was discharged home without pain or other issues.  Dietary and behavioral recommendations were given and (if necessary - prescriptions were given), along with follow-up instructions.  The patient will return 4 weeks for follow-up and possible additional banding as required.  No complications were encountered and the patient tolerated the procedure well.   Plan:  Avoid constipation.   Commence on stool softeners if not already on 2. [place] topical in peri anal area   Follow-up:4 weeks  Dr [name] MD,MRCP [facility]) Gastroenterology/Hepatology Pager: [phone]

## 2022-03-09 ENCOUNTER — Other Ambulatory Visit: Payer: Self-pay | Admitting: Nurse Practitioner

## 2022-03-10 NOTE — Telephone Encounter (Signed)
Requested Prescriptions  Pending Prescriptions Disp Refills  . pantoprazole (PROTONIX) 20 MG tablet [Pharmacy Med Name: PANTOPRAZOLE SODIUM 20 MG DR TAB] 90 tablet 0    Sig: TAKE 1 TABLET BY MOUTH DAILY     Gastroenterology: Proton Pump Inhibitors Passed - 03/09/2022  2:30 PM      Passed - Valid encounter within last 12 months    Recent Outpatient Visits          2 months ago Primary hypertension   [facility] Jon Billings, NP   5 months ago Exudative age-related macular degeneration of left eye with active choroidal neovascularization ([place])   [facility] Jon Billings, NP   7 months ago Viral upper respiratory tract infection   [facility] Jon Billings, NP   10 months ago Anemia, unspecified type   [facility] Jon Billings, NP   11 months ago Primary hypertension   [facility] Jon Billings, NP      Future Appointments            In 2 weeks [name], MD [place]   In 2 weeks Jon Billings, NP [facility], [place]

## 2022-03-14 ENCOUNTER — Ambulatory Visit (INDEPENDENT_AMBULATORY_CARE_PROVIDER_SITE_OTHER): Payer: PPO

## 2022-03-14 ENCOUNTER — Encounter (INDEPENDENT_AMBULATORY_CARE_PROVIDER_SITE_OTHER): Payer: Self-pay | Admitting: Vascular Surgery

## 2022-03-14 ENCOUNTER — Ambulatory Visit (INDEPENDENT_AMBULATORY_CARE_PROVIDER_SITE_OTHER): Payer: PPO | Admitting: Vascular Surgery

## 2022-03-14 VITALS — BP 130/58 | HR 72 | Resp 16 | Wt 188.2 lb

## 2022-03-14 DIAGNOSIS — I6523 Occlusion and stenosis of bilateral carotid arteries: Secondary | ICD-10-CM

## 2022-03-14 DIAGNOSIS — I10 Essential (primary) hypertension: Secondary | ICD-10-CM

## 2022-03-14 DIAGNOSIS — I4891 Unspecified atrial fibrillation: Secondary | ICD-10-CM | POA: Diagnosis not present

## 2022-03-14 DIAGNOSIS — E785 Hyperlipidemia, unspecified: Secondary | ICD-10-CM | POA: Diagnosis not present

## 2022-03-14 NOTE — Progress Notes (Signed)
MRN : [id number]  Mckenzie Gutierrez is a 88 y.o. (1922/06/26) female who presents with chief complaint of  Chief Complaint  Patient presents with   Follow-up    Ultrasound follow up  .  History of Present Illness: Patient returns today in follow up of her carotid disease.  She is over a decade status post right carotid endarterectomy.  She is doing reasonably well despite her advancing age.  She has no focal neurologic symptoms or new complaints today.  Carotid duplex shows a widely patent right carotid endarterectomy and 1 to 39% left ICA stenosis.  Current Outpatient Medications  Medication Sig Dispense Refill   acetaminophen (TYLENOL) 500 MG tablet Take 1,000 mg by mouth 2 (two) times daily.     albuterol (VENTOLIN HFA) 108 (90 Base) MCG/ACT inhaler INHALE 2 PUFFS INTO THE LUNGS EVERY SIX HOURS AS NEEDED FOR WHEEZING OR SHORTNESS OF BREATH 8.5 g 1   amLODipine (NORVASC) 5 MG tablet TAKE 1 TABLET BY MOUTH DAILY **NEED APPOINTMENT BEFORE ADDITIONAL REFILLS** 90 tablet 0   apixaban (ELIQUIS) 2.5 MG TABS tablet Take 2.5 mg by mouth 2 (two) times daily.     beta carotene w/minerals (OCUVITE) tablet Take 1 tablet by mouth daily.     erythromycin ophthalmic ointment      ferrous sulfate 325 (65 FE) MG tablet Take 325 mg by mouth at bedtime.     furosemide (LASIX) 40 MG tablet Take 1 tablet by mouth daily.     hydrocortisone (ANUSOL-HC) 25 MG suppository Place 1 suppository (25 mg total) rectally 2 (two) times daily. 12 suppository 1   levothyroxine (SYNTHROID) 75 MCG tablet TAKE 1 TABLET EVERY DAY ON EMPTY STOMACHWITH A GLASS OF WATER AT LEAST 30-60 MINBEFORE BREAKFAST 90 tablet 1   LINZESS 145 MCG CAPS capsule TAKE ONE CAPSULE EACH MORNING BEFORE BREAKFAST 90 capsule 1   losartan (COZAAR) 100 MG tablet TAKE ONE TABLET EVERY DAY 90 tablet 0   Multiple Vitamin (MULTIVITAMIN) tablet Take 1 tablet by mouth daily.     pantoprazole (PROTONIX) 20 MG tablet TAKE 1 TABLET BY MOUTH DAILY 90 tablet 0    polyethylene glycol (MIRALAX / GLYCOLAX) 17 g packet Take 17 g by mouth daily.     sertraline (ZOLOFT) 25 MG tablet Take 1 tablet (25 mg total) by mouth daily. 90 tablet 1   No current facility-administered medications for this visit.    Past Medical History:  Diagnosis Date   Anxiety    Atrial premature beats    Bradycardia    Carotid stenosis    Depression    GERD (gastroesophageal reflux disease)    Hyperlipidemia    Hypertension    Low back pain    Osteoporosis    Thyroid disease    Urinary frequency     Past Surgical History:  Procedure Laterality Date   andarteretomy     CARPAL TUNNEL RELEASE     FLEXIBLE SIGMOIDOSCOPY N/A 05/13/2020   Procedure: FLEXIBLE SIGMOIDOSCOPY;  Surgeon: [name], MD;  Location[facility] ENDOSCOPY;  Service: Gastroenterology;  Laterality: N/A;  Unsedated   WRIST SURGERY       Social History   Tobacco Use   Smoking status: Never   Smokeless tobacco: Never  Vaping Use   Vaping Use: Never used  Substance Use Topics   Alcohol use: No    Alcohol/week: 0.0 standard drinks of alcohol   Drug use: No      Family History  Problem Relation Age of  Onset   Heart disease Mother    Stroke Father    Diabetes Father    Stroke Sister      Allergies  Allergen Reactions   Celebrex [Celecoxib] Other (See Comments)    Hard stools   Codeine Sulfate Other (See Comments)   Hydrochlorothiazide    Hytrin [Terazosin]    Plendil [Felodipine] Diarrhea and Nausea Only   Nitrofuran Derivatives Swelling and Rash   Nitrofurantoin Rash and Swelling     REVIEW OF SYSTEMS (Negative unless checked)  Constitutional: []Weight loss  []Fever  []Chills Cardiac: []Chest pain   []Chest pressure   []Palpitations   []Shortness of breath when laying flat   []Shortness of breath at rest   []Shortness of breath with exertion. Vascular:  []Pain in legs with walking   []Pain in legs at rest   []Pain in legs when laying flat   []Claudication   []Pain in feet when  walking  []Pain in feet at rest  []Pain in feet when laying flat   []History of DVT   []Phlebitis   []Swelling in legs   []Varicose veins   []Non-healing ulcers Pulmonary:   []Uses home oxygen   []Productive cough   []Hemoptysis   []Wheeze  []COPD   []Asthma Neurologic:  []Dizziness  []Blackouts   []Seizures   []History of stroke   []History of TIA  []Aphasia   []Temporary blindness   []Dysphagia   []Weakness or numbness in arms   []Weakness or numbness in legs Musculoskeletal:  []Arthritis   []Joint swelling   []Joint pain   []Low back pain Hematologic:  []Easy bruising  []Easy bleeding   []Hypercoagulable state   []Anemic   Gastrointestinal:  []Blood in stool   []Vomiting blood  []Gastroesophageal reflux/heartburn   []Abdominal pain Genitourinary:  []Chronic kidney disease   []Difficult urination  []Frequent urination  []Burning with urination   []Hematuria Skin:  []Rashes   []Ulcers   []Wounds Psychological:  []History of anxiety   [] History of major depression.  Physical Examination  BP (!) 130/58 (BP Location: Left Arm)   Pulse 72   Resp 16   Wt 188 lb 3.2 oz (85.4 kg)   LMP  (LMP Unknown)   BMI 29.48 kg/m  Gen:  WD/WN, NAD. Appears younger than stated age Head: [place]/AT, No temporalis wasting. Ear/Nose/Throat: Hearing diminished, nares w/o erythema or drainage Eyes: Conjunctiva clear. Sclera non-icteric Neck: Supple.  Trachea midline Pulmonary:  Good air movement, no use of accessory muscles.  Cardiac: RRR, no JVD Vascular:  Vessel Right Left  Radial Palpable Palpable           Musculoskeletal: M/S 5/5 throughout.  No deformity or atrophy. No edema. Neurologic: Sensation grossly intact in extremities.  Symmetrical.  Speech is fluent.  Psychiatric: Judgment intact, Mood & affect appropriate for pt's clinical situation. Dermatologic: No rashes or ulcers noted.  No cellulitis or open wounds.      Labs Recent Results (from the past 2160 hour(s))  Comp Met (CMET)     Status:  Abnormal   Collection Time: 12/20/21 11:31 AM  Result Value Ref Range   Glucose 112 (H) 70 - 99 mg/dL   BUN 11 10 - 36 mg/dL   Creatinine, Ser 0.84 0.57 - 1.00 mg/dL   eGFR 63 >59 mL/min/1.73   BUN/Creatinine Ratio 13 12 - 28   Sodium 147 (H) 134 - 144 mmol/L   Potassium 3.8 3.5 - 5.2 mmol/L   Chloride 104 96 - 106 mmol/L  CO2 26 20 - 29 mmol/L   Calcium 9.5 8.7 - 10.3 mg/dL   Total Protein 6.7 6.0 - 8.5 g/dL   Albumin 4.2 3.5 - 4.6 g/dL   Globulin, Total 2.5 1.5 - 4.5 g/dL   Albumin/Globulin Ratio 1.7 1.2 - 2.2   Bilirubin Total 0.3 0.0 - 1.2 mg/dL   Alkaline Phosphatase 74 44 - 121 IU/L   AST 23 0 - 40 IU/L   ALT 14 0 - 32 IU/L  CBC w/Diff     Status: Abnormal   Collection Time: 12/20/21 11:31 AM  Result Value Ref Range   WBC 6.0 3.4 - 10.8 x10E3/uL   RBC 3.17 (L) 3.77 - 5.28 x10E6/uL   Hemoglobin 10.7 (L) 11.1 - 15.9 g/dL   Hematocrit 31.1 (L) 34.0 - 46.6 %   MCV 98 (H) 79 - 97 fL   MCH 33.8 (H) 26.6 - 33.0 pg   MCHC 34.4 31.5 - 35.7 g/dL   RDW 15.2 11.7 - 15.4 %   Platelets 216 150 - 450 x10E3/uL   Neutrophils 55 Not Estab. %   Lymphs 31 Not Estab. %   Monocytes 10 Not Estab. %   Eos 1 Not Estab. %   Basos 2 Not Estab. %   Neutrophils Absolute 3.4 1.4 - 7.0 x10E3/uL   Lymphocytes Absolute 1.9 0.7 - 3.1 x10E3/uL   Monocytes Absolute 0.6 0.1 - 0.9 x10E3/uL   EOS (ABSOLUTE) 0.1 0.0 - 0.4 x10E3/uL   Basophils Absolute 0.1 0.0 - 0.2 x10E3/uL   Immature Granulocytes 1 Not Estab. %   Immature Grans (Abs) 0.0 0.0 - 0.1 x10E3/uL   NRBC 1 (H) 0 - 0 %  TSH     Status: None   Collection Time: 12/20/21 11:31 AM  Result Value Ref Range   TSH 3.150 0.450 - 4.500 uIU/mL  T4, free     Status: None   Collection Time: 12/20/21 11:31 AM  Result Value Ref Range   Free T4 1.19 0.82 - 1.77 ng/dL  CBC w/Diff     Status: Abnormal   Collection Time: 01/05/22 10:44 AM  Result Value Ref Range   WBC 6.5 3.4 - 10.8 x10E3/uL   RBC 2.73 (LL) 3.77 - 5.28 x10E6/uL   Hemoglobin 9.2 (L)  11.1 - 15.9 g/dL   Hematocrit 27.5 (L) 34.0 - 46.6 %   MCV 101 (H) 79 - 97 fL   MCH 33.7 (H) 26.6 - 33.0 pg   MCHC 33.5 31.5 - 35.7 g/dL   RDW 15.7 (H) 11.7 - 15.4 %   Platelets 221 150 - 450 x10E3/uL   Neutrophils 54 Not Estab. %   Lymphs 34 Not Estab. %   Monocytes 9 Not Estab. %   Eos 1 Not Estab. %   Basos 1 Not Estab. %   Neutrophils Absolute 3.6 1.4 - 7.0 x10E3/uL   Lymphocytes Absolute 2.2 0.7 - 3.1 x10E3/uL   Monocytes Absolute 0.6 0.1 - 0.9 x10E3/uL   EOS (ABSOLUTE) 0.1 0.0 - 0.4 x10E3/uL   Basophils Absolute 0.1 0.0 - 0.2 x10E3/uL   Immature Granulocytes 1 Not Estab. %   Immature Grans (Abs) 0.0 0.0 - 0.1 x10E3/uL   NRBC 1 (H) 0 - 0 %  B12     Status: None   Collection Time: 01/09/22 10:27 AM  Result Value Ref Range   Vitamin B-12 910 232 - 1,245 pg/mL  Hemoglobin     Status: Abnormal   Collection Time: 01/11/22  2:25 PM  Result Value  Ref Range   Hemoglobin 9.9 (L) 11.1 - 15.9 g/dL  Folate     Status: None   Collection Time: 01/11/22  2:27 PM  Result Value Ref Range   Folate >20.0 >3.0 ng/mL    Comment: A serum folate concentration of less than 3.1 ng/mL is considered to represent clinical deficiency.   Reticulocytes     Status: Abnormal   Collection Time: 01/11/22  2:31 PM  Result Value Ref Range   Retic Ct Pct 3.7 (H) 0.6 - 2.6 %  Methylmalonic acid, serum     Status: None   Collection Time: 01/13/22  1:25 PM  Result Value Ref Range   Methylmalonic Acid, Quantitative 200 0 - 378 nmol/L    Comment: (NOTE) This test was developed and its performance characteristics determined by [facility]. It has not been cleared or approved by the Food and Drug Administration. Performed At: [facility] [place], [place] [id number] Rush Farmer MD [id number]   Hepatic function panel     Status: None   Collection Time: 01/13/22  1:25 PM  Result Value Ref Range   Total Protein 6.7 6.5 - 8.1 g/dL   Albumin 3.9 3.5 - 5.0 g/dL   AST 29 15 - 41 U/L    ALT 14 0 - 44 U/L   Alkaline Phosphatase 60 38 - 126 U/L   Total Bilirubin 0.6 0.3 - 1.2 mg/dL   Bilirubin, Direct <0.1 0.0 - 0.2 mg/dL   Indirect Bilirubin NOT CALCULATED 0.3 - 0.9 mg/dL    Comment: Performed at [facility], [address] [place], [place] [address]  Lactate dehydrogenase     Status: None   Collection Time: 01/13/22  1:25 PM  Result Value Ref Range   LDH 127 98 - 192 U/L    Comment: Performed at [facility], [address] [place], [place] [address]  Haptoglobin     Status: None   Collection Time: 01/13/22  1:25 PM  Result Value Ref Range   Haptoglobin 126 41 - 333 mg/dL    Comment: (NOTE) Performed At: [facility] [address] [place], [place] [id number] Rush Farmer MD [id number]   Copper, serum     Status: None   Collection Time: 01/13/22  1:25 PM  Result Value Ref Range   Copper 99 80 - 158 ug/dL    Comment: (NOTE) This test was developed and its performance characteristics determined by [facility]. It has not been cleared or approved by the Food and Drug Administration.                                Detection Limit = 5 Performed At: [facility] [place], [place] [id number] Rush Farmer MD [id number]   Kappa/lambda light chains     Status: Abnormal   Collection Time: 01/13/22  1:25 PM  Result Value Ref Range   Kappa free light chain 32.2 (H) 3.3 - 19.4 mg/L   Lambda free light chains 15.6 5.7 - 26.3 mg/L   Kappa, lambda light chain ratio 2.06 (H) 0.26 - 1.65    Comment: (NOTE) Performed At: [facility] [address] [place], [place] [id number] Rush Farmer MD [id number]   Multiple Myeloma Panel (SPEP&IFE w/QIG)     Status: Abnormal   Collection Time: 01/13/22  1:25 PM  Result Value Ref Range   IgG (Immunoglobin G), Serum 1,016 586 - 1,602 mg/dL  IgA 112 64 - 422 mg/dL   IgM (Immunoglobulin M), Srm 27 26 - 217 mg/dL   Total Protein ELP 6.3 6.0 - 8.5 g/dL   Albumin  SerPl Elph-Mcnc 3.7 2.9 - 4.4 g/dL   Alpha 1 0.3 0.0 - 0.4 g/dL   Alpha2 Glob SerPl Elph-Mcnc 0.6 0.4 - 1.0 g/dL   B-Globulin SerPl Elph-Mcnc 0.8 0.7 - 1.3 g/dL   Gamma Glob SerPl Elph-Mcnc 0.9 0.4 - 1.8 g/dL   M Protein SerPl Elph-Mcnc 0.2 (H) Not Observed g/dL   Globulin, Total 2.6 2.2 - 3.9 g/dL   Albumin/Glob SerPl 1.5 0.7 - 1.7   IFE 1 Comment (A)     Comment: (NOTE) Immunofixation shows IgG monoclonal protein with kappa light chain specificity. PLEASE NOTE: Samples from patients receiving DARZALEX(R) (daratumumab) or SARCLISA(R)(isatuximab-irfc) treatment can appear as an "IgG kappa" and mask a complete response (CR). If this patient is receiving these therapies, this IFE assay interference can be removed by ordering test number 123218-"Immunofixation, Daratumumab-Specific, Serum" or [id number]-"Immunofixation, Isatuximab-Specific, Serum" and submitting a new sample for testing or by calling the lab to add this test to the current sample.    Please Note Comment     Comment: (NOTE) Protein electrophoresis scan will follow via computer, mail, or courier delivery. Performed At: [facility] [place], [place] [id number] Rush Farmer MD [id number]   TSH     Status: None   Collection Time: 01/13/22  1:25 PM  Result Value Ref Range   TSH 1.961 0.350 - 4.500 uIU/mL    Comment: Performed by a 3rd Generation assay with a functional sensitivity of <=0.01 uIU/mL. Performed at [facility], [address] [place], [place] [address]   Iron and TIBC     Status: None   Collection Time: 01/13/22  1:25 PM  Result Value Ref Range   Iron 55 28 - 170 ug/dL   TIBC 330 250 - 450 ug/dL   Saturation Ratios 17 10.4 - 31.8 %   UIBC 275 ug/dL    Comment: Performed at [facility], [address] [place], [place] [address]  Ferritin     Status: None   Collection Time: 01/13/22  1:25 PM  Result Value Ref Range   Ferritin 33 11 - 307 ng/mL     Comment: Performed at [facility], [address] [place], [place] [address]  Retic Panel     Status: Abnormal   Collection Time: 01/13/22  1:25 PM  Result Value Ref Range   Retic Ct Pct 3.7 (H) 0.4 - 3.1 %   RBC. 2.91 (L) 3.87 - 5.11 MIL/uL   Retic Count, Absolute 106.2 19.0 - 186.0 K/uL   Immature Retic Fract 25.5 (H) 2.3 - 15.9 %   Reticulocyte Hemoglobin 33.4 >27.9 pg    Comment:        Given the high negative predictive value of a RET-He result > 32 pg iron deficiency is essentially excluded. If this patient is anemic other etiologies should be considered. Performed at [facility], [address] [place],  [address]   CBC with Differential/Platelet     Status: Abnormal   Collection Time: 01/13/22  1:25 PM  Result Value Ref Range   WBC 7.2 4.0 - 10.5 K/uL   RBC 2.92 (L) 3.87 - 5.11 MIL/uL   Hemoglobin 9.8 (L) 12.0 - 15.0 g/dL   HCT 30.7 (L) 36.0 - 46.0 %   MCV 105.1 (H) 80.0 - 100.0 fL  MCH 33.6 26.0 - 34.0 pg   MCHC 31.9 30.0 - 36.0 g/dL   RDW 17.7 (H) 11.5 - 15.5 %   Platelets 222 150 - 400 K/uL   nRBC 1.1 (H) 0.0 - 0.2 %   Neutrophils Relative % 64 %   Neutro Abs 4.6 1.7 - 7.7 K/uL   Lymphocytes Relative 26 %   Lymphs Abs 1.9 0.7 - 4.0 K/uL   Monocytes Relative 8 %   Monocytes Absolute 0.6 0.1 - 1.0 K/uL   Eosinophils Relative 1 %   Eosinophils Absolute 0.1 0.0 - 0.5 K/uL   Basophils Relative 1 %   Basophils Absolute 0.1 0.0 - 0.1 K/uL   Immature Granulocytes 0 %   Abs Immature Granulocytes 0.02 0.00 - 0.07 K/uL    Comment: Performed at [facility], [address], [place], [place] [address]  Technologist smear review     Status: None   Collection Time: 01/13/22  1:27 PM  Result Value Ref Range   WBC MORPHOLOGY Normal RBC, WBC, and platelet    RBC MORPHOLOGY Normal RBC, WBC, and platelet    Tech Review Normal RBC, WBC, and platelet     Comment: Performed at [facility], [address] [place], [place] [address]     Radiology No results found.  Assessment/Plan Hypertension blood pressure control important in reducing the progression of atherosclerotic disease. On appropriate oral medications.     Atrial fibrillation (HCC) On 2.5 mg of Eliquis bid.     HLD (hyperlipidemia) lipid control important in reducing the progression of atherosclerotic disease.     Carotid stenosis Carotid duplex shows a widely patent right carotid endarterectomy and 1 to 39% left ICA stenosis.  Doing well.  Already on Eliquis for cardiac issues which is certainly adequate therapy for her mild carotid disease.  No changes in her medications.  Recheck in 1 year.    Leotis Pain, MD  03/14/2022 2:33 PM    This note was created with Dragon medical transcription system.  Any errors from dictation are purely unintentional

## 2022-03-14 NOTE — Assessment & Plan Note (Signed)
Carotid duplex shows a widely patent right carotid endarterectomy and 1 to 39% left ICA stenosis.  Doing well.  Already on Eliquis for cardiac issues which is certainly adequate therapy for her mild carotid disease.  No changes in her medications.  Recheck in 1 year.

## 2022-03-21 ENCOUNTER — Ambulatory Visit: Payer: PPO | Admitting: Nurse Practitioner

## 2022-03-21 ENCOUNTER — Other Ambulatory Visit: Payer: Self-pay | Admitting: Nurse Practitioner

## 2022-03-22 NOTE — Telephone Encounter (Signed)
Rx 12/20/21 #90 1RF- too soon Requested Prescriptions  Pending Prescriptions Disp Refills  . sertraline (ZOLOFT) 25 MG tablet [Pharmacy Med Name: SERTRALINE HCL 25 MG TAB] 90 tablet 1    Sig: TAKE 1 TABLET BY MOUTH DAILY     Psychiatry:  Antidepressants - SSRI - sertraline Passed - 03/21/2022 11:18 AM      Passed - AST in normal range and within 360 days    AST  Date Value Ref Range Status  01/13/2022 29 15 - 41 U/L Final         Passed - ALT in normal range and within 360 days    ALT  Date Value Ref Range Status  01/13/2022 14 0 - 44 U/L Final         Passed - Completed PHQ-2 or PHQ-9 in the last 360 days      Passed - Valid encounter within last 6 months    Recent Outpatient Visits          3 months ago Primary hypertension   [facility] Jon Billings, NP   6 months ago Exudative age-related macular degeneration of left eye with active choroidal neovascularization ([place])   [facility] Jon Billings, NP   8 months ago Viral upper respiratory tract infection   [facility] Jon Billings, NP   10 months ago Anemia, unspecified type   [facility] Jon Billings, NP   1 year ago Primary hypertension   [facility] Jon Billings, NP      Future Appointments            In 5 days [name], MD [place]   In 6 days Jon Billings, NP [facility], [place]

## 2022-03-26 IMAGING — DX DG HAND COMPLETE 3+V*R*
4 series · 4 of 4 positions shown · non-contrast
Comparison: None.

CLINICAL DATA: Fell, right hand pain and bruising, swelling of
index finger

EXAM:
RIGHT HAND - COMPLETE 3+ VIEW

[hand ap]
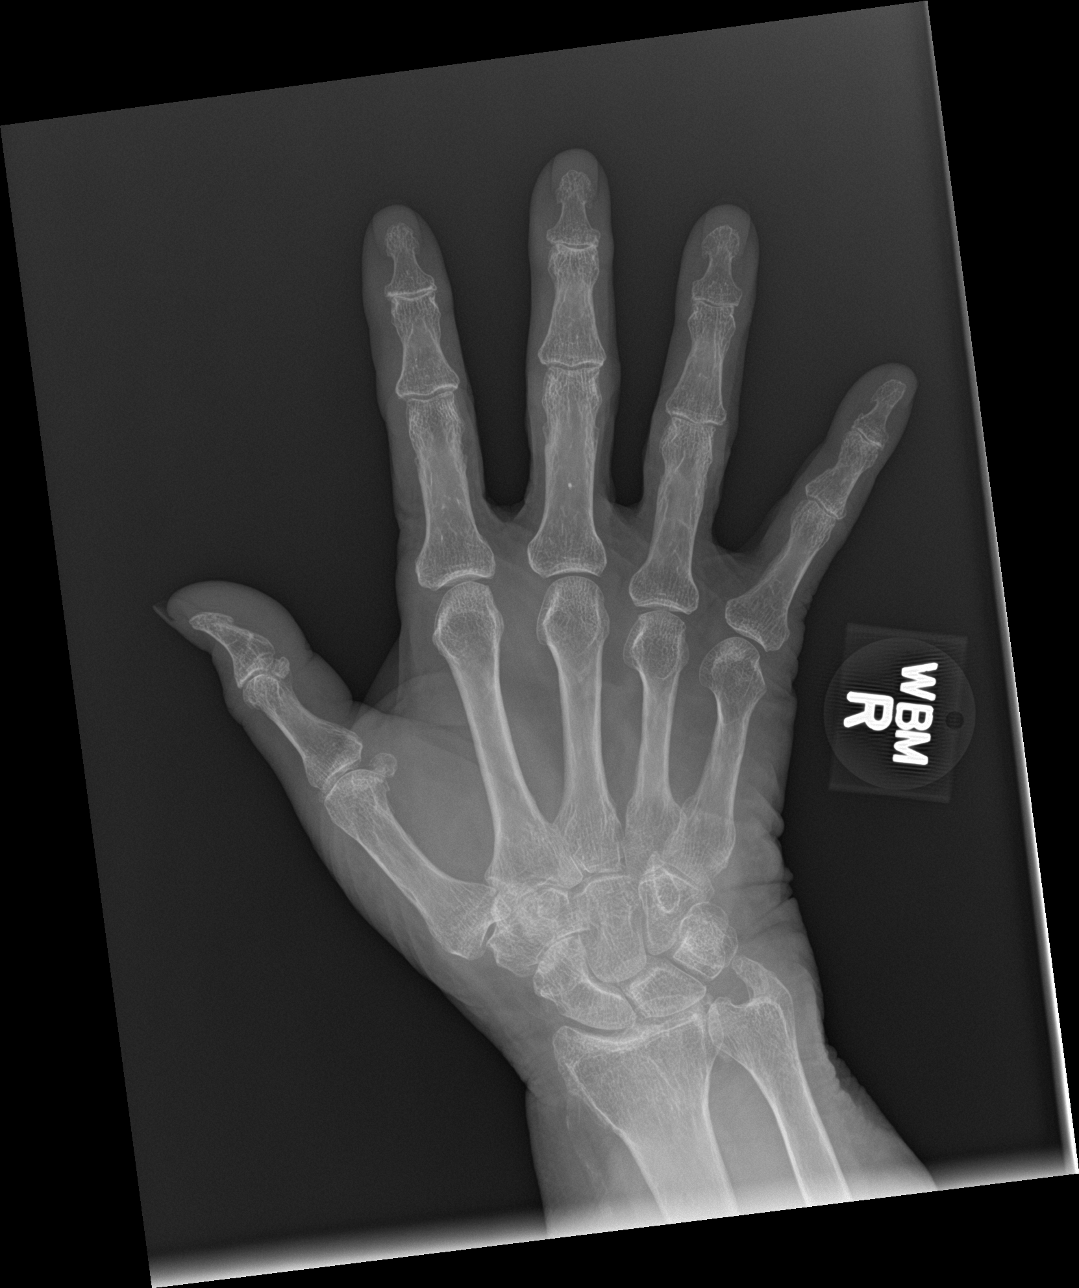

[hand obl]
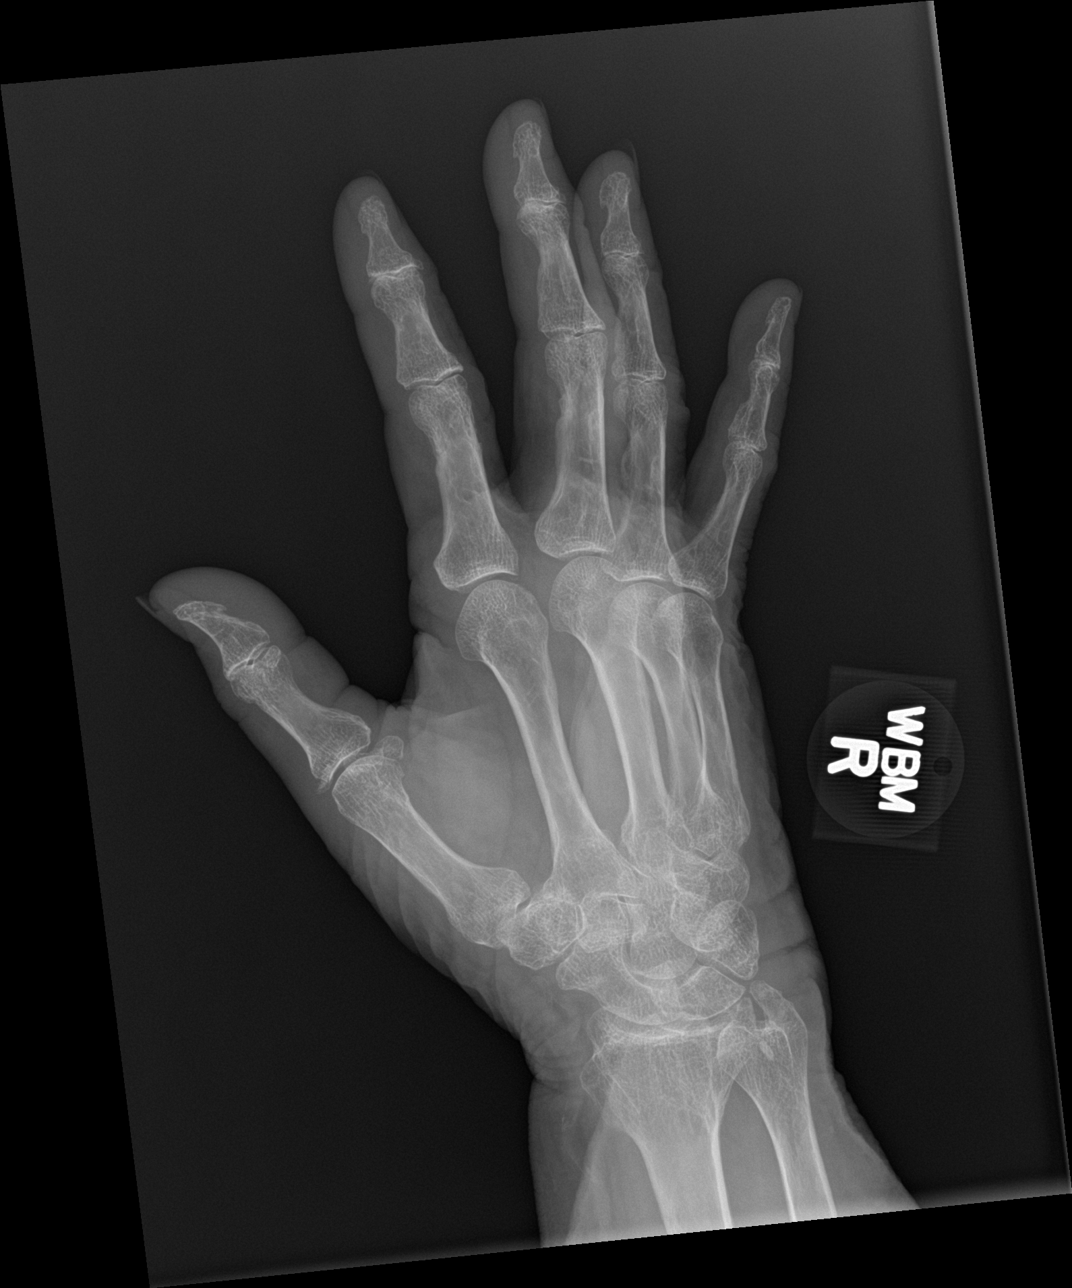

[hand lat (1 of 2)]
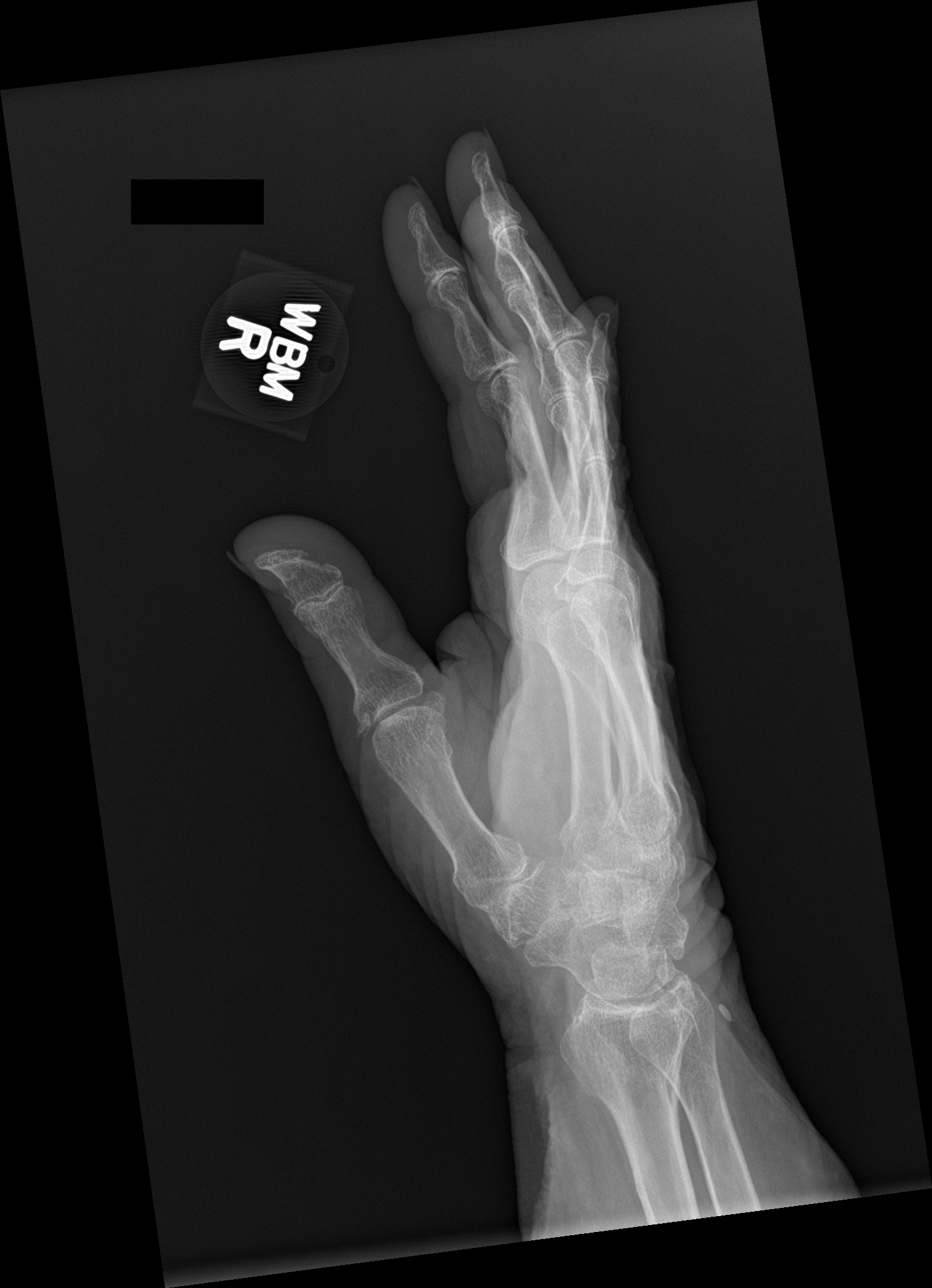

[hand lat (2 of 2)]
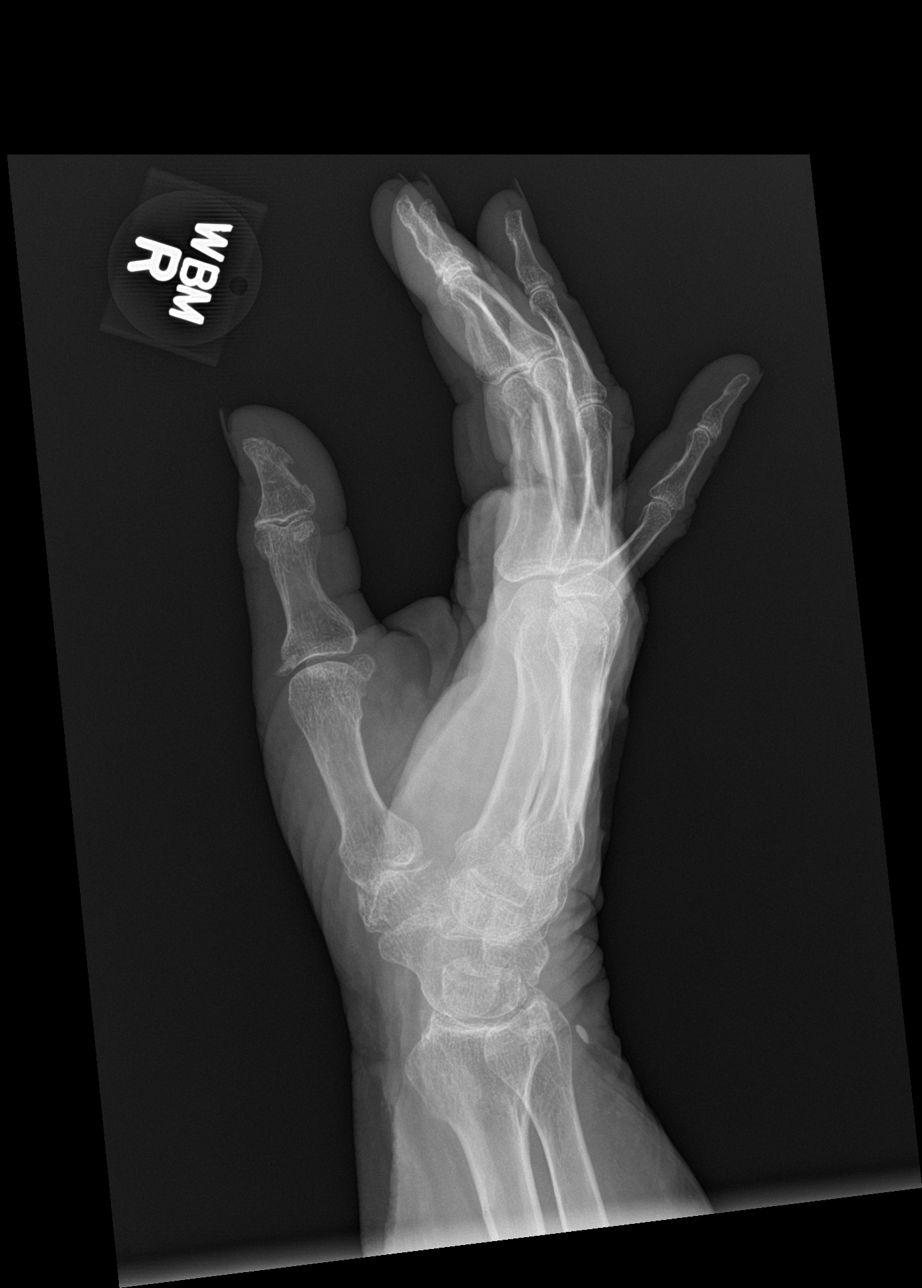

[4 of 4 positions shown; findings below may reference images not displayed]

FINDINGS: Frontal, oblique, lateral views of the right hand are obtained. No
acute fracture, subluxation, or dislocation. There is diffuse
interphalangeal joint space narrowing consistent with
osteoarthritis. Mild joint space narrowing also noted within within
the radial aspect of the carpus and radiocarpal joint. Soft tissues
are unremarkable.
IMPRESSION: 1. Multifocal osteoarthritis.
2. No acute displaced fracture.

## 2022-03-27 ENCOUNTER — Ambulatory Visit (INDEPENDENT_AMBULATORY_CARE_PROVIDER_SITE_OTHER): Payer: PPO | Admitting: Gastroenterology

## 2022-03-27 ENCOUNTER — Encounter: Payer: Self-pay | Admitting: Gastroenterology

## 2022-03-27 ENCOUNTER — Other Ambulatory Visit: Payer: Self-pay | Admitting: Nurse Practitioner

## 2022-03-27 VITALS — BP 146/61 | HR 71 | Temp 98.7°F | Wt 188.4 lb

## 2022-03-27 DIAGNOSIS — K648 Other hemorrhoids: Secondary | ICD-10-CM

## 2022-03-27 NOTE — Progress Notes (Unsigned)
Patient follow-ups today for banding of hemorrhoids   Summary of history :She states that she has been having persistent rectal bleeding.  Usually after bowel movement bright red blood on the tissue paper and in the toilet bowl.  No pain during the process.  No NSAID use.  No nosebleeds.  No hematemesis. 05/14/2020 flexible sigmoidoscopy: Grade 2 internal hemorrhoids were noted.  No other bleeding from any other site was noted.        First round:01/11/2022: LL banded Second round:02/21/2022: RA column banded    Interval history   02/21/2022-03/27/2022  No bleeding since her last visit only some irritation in the exterior  Digital rectal exam performed in the presence of a chaperone. External anal findings: Mild degree of excoriation in the perianal area no prolapsing hemorrhoids noted Internal findings: , No masses, no blood on glove noticed.    PROCEDURE NOTE: The patient presents with symptomatic grade 2 hemorrhoids, unresponsive to maximal medical therapy, requesting rubber band ligation of his/her hemorrhoidal disease.  All risks, benefits and alternative forms of therapy were described and informed consent was obtained.  In the Left Lateral Decubitus position (if anoscopy is performed) anoscopic examination revealed grade 2 hemorrhoids in the RP position(s).   The decision was made to band the RP internal hemorrhoid, and the [place] was used to perform band ligation without complication.  Digital anorectal examination was then performed to assure proper positioning of the band, and to adjust the banded tissue as required.  The patient was discharged home without pain or other issues.  Dietary and behavioral recommendations were given and (if necessary - prescriptions were given), along with follow-up instructions.  The patient will return as needed for follow-up and possible additional banding as required.  No complications were encountered and the patient tolerated the  procedure well.   Plan:  Avoid constipation.  Commence on stool softeners if not already on Suggest to use Desitin externally in the perianal area due to excoriation and advised to avoid excess aggressive cleaning.  Follow-up: As needed  Dr [name] MD,MRCP [facility]) Gastroenterology/Hepatology Pager: [phone]

## 2022-03-27 NOTE — Progress Notes (Unsigned)
LMP  (LMP Unknown)    Subjective:    Patient ID: Mckenzie Gutierrez, female    DOB: 1922/01/09, 88 y.o.   MRN: [id number]  HPI: Mckenzie Gutierrez is a 88 y.o. female  No chief complaint on file.    AFIB Patient's daughter states that her AFIB is well controlled.  She continues to follow up with Cardiology. Had an updated ECHO done and waiting on results.   HYPERTENSION Hypertension status: controlled  Satisfied with current treatment? yes Duration of hypertension: years BP monitoring frequency:  rarely BP range: 137/52 BP medication side effects:  no Medication compliance: excellent compliance Previous BP meds:amlodipine, losartan and lasix. Aspirin: no Recurrent headaches: no Visual changes: no Palpitations: no Dyspnea: yes Chest pain: no Lower extremity edema: yes Dizzy/lightheaded: no  HYPOTHYROIDISM Thyroid control status:controlled Satisfied with current treatment? no Medication side effects: no Medication compliance: excellent compliance Etiology of hypothyroidism:  Recent dose adjustment:no Fatigue: no Cold intolerance: no Heat intolerance: no Weight gain: no Weight loss: no Constipation: no Diarrhea/loose stools: no Palpitations: no Lower extremity edema: no Anxiety/depressed mood: no  DEPRESSION Well controlled.  Related to fatigue per the patient.    [place] Office Visit from 12/20/2021 in [place]  PHQ-9 Total Score 0      RECTAL BLEEDING Patient states she has been having rectal bleeding that has been ongoing.  She stopped taking ibuprofen and the bleeding has improved.  She does see Dr. Vicente Males and the blood is related to her hemorrhoids.   Relevant past medical, surgical, family and social history reviewed and updated as indicated. Interim medical history since our last visit reviewed. Allergies and medications reviewed and updated.  Review of Systems  Constitutional:  Positive for fatigue. Negative for fever and  unexpected weight change.  Eyes:  Negative for visual disturbance.  Respiratory:  Negative for cough, chest tightness and shortness of breath.   Cardiovascular:  Negative for chest pain, palpitations and leg swelling.  Gastrointestinal:  Positive for anal bleeding.  Endocrine: Negative for cold intolerance.  Neurological:  Negative for dizziness and headaches.  Psychiatric/Behavioral:  Positive for dysphoric mood.    Per HPI unless specifically indicated above     Objective:    LMP  (LMP Unknown)   Wt Readings from Last 3 Encounters:  03/14/22 188 lb 3.2 oz (85.4 kg)  02/21/22 190 lb 12.8 oz (86.5 kg)  01/19/22 190 lb 12.8 oz (86.5 kg)    Physical Exam Vitals and nursing note reviewed.  Constitutional:      General: She is not in acute distress.    Appearance: Normal appearance. She is normal weight. She is not ill-appearing, toxic-appearing or diaphoretic.  HENT:     Head: Normocephalic.     Right Ear: External ear normal.     Left Ear: External ear normal.     Nose: Nose normal.     Mouth/Throat:     Mouth: Mucous membranes are moist.     Pharynx: Oropharynx is clear.  Eyes:     General:        Right eye: No discharge.        Left eye: No discharge.     Extraocular Movements: Extraocular movements intact.     Conjunctiva/sclera: Conjunctivae normal.     Pupils: Pupils are equal, round, and reactive to light.  Cardiovascular:     Rate and Rhythm: Normal rate and regular rhythm.     Heart sounds: Murmur heard.  Pulmonary:  Effort: Pulmonary effort is normal. No respiratory distress.     Breath sounds: Normal breath sounds. No wheezing or rales.  Musculoskeletal:     Cervical back: Normal range of motion and neck supple.  Skin:    General: Skin is warm and dry.     Capillary Refill: Capillary refill takes less than 2 seconds.  Neurological:     General: No focal deficit present.     Mental Status: She is alert and oriented to person, place, and time. Mental  status is at baseline.  Psychiatric:        Mood and Affect: Mood normal.        Behavior: Behavior normal.        Thought Content: Thought content normal.        Judgment: Judgment normal.    Results for orders placed or performed in visit on 01/13/22  Methylmalonic acid, serum  Result Value Ref Range   Methylmalonic Acid, Quantitative 200 0 - 378 nmol/L  Hepatic function panel  Result Value Ref Range   Total Protein 6.7 6.5 - 8.1 g/dL   Albumin 3.9 3.5 - 5.0 g/dL   AST 29 15 - 41 U/L   ALT 14 0 - 44 U/L   Alkaline Phosphatase 60 38 - 126 U/L   Total Bilirubin 0.6 0.3 - 1.2 mg/dL   Bilirubin, Direct <0.1 0.0 - 0.2 mg/dL   Indirect Bilirubin NOT CALCULATED 0.3 - 0.9 mg/dL  Lactate dehydrogenase  Result Value Ref Range   LDH 127 98 - 192 U/L  Haptoglobin  Result Value Ref Range   Haptoglobin 126 41 - 333 mg/dL  Copper, serum  Result Value Ref Range   Copper 99 80 - 158 ug/dL  Kappa/lambda light chains  Result Value Ref Range   Kappa free light chain 32.2 (H) 3.3 - 19.4 mg/L   Lambda free light chains 15.6 5.7 - 26.3 mg/L   Kappa, lambda light chain ratio 2.06 (H) 0.26 - 1.65  Multiple Myeloma Panel (SPEP&IFE w/QIG)  Result Value Ref Range   IgG (Immunoglobin G), Serum 1,016 586 - 1,602 mg/dL   IgA 112 64 - 422 mg/dL   IgM (Immunoglobulin M), Srm 27 26 - 217 mg/dL   Total Protein ELP 6.3 6.0 - 8.5 g/dL   Albumin SerPl Elph-Mcnc 3.7 2.9 - 4.4 g/dL   Alpha 1 0.3 0.0 - 0.4 g/dL   Alpha2 Glob SerPl Elph-Mcnc 0.6 0.4 - 1.0 g/dL   B-Globulin SerPl Elph-Mcnc 0.8 0.7 - 1.3 g/dL   Gamma Glob SerPl Elph-Mcnc 0.9 0.4 - 1.8 g/dL   M Protein SerPl Elph-Mcnc 0.2 (H) Not Observed g/dL   Globulin, Total 2.6 2.2 - 3.9 g/dL   Albumin/Glob SerPl 1.5 0.7 - 1.7   IFE 1 Comment (A)    Please Note Comment   TSH  Result Value Ref Range   TSH 1.961 0.350 - 4.500 uIU/mL  Iron and TIBC  Result Value Ref Range   Iron 55 28 - 170 ug/dL   TIBC 330 250 - 450 ug/dL   Saturation Ratios 17  10.4 - 31.8 %   UIBC 275 ug/dL  Ferritin  Result Value Ref Range   Ferritin 33 11 - 307 ng/mL  Retic Panel  Result Value Ref Range   Retic Ct Pct 3.7 (H) 0.4 - 3.1 %   RBC. 2.91 (L) 3.87 - 5.11 MIL/uL   Retic Count, Absolute 106.2 19.0 - 186.0 K/uL   Immature Retic Fract 25.5 (H) 2.3 -  15.9 %   Reticulocyte Hemoglobin 33.4 >27.9 pg  CBC with Differential/Platelet  Result Value Ref Range   WBC 7.2 4.0 - 10.5 K/uL   RBC 2.92 (L) 3.87 - 5.11 MIL/uL   Hemoglobin 9.8 (L) 12.0 - 15.0 g/dL   HCT 30.7 (L) 36.0 - 46.0 %   MCV 105.1 (H) 80.0 - 100.0 fL   MCH 33.6 26.0 - 34.0 pg   MCHC 31.9 30.0 - 36.0 g/dL   RDW 17.7 (H) 11.5 - 15.5 %   Platelets 222 150 - 400 K/uL   nRBC 1.1 (H) 0.0 - 0.2 %   Neutrophils Relative % 64 %   Neutro Abs 4.6 1.7 - 7.7 K/uL   Lymphocytes Relative 26 %   Lymphs Abs 1.9 0.7 - 4.0 K/uL   Monocytes Relative 8 %   Monocytes Absolute 0.6 0.1 - 1.0 K/uL   Eosinophils Relative 1 %   Eosinophils Absolute 0.1 0.0 - 0.5 K/uL   Basophils Relative 1 %   Basophils Absolute 0.1 0.0 - 0.1 K/uL   Immature Granulocytes 0 %   Abs Immature Granulocytes 0.02 0.00 - 0.07 K/uL  Technologist smear review  Result Value Ref Range   WBC MORPHOLOGY Normal RBC, WBC, and platelet    RBC MORPHOLOGY Normal RBC, WBC, and platelet    Tech Review Normal RBC, WBC, and platelet       Assessment & Plan:   Problem List Items Addressed This Visit      Cardiovascular and Mediastinum   Hypertension   Atrial fibrillation (HCC)   Exudative age-related macular degeneration of left eye with active choroidal neovascularization (HCC)     Endocrine   Hypothyroid     Hematopoietic and Hemostatic   Acquired thrombophilia ([place])     Other   Major depression in complete remission ([place])     Follow up plan: No follow-ups on file.

## 2022-03-28 ENCOUNTER — Encounter: Payer: Self-pay | Admitting: Nurse Practitioner

## 2022-03-28 ENCOUNTER — Ambulatory Visit (INDEPENDENT_AMBULATORY_CARE_PROVIDER_SITE_OTHER): Payer: PPO | Admitting: Nurse Practitioner

## 2022-03-28 DIAGNOSIS — H353221 Exudative age-related macular degeneration, left eye, with active choroidal neovascularization: Secondary | ICD-10-CM | POA: Diagnosis not present

## 2022-03-28 DIAGNOSIS — E039 Hypothyroidism, unspecified: Secondary | ICD-10-CM

## 2022-03-28 DIAGNOSIS — D6869 Other thrombophilia: Secondary | ICD-10-CM

## 2022-03-28 DIAGNOSIS — F3342 Major depressive disorder, recurrent, in full remission: Secondary | ICD-10-CM

## 2022-03-28 DIAGNOSIS — I4891 Unspecified atrial fibrillation: Secondary | ICD-10-CM

## 2022-03-28 DIAGNOSIS — I10 Essential (primary) hypertension: Secondary | ICD-10-CM

## 2022-03-28 NOTE — Assessment & Plan Note (Signed)
Chronic.  Controlled.  Continue with current medication regimen.  Labs ordered today.  Return to clinic in 3 months for reevaluation.  Call sooner if concerns arise.   

## 2022-03-28 NOTE — Assessment & Plan Note (Signed)
Chronic.  Controlled.  Continue with current medication regimen of Eliquis 2.'5mg'$  BID.  Continue to follow up with Cardiology.  Had recent visit with Cardiology and there were no concerns.  Labs ordered today.  Return to clinic in 3 months for reevaluation.  Call sooner if concerns arise.

## 2022-03-28 NOTE — Assessment & Plan Note (Signed)
Continue to follow up with Dr. Wallace Going.  If concerns arise please follow up in office.

## 2022-03-28 NOTE — Assessment & Plan Note (Signed)
Chronic. Well controlled. Continue with current medication regimen of Amlodipine, Lasix and Losartan.  Labs ordered today.  Return to clinic in 3 months for reevaluation.  Call sooner if concerns arise.

## 2022-03-28 NOTE — Assessment & Plan Note (Signed)
Labs ordered today.  Will make recommendations based on lab results. ?

## 2022-03-29 LAB — COMPREHENSIVE METABOLIC PANEL
ALT: 12 IU/L (ref 0–32)
AST: 21 IU/L (ref 0–40)
Albumin/Globulin Ratio: 1.8 (ref 1.2–2.2)
Albumin: 4.1 g/dL (ref 3.6–4.6)
Alkaline Phosphatase: 79 IU/L (ref 44–121)
BUN/Creatinine Ratio: 10 — ABNORMAL LOW (ref 12–28)
BUN: 9 mg/dL — ABNORMAL LOW (ref 10–36)
Bilirubin Total: 0.5 mg/dL (ref 0.0–1.2)
CO2: 25 mmol/L (ref 20–29)
Calcium: 9.2 mg/dL (ref 8.7–10.3)
Chloride: 103 mmol/L (ref 96–106)
Creatinine, Ser: 0.88 mg/dL (ref 0.57–1.00)
Globulin, Total: 2.3 g/dL (ref 1.5–4.5)
Glucose: 124 mg/dL — ABNORMAL HIGH (ref 70–99)
Potassium: 3.6 mmol/L (ref 3.5–5.2)
Sodium: 142 mmol/L (ref 134–144)
Total Protein: 6.4 g/dL (ref 6.0–8.5)
eGFR: 60 mL/min/{1.73_m2} (ref >59)

## 2022-03-29 LAB — CBC WITH DIFFERENTIAL/PLATELET
Basophils Absolute: 0.1 10*3/uL (ref 0.0–0.2)
Basos: 1 %
EOS (ABSOLUTE): 0.1 10*3/uL (ref 0.0–0.4)
Eos: 1 %
Hematocrit: 30 % — ABNORMAL LOW (ref 34.0–46.6)
Hemoglobin: 9.9 g/dL — ABNORMAL LOW (ref 11.1–15.9)
Immature Grans (Abs): 0 10*3/uL (ref 0.0–0.1)
Immature Granulocytes: 1 %
Lymphocytes Absolute: 1.5 10*3/uL (ref 0.7–3.1)
Lymphs: 26 %
MCH: 32.9 pg (ref 26.6–33.0)
MCHC: 33 g/dL (ref 31.5–35.7)
MCV: 100 fL — ABNORMAL HIGH (ref 79–97)
Monocytes Absolute: 0.6 10*3/uL (ref 0.1–0.9)
Monocytes: 10 %
NRBC: 1 % — ABNORMAL HIGH (ref 0–0)
Neutrophils Absolute: 3.6 10*3/uL (ref 1.4–7.0)
Neutrophils: 61 %
Platelets: 223 10*3/uL (ref 150–450)
RBC: 3.01 x10E6/uL — ABNORMAL LOW (ref 3.77–5.28)
RDW: 16 % — ABNORMAL HIGH (ref 11.7–15.4)
WBC: 5.8 10*3/uL (ref 3.4–10.8)

## 2022-03-29 LAB — T4, FREE: Free T4: 1.25 ng/dL (ref 0.82–1.77)

## 2022-03-29 LAB — TSH: TSH: 2.89 u[IU]/mL (ref 0.450–4.500)

## 2022-03-29 NOTE — Telephone Encounter (Signed)
Pt has future OV schedule for 06/28/22, will refill medication.  Requested Prescriptions  Pending Prescriptions Disp Refills  . amLODipine (NORVASC) 5 MG tablet [Pharmacy Med Name: AMLODIPINE BESYLATE 5 MG TAB] 90 tablet 0    Sig: TAKE 1 TABLET BY MOUTH DAILY **NEED APPOINTMENT BEFORE ADDITIONAL REFILLS**     Cardiovascular: Calcium Channel Blockers 2 Passed - 03/27/2022  1:12 PM      Passed - Last BP in normal range    BP Readings from Last 1 Encounters:  03/28/22 139/69         Passed - Last Heart Rate in normal range    Pulse Readings from Last 1 Encounters:  03/28/22 72         Passed - Valid encounter within last 6 months    Recent Outpatient Visits          Yesterday Primary hypertension   [facility] Jon Billings, NP   3 months ago Primary hypertension   [facility] Jon Billings, NP   6 months ago Exudative age-related macular degeneration of left eye with active choroidal neovascularization ([place])   [facility] Jon Billings, NP   8 months ago Viral upper respiratory tract infection   [facility] Jon Billings, NP   10 months ago Anemia, unspecified type   [facility] Jon Billings, NP      Future Appointments            In 3 months Jon Billings, NP [facility] [place] [place]

## 2022-03-29 NOTE — Progress Notes (Signed)
Good Morning.  Ms. Alcalde labs are stable.  Continue with current medication regimen.  No concerns a this time.  Follow up as discussed.

## 2022-04-06 ENCOUNTER — Encounter: Payer: Self-pay | Admitting: Gastroenterology

## 2022-04-10 DIAGNOSIS — I480 Paroxysmal atrial fibrillation: Secondary | ICD-10-CM | POA: Diagnosis not present

## 2022-04-10 DIAGNOSIS — I350 Nonrheumatic aortic (valve) stenosis: Secondary | ICD-10-CM | POA: Diagnosis not present

## 2022-04-10 DIAGNOSIS — Z8673 Personal history of transient ischemic attack (TIA), and cerebral infarction without residual deficits: Secondary | ICD-10-CM | POA: Diagnosis not present

## 2022-04-10 DIAGNOSIS — Z9889 Other specified postprocedural states: Secondary | ICD-10-CM | POA: Diagnosis not present

## 2022-04-10 DIAGNOSIS — I739 Peripheral vascular disease, unspecified: Secondary | ICD-10-CM | POA: Diagnosis not present

## 2022-04-10 DIAGNOSIS — R5381 Other malaise: Secondary | ICD-10-CM | POA: Diagnosis not present

## 2022-04-10 DIAGNOSIS — I6523 Occlusion and stenosis of bilateral carotid arteries: Secondary | ICD-10-CM | POA: Diagnosis not present

## 2022-04-10 DIAGNOSIS — I10 Essential (primary) hypertension: Secondary | ICD-10-CM | POA: Diagnosis not present

## 2022-04-10 DIAGNOSIS — I38 Endocarditis, valve unspecified: Secondary | ICD-10-CM | POA: Diagnosis not present

## 2022-04-10 DIAGNOSIS — R5382 Chronic fatigue, unspecified: Secondary | ICD-10-CM | POA: Diagnosis not present

## 2022-04-10 DIAGNOSIS — E782 Mixed hyperlipidemia: Secondary | ICD-10-CM | POA: Diagnosis not present

## 2022-04-13 DIAGNOSIS — H6123 Impacted cerumen, bilateral: Secondary | ICD-10-CM | POA: Diagnosis not present

## 2022-04-13 DIAGNOSIS — H903 Sensorineural hearing loss, bilateral: Secondary | ICD-10-CM | POA: Diagnosis not present

## 2022-04-18 DIAGNOSIS — H353211 Exudative age-related macular degeneration, right eye, with active choroidal neovascularization: Secondary | ICD-10-CM | POA: Diagnosis not present

## 2022-04-18 DIAGNOSIS — H353231 Exudative age-related macular degeneration, bilateral, with active choroidal neovascularization: Secondary | ICD-10-CM | POA: Diagnosis not present

## 2022-04-18 DIAGNOSIS — H353221 Exudative age-related macular degeneration, left eye, with active choroidal neovascularization: Secondary | ICD-10-CM | POA: Diagnosis not present

## 2022-04-25 ENCOUNTER — Inpatient Hospital Stay: Payer: PPO | Attending: Oncology

## 2022-04-25 DIAGNOSIS — K219 Gastro-esophageal reflux disease without esophagitis: Secondary | ICD-10-CM | POA: Diagnosis not present

## 2022-04-25 DIAGNOSIS — D539 Nutritional anemia, unspecified: Secondary | ICD-10-CM | POA: Insufficient documentation

## 2022-04-25 DIAGNOSIS — M810 Age-related osteoporosis without current pathological fracture: Secondary | ICD-10-CM | POA: Diagnosis not present

## 2022-04-25 DIAGNOSIS — E785 Hyperlipidemia, unspecified: Secondary | ICD-10-CM | POA: Insufficient documentation

## 2022-04-25 DIAGNOSIS — I10 Essential (primary) hypertension: Secondary | ICD-10-CM | POA: Insufficient documentation

## 2022-04-25 DIAGNOSIS — K5900 Constipation, unspecified: Secondary | ICD-10-CM | POA: Insufficient documentation

## 2022-04-25 DIAGNOSIS — Z79899 Other long term (current) drug therapy: Secondary | ICD-10-CM | POA: Diagnosis not present

## 2022-04-25 DIAGNOSIS — E079 Disorder of thyroid, unspecified: Secondary | ICD-10-CM | POA: Insufficient documentation

## 2022-04-25 DIAGNOSIS — Z79890 Hormone replacement therapy (postmenopausal): Secondary | ICD-10-CM | POA: Diagnosis not present

## 2022-04-25 DIAGNOSIS — D472 Monoclonal gammopathy: Secondary | ICD-10-CM | POA: Diagnosis not present

## 2022-04-25 LAB — RETIC PANEL
Immature Retic Fract: 21.5 % — ABNORMAL HIGH (ref 2.3–15.9)
RBC.: 3.54 MIL/uL — ABNORMAL LOW (ref 3.87–5.11)
Retic Count, Absolute: 66.9 10*3/uL (ref 19.0–186.0)
Retic Ct Pct: 1.9 % (ref 0.4–3.1)
Reticulocyte Hemoglobin: 33.8 pg (ref >27.9)

## 2022-04-25 LAB — CBC WITH DIFFERENTIAL/PLATELET
Abs Immature Granulocytes: 0 10*3/uL (ref 0.00–0.07)
Band Neutrophils: 0 %
Basophils Absolute: 0.1 10*3/uL (ref 0.0–0.1)
Basophils Relative: 1 %
Blasts: 0 %
Eosinophils Absolute: 0.1 10*3/uL (ref 0.0–0.5)
Eosinophils Relative: 1 %
HCT: 35.6 % — ABNORMAL LOW (ref 36.0–46.0)
Hemoglobin: 11.5 g/dL — ABNORMAL LOW (ref 12.0–15.0)
Lymphocytes Relative: 31 %
Lymphs Abs: 2.1 10*3/uL (ref 0.7–4.0)
MCH: 32.8 pg (ref 26.0–34.0)
MCHC: 32.3 g/dL (ref 30.0–36.0)
MCV: 101.4 fL — ABNORMAL HIGH (ref 80.0–100.0)
Metamyelocytes Relative: 0 %
Monocytes Absolute: 0.8 10*3/uL (ref 0.1–1.0)
Monocytes Relative: 11 %
Myelocytes: 0 %
Neutro Abs: 3.8 10*3/uL (ref 1.7–7.7)
Neutrophils Relative %: 56 %
Other: 0 %
Platelets: 194 10*3/uL (ref 150–400)
Promyelocytes Relative: 0 %
RBC: 3.51 MIL/uL — ABNORMAL LOW (ref 3.87–5.11)
RDW: 17 % — ABNORMAL HIGH (ref 11.5–15.5)
WBC: 6.9 10*3/uL (ref 4.0–10.5)
nRBC: 0 /100 WBC
nRBC: 1.3 % — ABNORMAL HIGH (ref 0.0–0.2)

## 2022-04-25 LAB — IRON AND TIBC
Iron: 88 ug/dL (ref 28–170)
Saturation Ratios: 29 % (ref 10.4–31.8)
TIBC: 307 ug/dL (ref 250–450)
UIBC: 219 ug/dL

## 2022-04-25 LAB — LACTATE DEHYDROGENASE: LDH: 146 U/L (ref 98–192)

## 2022-04-25 LAB — FERRITIN: Ferritin: 63 ng/mL (ref 11–307)

## 2022-04-25 LAB — TECHNOLOGIST SMEAR REVIEW
Plt Morphology: NORMAL
RBC MORPHOLOGY: NORMAL
WBC MORPHOLOGY: NORMAL

## 2022-04-27 ENCOUNTER — Inpatient Hospital Stay: Payer: PPO | Admitting: Oncology

## 2022-04-27 ENCOUNTER — Encounter: Payer: Self-pay | Admitting: Oncology

## 2022-04-27 VITALS — BP 145/71 | HR 60 | Temp 98.7°F | Resp 19 | Wt 186.4 lb

## 2022-04-27 DIAGNOSIS — D539 Nutritional anemia, unspecified: Secondary | ICD-10-CM | POA: Diagnosis not present

## 2022-04-27 DIAGNOSIS — D472 Monoclonal gammopathy: Secondary | ICD-10-CM | POA: Diagnosis not present

## 2022-04-28 NOTE — Progress Notes (Signed)
Hematology/Oncology Consult note [phone] [phone]         Patient Care Team: Jon Billings, NP as PCP - General Conception Chancy, DDS as Referring Physician (Dentistry) Leandrew Koyanagi, MD as Referring Physician (Ophthalmology)  REFERRING PROVIDER: Jon Billings, NP  CHIEF COMPLAINTS/REASON FOR VISIT:  Anemia  HISTORY OF PRESENTING ILLNESS:   Mckenzie Gutierrez is a  88 y.o.  female with PMH listed below was seen in consultation at the request of  Jon Billings, NP  for evaluation of  anemia.   Patient is on Eliquis for A Fib.  + rectal bleeding  was seen by Dr.Anna and had flexible sigmoidoscopy on 05/14/20 and was found to have interval hemorrhoids. She had one of the hemorrhoid banded.  Her baseline hemoglobin was around 11. It was noted recently that her hemoglobin has decreased to 9-10s. MCV is 101 Patient was referred to establish care with hematology for further evaluation.  She is on oral ferrous sulfate once daily since last fall. + constipation, she takes laxatives.  Denies fever, chills, night sweats, abdominal pain + fatigue.  + hard hearing.    INTERVAL HISTORY Mckenzie Gutierrez is a 88 y.o. female who has above history reviewed by me today presents for follow up visit for anemia,.  She was accompanied by her daughter,  She feels well. Very occasionally she has small amount of blood in stool when constipated.  Fatigue is stable.   Review of Systems  Constitutional:  Positive for fatigue. Negative for chills and fever.  HENT:   Positive for hearing loss. Negative for voice change.   Eyes:  Negative for eye problems.  Respiratory:  Negative for chest tightness and cough.   Cardiovascular:  Negative for chest pain.  Gastrointestinal:  Positive for blood in stool. Negative for abdominal distention and abdominal pain.  Endocrine: Negative for hot flashes.  Genitourinary:  Negative for difficulty urinating and frequency.    Musculoskeletal:  Negative for arthralgias.  Skin:  Negative for itching and rash.  Neurological:  Negative for extremity weakness.  Hematological:  Negative for adenopathy.  Psychiatric/Behavioral:  Negative for confusion.     MEDICAL HISTORY:  Past Medical History:  Diagnosis Date   Anxiety    Atrial premature beats    Bradycardia    Carotid stenosis    Depression    GERD (gastroesophageal reflux disease)    Hyperlipidemia    Hypertension    Low back pain    Osteoporosis    Thyroid disease    Urinary frequency     SURGICAL HISTORY: Past Surgical History:  Procedure Laterality Date   andarteretomy     CARPAL TUNNEL RELEASE     FLEXIBLE SIGMOIDOSCOPY N/A 05/13/2020   Procedure: FLEXIBLE SIGMOIDOSCOPY;  Surgeon: [name], MD;  Location[facility] ENDOSCOPY;  Service: Gastroenterology;  Laterality: N/A;  Unsedated   WRIST SURGERY      SOCIAL HISTORY: Social History   Socioeconomic History   Marital status: Widowed    Spouse name: Not on file   Number of children: Not on file   Years of education: Not on file   Highest education level: Not on file  Occupational History   Not on file  Tobacco Use   Smoking status: Never   Smokeless tobacco: Never  Vaping Use   Vaping Use: Never used  Substance and Sexual Activity   Alcohol use: No    Alcohol/week: 0.0 standard drinks of alcohol   Drug use: No   Sexual  activity: Not Currently  Other Topics Concern   Not on file  Social History Narrative   Not on file   Social Determinants of Health   Financial Resource Strain: Low Risk  (02/14/2021)   Overall Financial Resource Strain (CARDIA)    Difficulty of Paying Living Expenses: Not hard at all  Food Insecurity: No Food Insecurity (02/14/2021)   Hunger Vital Sign    Worried About Running Out of Food in the Last Year: Never true    Ran Out of Food in the Last Year: Never true  Transportation Needs: No Transportation Needs (02/14/2021)   PRAPARE - Armed forces logistics/support/administrative officer (Medical): No    Lack of Transportation (Non-Medical): No  Physical Activity: Inactive (02/14/2021)   Exercise Vital Sign    Days of Exercise per Week: 0 days    Minutes of Exercise per Session: 0 min  Stress: No Stress Concern Present (02/14/2021)   [place]    Feeling of Stress : Not at all  Social Connections: Moderately Isolated (02/11/2020)   Social Connection and Isolation Panel [NHANES]    Frequency of Communication with Friends and Family: More than three times a week    Frequency of Social Gatherings with Friends and Family: More than three times a week    Attends Religious Services: More than 4 times per year    Active Member of [employer] or Organizations: No    Attends Archivist Meetings: Never    Marital Status: Widowed  Intimate Partner Violence: Not At Risk (07/20/2017)   Humiliation, Afraid, Rape, and Kick questionnaire    Fear of Current or Ex-Partner: No    Emotionally Abused: No    Physically Abused: No    Sexually Abused: No    FAMILY HISTORY: Family History  Problem Relation Age of Onset   Heart disease Mother    Stroke Father    Diabetes Father    Stroke Sister     ALLERGIES:  is allergic to celebrex [celecoxib], codeine sulfate, hydrochlorothiazide, hytrin [terazosin], plendil [felodipine], nitrofuran derivatives, and nitrofurantoin.  MEDICATIONS:  Current Outpatient Medications  Medication Sig Dispense Refill   acetaminophen (TYLENOL) 500 MG tablet Take 1,000 mg by mouth 2 (two) times daily.     albuterol (VENTOLIN HFA) 108 (90 Base) MCG/ACT inhaler INHALE 2 PUFFS INTO THE LUNGS EVERY SIX HOURS AS NEEDED FOR WHEEZING OR SHORTNESS OF BREATH 8.5 g 1   amLODipine (NORVASC) 5 MG tablet TAKE 1 TABLET BY MOUTH DAILY **NEED APPOINTMENT BEFORE ADDITIONAL REFILLS** 90 tablet 0   apixaban (ELIQUIS) 2.5 MG TABS tablet Take 2.5 mg by mouth 2 (two) times daily.      ferrous sulfate 325 (65 FE) MG tablet Take 325 mg by mouth at bedtime.     furosemide (LASIX) 40 MG tablet Take 1 tablet by mouth daily.     hydrocortisone (ANUSOL-HC) 25 MG suppository Place 1 suppository (25 mg total) rectally 2 (two) times daily. 12 suppository 1   levothyroxine (SYNTHROID) 75 MCG tablet TAKE 1 TABLET EVERY DAY ON EMPTY STOMACHWITH A GLASS OF WATER AT LEAST 30-60 MINBEFORE BREAKFAST 90 tablet 1   LINZESS 145 MCG CAPS capsule TAKE ONE CAPSULE EACH MORNING BEFORE BREAKFAST 90 capsule 1   losartan (COZAAR) 100 MG tablet TAKE ONE TABLET EVERY DAY 90 tablet 0   Multiple Vitamin (MULTIVITAMIN) tablet Take 1 tablet by mouth daily.     pantoprazole (PROTONIX) 20 MG tablet TAKE 1  TABLET BY MOUTH DAILY 90 tablet 0   polyethylene glycol (MIRALAX / GLYCOLAX) 17 g packet Take 17 g by mouth daily.     sertraline (ZOLOFT) 25 MG tablet Take 1 tablet (25 mg total) by mouth daily. 90 tablet 1   beta carotene w/minerals (OCUVITE) tablet Take 1 tablet by mouth daily.     erythromycin ophthalmic ointment  (Patient not taking: Reported on 04/27/2022)     No current facility-administered medications for this visit.     PHYSICAL EXAMINATION: ECOG PERFORMANCE STATUS: 1 - Symptomatic but completely ambulatory Vitals:   04/27/22 1350  BP: (!) 145/71  Pulse: 60  Resp: 19  Temp: 98.7 F (37.1 C)  SpO2: 97%   Filed Weights   04/27/22 1350  Weight: 186 lb 6.4 oz (84.6 kg)    Physical Exam Constitutional:      General: She is not in acute distress.    Comments: Ambulates independently.   HENT:     Head: Normocephalic and atraumatic.  Eyes:     General: No scleral icterus. Cardiovascular:     Rate and Rhythm: Normal rate.     Heart sounds: Murmur heard.  Pulmonary:     Effort: Pulmonary effort is normal. No respiratory distress.     Breath sounds: No wheezing.  Abdominal:     General: There is no distension.     Palpations: Abdomen is soft.  Musculoskeletal:        General: No  deformity. Normal range of motion.     Cervical back: Normal range of motion and neck supple.  Skin:    General: Skin is warm and dry.     Coloration: Skin is not pale.     Findings: No erythema or rash.  Neurological:     Mental Status: She is alert. Mental status is at baseline.  Psychiatric:        Mood and Affect: Mood normal.     LABORATORY DATA:  I have reviewed the data as listed    Latest Ref Rng & Units 04/25/2022   11:44 AM 03/28/2022   10:37 AM 01/13/2022    1:25 PM  CBC  WBC 4.0 - 10.5 K/uL 6.9  5.8  7.2   Hemoglobin 12.0 - 15.0 g/dL 11.5  9.9  9.8   Hematocrit 36.0 - 46.0 % 35.6  30.0  30.7   Platelets 150 - 400 K/uL 194  223  222       Latest Ref Rng & Units 03/28/2022   10:37 AM 01/13/2022    1:25 PM 12/20/2021   11:31 AM  CMP  Glucose 70 - 99 mg/dL 124   112   BUN 10 - 36 mg/dL 9   11   Creatinine 0.57 - 1.00 mg/dL 0.88   0.84   Sodium 134 - 144 mmol/L 142   147   Potassium 3.5 - 5.2 mmol/L 3.6   3.8   Chloride 96 - 106 mmol/L 103   104   CO2 20 - 29 mmol/L 25   26   Calcium 8.7 - 10.3 mg/dL 9.2   9.5   Total Protein 6.0 - 8.5 g/dL 6.4  6.7  6.7   Total Bilirubin 0.0 - 1.2 mg/dL 0.5  0.6  0.3   Alkaline Phos 44 - 121 IU/L 79  60  74   AST 0 - 40 IU/L _0 ALT 0 - 32 IU/L 12  14  14  Lab Results  Component Value Date   IRON 88 04/25/2022   TIBC 307 04/25/2022   FERRITIN 63 04/25/2022          RADIOGRAPHIC STUDIES: I have personally reviewed the radiological images as listed and agreed with the findings in the report. No results found.    ASSESSMENT & PLAN:  1. Anemia, macrocytic   2. MGUS (monoclonal gammopathy of unknown significance)    Chronic macrocytic anemia,  Hemoglobin has improved to 11.5. iron panel is stable. Labs reviewed and discussed with patient and her daughter Continue observation.   #MGUS, discussed with the patient that this is likely a premyeloma condition.  M protein currently is at 0.2.   Recommend  observation.   Follow up in 1 year.   Orders Placed This Encounter  Procedures   CBC with Differential/Platelet    Standing Status:   Future    Standing Expiration Date:   04/28/2023   Iron and TIBC    Standing Status:   Future    Standing Expiration Date:   04/28/2023   Ferritin    Standing Status:   Future    Standing Expiration Date:   04/28/2023   Multiple Myeloma Panel (SPEP&IFE w/QIG)    Standing Status:   Future    Standing Expiration Date:   04/27/2023   Kappa/lambda light chains    Standing Status:   Future    Standing Expiration Date:   04/27/2023   Comprehensive metabolic panel    Standing Status:   Future    Standing Expiration Date:   04/27/2023    All questions were answered. The patient knows to call the clinic with any problems questions or concerns.  cc Jon Billings, NP    Return of visit: Follow-up 4 months. Thank you for this kind referral and the opportunity to participate in the care of this patient. A copy of today's note is routed to referring provider   Earlie Server, MD, PhD [facility] Hematology Oncology 04/28/2022

## 2022-05-23 ENCOUNTER — Other Ambulatory Visit: Payer: Self-pay | Admitting: Nurse Practitioner

## 2022-05-24 NOTE — Telephone Encounter (Signed)
Requested Prescriptions  Pending Prescriptions Disp Refills  . losartan (COZAAR) 100 MG tablet [Pharmacy Med Name: LOSARTAN POTASSIUM 100 MG TAB] 90 tablet 1    Sig: TAKE ONE TABLET EVERY DAY     Cardiovascular:  Angiotensin Receptor Blockers Failed - 05/23/2022 12:25 PM      Failed - Last BP in normal range    BP Readings from Last 1 Encounters:  04/27/22 (!) 145/71         Passed - Cr in normal range and within 180 days    Creatinine, Ser  Date Value Ref Range Status  03/28/2022 0.88 0.57 - 1.00 mg/dL Final         Passed - K in normal range and within 180 days    Potassium  Date Value Ref Range Status  03/28/2022 3.6 3.5 - 5.2 mmol/L Final         Passed - Patient is not pregnant      Passed - Valid encounter within last 6 months    Recent Outpatient Visits          1 month ago Primary hypertension   [place], Karen, NP   5 months ago Primary hypertension   [place], [name], NP   8 months ago Exudative age-related macular degeneration of left eye with active choroidal neovascularization ([place])   [facility] Jon Billings, NP   10 months ago Viral upper respiratory tract infection   [facility] Jon Billings, NP   1 year ago Anemia, unspecified type   [facility] Jon Billings, NP      Future Appointments            In 1 month Jon Billings, NP [facility], [place]

## 2022-06-02 DIAGNOSIS — Z961 Presence of intraocular lens: Secondary | ICD-10-CM | POA: Diagnosis not present

## 2022-06-06 ENCOUNTER — Other Ambulatory Visit: Payer: Self-pay | Admitting: Gastroenterology

## 2022-06-06 ENCOUNTER — Other Ambulatory Visit: Payer: Self-pay | Admitting: Nurse Practitioner

## 2022-06-06 NOTE — Telephone Encounter (Signed)
Requested Prescriptions  Pending Prescriptions Disp Refills  . pantoprazole (PROTONIX) 20 MG tablet [Pharmacy Med Name: PANTOPRAZOLE SODIUM 20 MG DR TAB] 90 tablet 0    Sig: TAKE 1 TABLET BY MOUTH DAILY     Gastroenterology: Proton Pump Inhibitors Passed - 06/06/2022 10:02 AM      Passed - Valid encounter within last 12 months    Recent Outpatient Visits          2 months ago Primary hypertension   [facility] Jon Billings, NP   5 months ago Primary hypertension   [facility] Jon Billings, NP   8 months ago Exudative age-related macular degeneration of left eye with active choroidal neovascularization [facility])   [facility] Jon Billings, NP   10 months ago Viral upper respiratory tract infection   [facility] Jon Billings, NP   1 year ago Anemia, unspecified type   [facility] Jon Billings, NP      Future Appointments            In 3 weeks Jon Billings, NP [facility], [place]

## 2022-06-08 DIAGNOSIS — Z8673 Personal history of transient ischemic attack (TIA), and cerebral infarction without residual deficits: Secondary | ICD-10-CM | POA: Diagnosis not present

## 2022-06-08 DIAGNOSIS — K219 Gastro-esophageal reflux disease without esophagitis: Secondary | ICD-10-CM | POA: Diagnosis not present

## 2022-06-08 DIAGNOSIS — I6523 Occlusion and stenosis of bilateral carotid arteries: Secondary | ICD-10-CM | POA: Diagnosis not present

## 2022-06-08 DIAGNOSIS — R0609 Other forms of dyspnea: Secondary | ICD-10-CM | POA: Diagnosis not present

## 2022-06-08 DIAGNOSIS — I739 Peripheral vascular disease, unspecified: Secondary | ICD-10-CM | POA: Diagnosis not present

## 2022-06-08 DIAGNOSIS — E782 Mixed hyperlipidemia: Secondary | ICD-10-CM | POA: Diagnosis not present

## 2022-06-08 DIAGNOSIS — I480 Paroxysmal atrial fibrillation: Secondary | ICD-10-CM | POA: Diagnosis not present

## 2022-06-08 DIAGNOSIS — R011 Cardiac murmur, unspecified: Secondary | ICD-10-CM | POA: Diagnosis not present

## 2022-06-08 DIAGNOSIS — I10 Essential (primary) hypertension: Secondary | ICD-10-CM | POA: Diagnosis not present

## 2022-06-08 DIAGNOSIS — E669 Obesity, unspecified: Secondary | ICD-10-CM | POA: Diagnosis not present

## 2022-06-15 ENCOUNTER — Other Ambulatory Visit: Payer: Self-pay | Admitting: Nurse Practitioner

## 2022-06-16 NOTE — Telephone Encounter (Signed)
Requested Prescriptions  Pending Prescriptions Disp Refills  . amLODipine (NORVASC) 5 MG tablet [Pharmacy Med Name: AMLODIPINE BESYLATE 5 MG TAB] 90 tablet 0    Sig: TAKE 1 TABLET BY MOUTH DAILY **NEED APPOINTMENT BEFORE ADDITIONAL REFILLS**     Cardiovascular: Calcium Channel Blockers 2 Failed - 06/15/2022  1:00 PM      Failed - Last BP in normal range    BP Readings from Last 1 Encounters:  04/27/22 (!) 145/71         Passed - Last Heart Rate in normal range    Pulse Readings from Last 1 Encounters:  04/27/22 60         Passed - Valid encounter within last 6 months    Recent Outpatient Visits          2 months ago Primary hypertension   [facility] Jon Billings, NP   5 months ago Primary hypertension   [facility] Jon Billings, NP   9 months ago Exudative age-related macular degeneration of left eye with active choroidal neovascularization ([place])   [facility] Jon Billings, NP   10 months ago Viral upper respiratory tract infection   [facility] Jon Billings, NP   1 year ago Anemia, unspecified type   [facility] Jon Billings, NP      Future Appointments            In 1 week Jon Billings, NP [facility], [place]

## 2022-06-27 ENCOUNTER — Other Ambulatory Visit: Payer: Self-pay | Admitting: Nurse Practitioner

## 2022-06-27 NOTE — Progress Notes (Signed)
LMP  (LMP Unknown)    Subjective:    Patient ID: Mckenzie Gutierrez, female    DOB: [birth year], 88 y.o.   MRN: [id number]  HPI: Mckenzie Gutierrez is a 88 y.o. female presenting on 06/28/2022 for comprehensive medical examination. Current medical complaints include:{Blank single:19197::"none","***"}  She currently lives with: Menopausal Symptoms: {Blank single:19197::"yes","no"}  AFIB Patient's daughter states that her AFIB is well controlled.  She continues to follow up with Cardiology. Had an updated ECHO done and waiting on results.   HYPERTENSION Followed by Vascular, Dr. Lucky Cowboy yearly for Carotid Artery Stenosis. Hypertension status: controlled  Satisfied with current treatment? yes Duration of hypertension: years BP monitoring frequency:  rarely BP range: 130s/60s BP medication side effects:  no Medication compliance: excellent compliance Previous BP meds:amlodipine, losartan and lasix. Aspirin: no Recurrent headaches: no Visual changes: no Palpitations: no Dyspnea: yes Chest pain: no Lower extremity edema: yes Dizzy/lightheaded: no  HYPOTHYROIDISM Thyroid control status:controlled Satisfied with current treatment? no Medication side effects: no Medication compliance: excellent compliance Etiology of hypothyroidism:  Recent dose adjustment:no Fatigue: no Cold intolerance: no Heat intolerance: no Weight gain: no Weight loss: no Constipation: no Diarrhea/loose stools: no Palpitations: no Lower extremity edema: no Anxiety/depressed mood: no  DEPRESSION Well controlled.  Related to fatigue per the patient.    [place] [facility] Visit from 12/20/2021 in [place]  PHQ-9 Total Score 0       ANEMIA Followed by Dr. Tasia Catchings.   Anemia status: {Blank single:19197::"controlled","uncontrolled","better","worse","exacerbated","stable"} Etiology of anemia: Duration of anemia treatment:  Compliance with treatment: {Blank single:19197::"excellent  compliance","good compliance","fair compliance","poor compliance"} Iron supplementation side effects: {Blank single:19197::"yes","no"} Severity of anemia: {Blank single:19197::"mild","moderate","severe"} Fatigue: {Blank single:19197::"yes","no"} Decreased exercise tolerance: {Blank single:19197::"yes","no"}  Dyspnea on exertion: {Blank single:19197::"yes","no"} Palpitations: {Blank single:19197::"yes","no"} Bleeding: {Blank single:19197::"yes","no"} Pica: {Blank single:19197::"yes","no"}   Functional Status Survey:       03/28/2022   10:25 AM 12/20/2021   11:11 AM 09/19/2021   11:39 AM 02/14/2021    9:51 AM 08/12/2020   10:53 AM  Fall Risk   Falls in the past year? '1 1 1 1 '$ 0  Number falls in past yr: '1 1 1 1 '$ 0  Injury with Fall? 0 1 1 0 0  Risk for fall due to : History of fall(s) History of fall(s) History of fall(s);Impaired balance/gait;Impaired mobility Impaired balance/gait;Medication side effect   Follow up Falls evaluation completed Falls evaluation completed Falls evaluation completed Falls evaluation completed;Education provided;Falls prevention discussed Falls evaluation completed    Depression Screen    12/20/2021   11:11 AM 09/19/2021   11:39 AM 03/18/2021   11:31 AM 02/14/2021    9:51 AM 08/12/2020   10:54 AM  Depression screen PHQ 2/9  Decreased Interest 0 0 0 0 1  Down, Depressed, Hopeless 0 0 1 0 1  PHQ - 2 Score 0 0 1 0 2  Altered sleeping 0 0 0  0  Tired, decreased energy 0 1 1  0  Change in appetite 0 0 0  0  Feeling bad or failure about yourself  0 1 0  1  Trouble concentrating 0 0 0  0  Moving slowly or fidgety/restless 0 0 0  0  Suicidal thoughts 0 0 0  0  PHQ-9 Score 0 '2 2  3  '$ Difficult doing work/chores Not difficult at all Somewhat difficult Not difficult at all  Somewhat difficult     Advanced Directives Does patient have a HCPOA?    {Blank single:19197::"yes","no"} If yes,  name and contact information:  Does patient have a living will or MOST  form?  {Blank single:19197::"yes","no"}  Past Medical History:  Past Medical History:  Diagnosis Date   Anxiety    Atrial premature beats    Bradycardia    Carotid stenosis    Depression    GERD (gastroesophageal reflux disease)    Hyperlipidemia    Hypertension    Low back pain    Osteoporosis    Thyroid disease    Urinary frequency     Surgical History:  Past Surgical History:  Procedure Laterality Date   andarteretomy     CARPAL TUNNEL RELEASE     FLEXIBLE SIGMOIDOSCOPY N/A 05/13/2020   Procedure: FLEXIBLE SIGMOIDOSCOPY;  Surgeon: [name], MD;  Location[facility] ENDOSCOPY;  Service: Gastroenterology;  Laterality: N/A;  Unsedated   WRIST SURGERY      Medications:  Current Outpatient Medications on File Prior to Visit  Medication Sig   acetaminophen (TYLENOL) 500 MG tablet Take 1,000 mg by mouth 2 (two) times daily.   albuterol (VENTOLIN HFA) 108 (90 Base) MCG/ACT inhaler INHALE 2 PUFFS INTO THE LUNGS EVERY SIX HOURS AS NEEDED FOR WHEEZING OR SHORTNESS OF BREATH   amLODipine (NORVASC) 5 MG tablet TAKE 1 TABLET BY MOUTH DAILY **NEED APPOINTMENT BEFORE ADDITIONAL REFILLS**   apixaban (ELIQUIS) 2.5 MG TABS tablet Take 2.5 mg by mouth 2 (two) times daily.   beta carotene w/minerals (OCUVITE) tablet Take 1 tablet by mouth daily.   erythromycin ophthalmic ointment  (Patient not taking: Reported on 04/27/2022)   ferrous sulfate 325 (65 FE) MG tablet Take 325 mg by mouth at bedtime.   furosemide (LASIX) 40 MG tablet Take 1 tablet by mouth daily.   hydrocortisone (ANUSOL-HC) 25 MG suppository Place 1 suppository (25 mg total) rectally 2 (two) times daily.   levothyroxine (SYNTHROID) 75 MCG tablet TAKE 1 TABLET EVERY DAY ON EMPTY STOMACHWITH A GLASS OF WATER AT LEAST 30-60 MINBEFORE BREAKFAST   LINZESS 145 MCG CAPS capsule TAKE ONE CAPSULE EACH MORNING BEFORE BREAKFAST   losartan (COZAAR) 100 MG tablet TAKE ONE TABLET EVERY DAY   Multiple Vitamin (MULTIVITAMIN) tablet Take 1 tablet  by mouth daily.   pantoprazole (PROTONIX) 20 MG tablet TAKE 1 TABLET BY MOUTH DAILY   polyethylene glycol (MIRALAX / GLYCOLAX) 17 g packet Take 17 g by mouth daily.   sertraline (ZOLOFT) 25 MG tablet Take 1 tablet (25 mg total) by mouth daily.   No current facility-administered medications on file prior to visit.    Allergies:  Allergies  Allergen Reactions   Celebrex [Celecoxib] Other (See Comments)    Hard stools   Codeine Sulfate Other (See Comments)   Hydrochlorothiazide    Hytrin [Terazosin]    Plendil [Felodipine] Diarrhea and Nausea Only   Nitrofuran Derivatives Swelling and Rash   Nitrofurantoin Rash and Swelling    Social History:  Social History   Socioeconomic History   Marital status: Widowed    Spouse name: Not on file   Number of children: Not on file   Years of education: Not on file   Highest education level: Not on file  Occupational History   Not on file  Tobacco Use   Smoking status: Never   Smokeless tobacco: Never  Vaping Use   Vaping Use: Never used  Substance and Sexual Activity   Alcohol use: No    Alcohol/week: 0.0 standard drinks of alcohol   Drug use: No   Sexual activity: Not Currently  Other  Topics Concern   Not on file  Social History Narrative   Not on file   Social Determinants of Health   Financial Resource Strain: Low Risk  (02/14/2021)   Overall Financial Resource Strain (CARDIA)    Difficulty of Paying Living Expenses: Not hard at all  Food Insecurity: No Food Insecurity (02/14/2021)   Hunger Vital Sign    Worried About Running Out of Food in the Last Year: Never true    Ran Out of Food in the Last Year: Never true  Transportation Needs: No Transportation Needs (02/14/2021)   PRAPARE - Hydrologist (Medical): No    Lack of Transportation (Non-Medical): No  Physical Activity: Inactive (02/14/2021)   Exercise Vital Sign    Days of Exercise per Week: 0 days    Minutes of Exercise per Session: 0 min   Stress: No Stress Concern Present (02/14/2021)   [place]    Feeling of Stress : Not at all  Social Connections: Moderately Isolated (02/11/2020)   Social Connection and Isolation Panel [NHANES]    Frequency of Communication with Friends and Family: More than three times a week    Frequency of Social Gatherings with Friends and Family: More than three times a week    Attends Religious Services: More than 4 times per year    Active Member of [employer] or Organizations: No    Attends Archivist Meetings: Never    Marital Status: Widowed  Intimate Partner Violence: Not At Risk (07/20/2017)   Humiliation, Afraid, Rape, and Kick questionnaire    Fear of Current or Ex-Partner: No    Emotionally Abused: No    Physically Abused: No    Sexually Abused: No   Social History   Tobacco Use  Smoking Status Never  Smokeless Tobacco Never   Social History   Substance and Sexual Activity  Alcohol Use No   Alcohol/week: 0.0 standard drinks of alcohol    Family History:  Family History  Problem Relation Age of Onset   Heart disease Mother    Stroke Father    Diabetes Father    Stroke Sister     Past medical history, surgical history, medications, allergies, family history and social history reviewed with patient today and changes made to appropriate areas of the chart.   ROS  All other ROS negative except what is listed above and in the HPI.      Objective:    LMP  (LMP Unknown)   Wt Readings from Last 3 Encounters:  04/27/22 186 lb 6.4 oz (84.6 kg)  03/28/22 188 lb 8 oz (85.5 kg)  03/27/22 188 lb 6.4 oz (85.5 kg)    No results found.  Physical Exam     07/20/2017    2:08 PM  6CIT Screen  What Year? 0 points  What month? 0 points  What time? 0 points  Count back from 20 0 points  Months in reverse 0 points  Repeat phrase 0 points  Total Score 0 points    Cognitive Testing - 6-CIT  Correct?  Score   What year is it? {YES NO:22349} {Numbers; 0-4:31231} Yes = 0    No = 4  What month is it? {YES NO:22349} {Numbers; 0-4:31231} Yes = 0    No = 3  Remember:     Pia Mau, [place], [place]     What time is it? {YES NO:22349} {Numbers; 0-4:31231}  Yes = 0    No = 3  Count backwards from 20 to 1 {YES NO:22349} {Numbers; 0-4:31231} Correct = 0    1 error = 2   More than 1 error = 4  Say the months of the year in reverse. {YES NO:22349} {Numbers; 0-4:31231} Correct = 0    1 error = 2   More than 1 error = 4  What address did I ask you to remember? {YES NO:22349} {NUMBERS; 0-10:5044} Correct = 0  1 error = 2    2 error = 4    3 error = 6    4 error = 8    All wrong = 10       TOTAL SCORE  {Numbers; 2-95:28413}/24   Interpretation:  {Desc; normal/abnormal:11317::"Normal"}  Normal (0-7) Abnormal (8-28)   Results for orders placed or performed in visit on 04/25/22  Retic Panel  Result Value Ref Range   Retic Ct Pct 1.9 0.4 - 3.1 %   RBC. 3.54 (L) 3.87 - 5.11 MIL/uL   Retic Count, Absolute 66.9 19.0 - 186.0 K/uL   Immature Retic Fract 21.5 (H) 2.3 - 15.9 %   Reticulocyte Hemoglobin 33.8 >27.9 pg  Ferritin  Result Value Ref Range   Ferritin 63 11 - 307 ng/mL  Lactate dehydrogenase  Result Value Ref Range   LDH 146 98 - 192 U/L  Iron and TIBC  Result Value Ref Range   Iron 88 28 - 170 ug/dL   TIBC 307 250 - 450 ug/dL   Saturation Ratios 29 10.4 - 31.8 %   UIBC 219 ug/dL  CBC with Differential/Platelet  Result Value Ref Range   WBC 6.9 4.0 - 10.5 K/uL   RBC 3.51 (L) 3.87 - 5.11 MIL/uL   Hemoglobin 11.5 (L) 12.0 - 15.0 g/dL   HCT 35.6 (L) 36.0 - 46.0 %   MCV 101.4 (H) 80.0 - 100.0 fL   MCH 32.8 26.0 - 34.0 pg   MCHC 32.3 30.0 - 36.0 g/dL   RDW 17.0 (H) 11.5 - 15.5 %   Platelets 194 150 - 400 K/uL   nRBC 1.3 (H) 0.0 - 0.2 %   Neutrophils Relative % 56 %   Lymphocytes Relative 31 %   Monocytes Relative 11 %   Eosinophils Relative 1 %   Basophils Relative 1 %   Band  Neutrophils 0 %   Metamyelocytes Relative 0 %   Myelocytes 0 %   Promyelocytes Relative 0 %   Blasts 0 %   nRBC 0 0 /100 WBC   Other 0 %   Neutro Abs 3.8 1.7 - 7.7 K/uL   Lymphs Abs 2.1 0.7 - 4.0 K/uL   Monocytes Absolute 0.8 0.1 - 1.0 K/uL   Eosinophils Absolute 0.1 0.0 - 0.5 K/uL   Basophils Absolute 0.1 0.0 - 0.1 K/uL   Abs Immature Granulocytes 0.00 0.00 - 0.07 K/uL  Technologist smear review  Result Value Ref Range   WBC MORPHOLOGY Normal RBC, WBC, and platelet    RBC MORPHOLOGY Normal RBC, WBC, and platelet    Plt Morphology Normal RBC, WBC, and platelet       Assessment & Plan:   Problem List Items Addressed This Visit       Cardiovascular and Mediastinum   Hypertension - Primary   Atrial fibrillation (HCC)   Exudative age-related macular degeneration of left eye with active choroidal neovascularization [facility])     Endocrine   Hypothyroid     Hematopoietic  and Hemostatic   Acquired thrombophilia ([place])   Other Visit Diagnoses     Elevated glucose            Preventative Services:  AAA screening:  Health Risk Assessment and Personalized Prevention Plan: Bone Mass Measurements: Breast Cancer Screening: CVD Screening:  Cervical Cancer Screening: Colon Cancer Screening:  Depression Screening:  Diabetes Screening:  Glaucoma Screening:  Hepatitis B vaccine: Hepatitis C screening:  HIV Screening: Flu Vaccine: Lung cancer Screening: Obesity Screening:  Pneumonia Vaccines (2): STI Screening:  Follow up plan: No follow-ups on file.   LABORATORY TESTING:  - Pap smear: {Blank ZOXWRU:04540::"JWJ done","not applicable","up to date","done elsewhere"}  IMMUNIZATIONS:   - Tdap: Tetanus vaccination status reviewed: {tetanus status:315746}. - Influenza: {Blank single:19197::"Up to date","Administered today","Postponed to flu season","Refused","Given elsewhere"} - Pneumovax: {Blank single:19197::"Up to date","Administered today","Not  applicable","Refused","Given elsewhere"} - Prevnar: {Blank single:19197::"Up to date","Administered today","Not applicable","Refused","Given elsewhere"} - Zostavax vaccine: {Blank single:19197::"Up to date","Administered today","Not applicable","Refused","Given elsewhere"}  SCREENING: -Mammogram: {Blank single:19197::"Up to date","Ordered today","Not applicable","Refused","Done elsewhere"}  - Colonoscopy: {Blank single:19197::"Up to date","Ordered today","Not applicable","Refused","Done elsewhere"}  - Bone Density: {Blank single:19197::"Up to date","Ordered today","Not applicable","Refused","Done elsewhere"}  -Hearing Test: {Blank single:19197::"Up to date","Ordered today","Not applicable","Refused","Done elsewhere"}  -Spirometry: {Blank single:19197::"Up to date","Ordered today","Not applicable","Refused","Done elsewhere"}   PATIENT COUNSELING:   Advised to take 1 mg of folate supplement per day if capable of pregnancy.   Sexuality: Discussed sexually transmitted diseases, partner selection, use of condoms, avoidance of unintended pregnancy  and contraceptive alternatives.   Advised to avoid cigarette smoking.  I discussed with the patient that most people either abstain from alcohol or drink within safe limits (<=14/week and <=4 drinks/occasion for males, <=7/weeks and <= 3 drinks/occasion for females) and that the risk for alcohol disorders and other health effects rises proportionally with the number of drinks per week and how often a drinker exceeds daily limits.  Discussed cessation/primary prevention of drug use and availability of treatment for abuse.   Diet: Encouraged to adjust caloric intake to maintain  or achieve ideal body weight, to reduce intake of dietary saturated fat and total fat, to limit sodium intake by avoiding high sodium foods and not adding table salt, and to maintain adequate dietary potassium and calcium preferably from fresh fruits, vegetables, and low-fat dairy  products.    stressed the importance of regular exercise  Injury prevention: Discussed safety belts, safety helmets, smoke detector, smoking near bedding or upholstery.   Dental health: Discussed importance of regular tooth brushing, flossing, and dental visits.    NEXT PREVENTATIVE PHYSICAL DUE IN 1 YEAR. No follow-ups on file.

## 2022-06-28 ENCOUNTER — Encounter: Payer: Self-pay | Admitting: Nurse Practitioner

## 2022-06-28 ENCOUNTER — Ambulatory Visit (INDEPENDENT_AMBULATORY_CARE_PROVIDER_SITE_OTHER): Payer: PPO | Admitting: Nurse Practitioner

## 2022-06-28 VITALS — BP 138/86 | HR 69 | Temp 97.4°F | Wt 185.7 lb

## 2022-06-28 DIAGNOSIS — D6869 Other thrombophilia: Secondary | ICD-10-CM | POA: Diagnosis not present

## 2022-06-28 DIAGNOSIS — R7309 Other abnormal glucose: Secondary | ICD-10-CM | POA: Diagnosis not present

## 2022-06-28 DIAGNOSIS — D539 Nutritional anemia, unspecified: Secondary | ICD-10-CM

## 2022-06-28 DIAGNOSIS — Z23 Encounter for immunization: Secondary | ICD-10-CM | POA: Diagnosis not present

## 2022-06-28 DIAGNOSIS — I6523 Occlusion and stenosis of bilateral carotid arteries: Secondary | ICD-10-CM | POA: Diagnosis not present

## 2022-06-28 DIAGNOSIS — I4891 Unspecified atrial fibrillation: Secondary | ICD-10-CM | POA: Diagnosis not present

## 2022-06-28 DIAGNOSIS — I10 Essential (primary) hypertension: Secondary | ICD-10-CM | POA: Diagnosis not present

## 2022-06-28 DIAGNOSIS — H353221 Exudative age-related macular degeneration, left eye, with active choroidal neovascularization: Secondary | ICD-10-CM

## 2022-06-28 DIAGNOSIS — Z0000 Encounter for general adult medical examination without abnormal findings: Secondary | ICD-10-CM

## 2022-06-28 DIAGNOSIS — Z7189 Other specified counseling: Secondary | ICD-10-CM

## 2022-06-28 DIAGNOSIS — E785 Hyperlipidemia, unspecified: Secondary | ICD-10-CM

## 2022-06-28 DIAGNOSIS — E039 Hypothyroidism, unspecified: Secondary | ICD-10-CM | POA: Diagnosis not present

## 2022-06-28 NOTE — Assessment & Plan Note (Signed)
Chronic. Well controlled. Continue with current medication regimen of Amlodipine, Lasix and Losartan.  Labs ordered today.  Return to clinic in 3 months for reevaluation.  Call sooner if concerns arise.

## 2022-06-28 NOTE — Assessment & Plan Note (Signed)
Labs ordered at visit today.  Will make recommendations based on lab results.   

## 2022-06-28 NOTE — Assessment & Plan Note (Signed)
Chronic. Followed by Dr. Lucky Cowboy at vascular annually.  No change from previous studies.

## 2022-06-28 NOTE — Assessment & Plan Note (Signed)
Chronic.  Controlled.  Continue with current medication regimen.  Labs ordered today.  Return to clinic in 3 months for reevaluation.  Call sooner if concerns arise.   

## 2022-06-28 NOTE — Assessment & Plan Note (Signed)
A voluntary discussion about advance care planning including the explanation and discussion of advance directives was extensively discussed  with the patient for 10 minutes with patient and her son, Mckenzie Gutierrez present.  Explanation about the health care proxy and Living will was reviewed and packet with forms with explanation of how to fill them out was given.  During this discussion, the patient was able to identify a health care proxy as her daughter Mckenzie Gutierrez and plans to bring the paperwork required.  Patient was offered a separate [place] visit for further assistance with forms.

## 2022-06-28 NOTE — Telephone Encounter (Signed)
Requested Prescriptions  Pending Prescriptions Disp Refills  . sertraline (ZOLOFT) 25 MG tablet [Pharmacy Med Name: SERTRALINE HCL 25 MG TAB] 90 tablet 1    Sig: TAKE 1 TABLET BY MOUTH DAILY     Psychiatry:  Antidepressants - SSRI - sertraline Passed - 06/27/2022 11:10 AM      Passed - AST in normal range and within 360 days    AST  Date Value Ref Range Status  03/28/2022 21 0 - 40 IU/L Final         Passed - ALT in normal range and within 360 days    ALT  Date Value Ref Range Status  03/28/2022 12 0 - 32 IU/L Final         Passed - Completed PHQ-2 or PHQ-9 in the last 360 days      Passed - Valid encounter within last 6 months    Recent Outpatient Visits          Today Atrial fibrillation, unspecified type [facility])   [facility] Jon Billings, NP   3 months ago Primary hypertension   [facility] Jon Billings, NP   6 months ago Primary hypertension   [facility] Jon Billings, NP   9 months ago Exudative age-related macular degeneration of left eye with active choroidal neovascularization [facility])   [facility] Jon Billings, NP   11 months ago Viral upper respiratory tract infection   [facility] Jon Billings, NP

## 2022-06-28 NOTE — Assessment & Plan Note (Addendum)
Chronic.  Controlled.  Continue with current medication regimen of Eliquis 2.'5mg'$  BID.  Continue to follow up with Cardiology.  Saw Dr. Clayborn Bigness in October and follows up with him every 4 months. Labs ordered today.  Return to clinic in 3 months for reevaluation.  Call sooner if concerns arise.

## 2022-06-29 LAB — CBC WITH DIFFERENTIAL/PLATELET
Basophils Absolute: 0.1 10*3/uL (ref 0.0–0.2)
Basos: 2 %
EOS (ABSOLUTE): 0.1 10*3/uL (ref 0.0–0.4)
Eos: 1 %
Hematocrit: 37.7 % (ref 34.0–46.6)
Hemoglobin: 12.4 g/dL (ref 11.1–15.9)
Immature Grans (Abs): 0 10*3/uL (ref 0.0–0.1)
Immature Granulocytes: 0 %
Lymphocytes Absolute: 2.4 10*3/uL (ref 0.7–3.1)
Lymphs: 29 %
MCH: 32.5 pg (ref 26.6–33.0)
MCHC: 32.9 g/dL (ref 31.5–35.7)
MCV: 99 fL — ABNORMAL HIGH (ref 79–97)
Monocytes Absolute: 0.7 10*3/uL (ref 0.1–0.9)
Monocytes: 9 %
NRBC: 1 % — ABNORMAL HIGH (ref 0–0)
Neutrophils Absolute: 4.9 10*3/uL (ref 1.4–7.0)
Neutrophils: 59 %
Platelets: 169 10*3/uL (ref 150–450)
RBC: 3.82 x10E6/uL (ref 3.77–5.28)
RDW: 16.3 % — ABNORMAL HIGH (ref 11.7–15.4)
WBC: 8.2 10*3/uL (ref 3.4–10.8)

## 2022-06-29 LAB — COMPREHENSIVE METABOLIC PANEL
ALT: 10 IU/L (ref 0–32)
AST: 21 IU/L (ref 0–40)
Albumin/Globulin Ratio: 1.5 (ref 1.2–2.2)
Albumin: 4.4 g/dL (ref 3.6–4.6)
Alkaline Phosphatase: 74 IU/L (ref 44–121)
BUN/Creatinine Ratio: 18 (ref 12–28)
BUN: 16 mg/dL (ref 10–36)
Bilirubin Total: 0.6 mg/dL (ref 0.0–1.2)
CO2: 27 mmol/L (ref 20–29)
Calcium: 9.9 mg/dL (ref 8.7–10.3)
Chloride: 101 mmol/L (ref 96–106)
Creatinine, Ser: 0.88 mg/dL (ref 0.57–1.00)
Globulin, Total: 2.9 g/dL (ref 1.5–4.5)
Glucose: 106 mg/dL — ABNORMAL HIGH (ref 70–99)
Potassium: 3.9 mmol/L (ref 3.5–5.2)
Sodium: 141 mmol/L (ref 134–144)
Total Protein: 7.3 g/dL (ref 6.0–8.5)
eGFR: 59 mL/min/{1.73_m2} — ABNORMAL LOW (ref >59)

## 2022-06-29 LAB — LIPID PANEL
Chol/HDL Ratio: 2.8 ratio (ref 0.0–4.4)
Cholesterol, Total: 160 mg/dL (ref 100–199)
HDL: 58 mg/dL (ref >39)
LDL Chol Calc (NIH): 84 mg/dL (ref 0–99)
Triglycerides: 101 mg/dL (ref 0–149)
VLDL Cholesterol Cal: 18 mg/dL (ref 5–40)

## 2022-06-29 LAB — T4, FREE: Free T4: 1.33 ng/dL (ref 0.82–1.77)

## 2022-06-29 LAB — VITAMIN B12: Vitamin B-12: 1033 pg/mL (ref 232–1245)

## 2022-06-29 LAB — HEMOGLOBIN A1C
Est. average glucose Bld gHb Est-mCnc: 126 mg/dL
Hgb A1c MFr Bld: 6 % — ABNORMAL HIGH (ref 4.8–5.6)

## 2022-06-29 LAB — TSH: TSH: 2.74 u[IU]/mL (ref 0.450–4.500)

## 2022-06-30 NOTE — Progress Notes (Signed)
Good Morning [name].  Sorry I missed you this week.  [name] lab work looks great.  Her anemia has improved to normal range so no longer bleeding anywhere. Her A1c remains stable in the prediabetic range.  No need for medications.  Her cholesterol and thyroid labs look great.  No concerns at this time.  Follow up as discussed.

## 2022-07-03 ENCOUNTER — Encounter (INDEPENDENT_AMBULATORY_CARE_PROVIDER_SITE_OTHER): Payer: Self-pay

## 2022-07-04 DIAGNOSIS — H353231 Exudative age-related macular degeneration, bilateral, with active choroidal neovascularization: Secondary | ICD-10-CM | POA: Diagnosis not present

## 2022-07-04 DIAGNOSIS — H353211 Exudative age-related macular degeneration, right eye, with active choroidal neovascularization: Secondary | ICD-10-CM | POA: Diagnosis not present

## 2022-07-04 DIAGNOSIS — H353131 Nonexudative age-related macular degeneration, bilateral, early dry stage: Secondary | ICD-10-CM | POA: Diagnosis not present

## 2022-07-04 DIAGNOSIS — H353221 Exudative age-related macular degeneration, left eye, with active choroidal neovascularization: Secondary | ICD-10-CM | POA: Diagnosis not present

## 2022-07-04 DIAGNOSIS — D3131 Benign neoplasm of right choroid: Secondary | ICD-10-CM | POA: Diagnosis not present

## 2022-07-11 ENCOUNTER — Encounter: Payer: Self-pay | Admitting: Nurse Practitioner

## 2022-07-11 ENCOUNTER — Ambulatory Visit (INDEPENDENT_AMBULATORY_CARE_PROVIDER_SITE_OTHER): Payer: PPO | Admitting: Nurse Practitioner

## 2022-07-11 VITALS — BP 107/47 | HR 70 | Wt 186.9 lb

## 2022-07-11 DIAGNOSIS — R300 Dysuria: Secondary | ICD-10-CM

## 2022-07-11 DIAGNOSIS — N3000 Acute cystitis without hematuria: Secondary | ICD-10-CM

## 2022-07-11 LAB — URINALYSIS, ROUTINE W REFLEX MICROSCOPIC
Bilirubin, UA: NEGATIVE
Glucose, UA: NEGATIVE
Ketones, UA: NEGATIVE
Nitrite, UA: NEGATIVE
Protein,UA: NEGATIVE
Specific Gravity, UA: 1.01 (ref 1.005–1.030)
Urobilinogen, Ur: 0.2 mg/dL (ref 0.2–1.0)
pH, UA: 7 (ref 5.0–7.5)

## 2022-07-11 LAB — MICROSCOPIC EXAMINATION

## 2022-07-11 MED ORDER — SULFAMETHOXAZOLE-TRIMETHOPRIM 800-160 MG PO TABS: 1.0000 | ORAL_TABLET | Freq: Two times a day (BID) | ORAL | 0 refills | Status: DC

## 2022-07-11 MED ORDER — CEFPODOXIME PROXETIL 100 MG PO TABS: 100.0000 mg | ORAL_TABLET | Freq: Two times a day (BID) | ORAL | 0 refills | Status: DC

## 2022-07-11 NOTE — Addendum Note (Signed)
Addended by: Jon Billings on: 07/11/2022 03:03 PM   Modules accepted: Orders

## 2022-07-11 NOTE — Assessment & Plan Note (Addendum)
Will treat with Bactrim. Complete course of antibiotics. Bring urine back prior to starting medication. Will send for culture. Increase fluid intake. FU if not improved.

## 2022-07-11 NOTE — Progress Notes (Addendum)
BP (!) 107/47   Pulse 70   Wt 186 lb 14.4 oz (84.8 kg)   LMP  (LMP Unknown)   SpO2 95%   BMI 29.27 kg/m    Subjective:    Patient ID: Mckenzie Gutierrez, female    DOB: 07/22/1922, 87 y.o.   MRN: [id number]  HPI: Mckenzie Gutierrez is a 88 y.o. female  Chief Complaint  Patient presents with   Urinary Urgency    Pt reports urinary urgency and dysuria onset last night.    URINARY SYMPTOMS Symptoms started last night Dysuria: burning Urinary frequency: yes Urgency: yes Small volume voids: no Symptom severity: no Urinary incontinence: no Foul odor: no Hematuria: no Abdominal pain: yes Back pain: no Suprapubic pain/pressure: yes Flank pain: no Fever: no Vomiting: no Relief with cranberry juice: no Relief with pyridium: no Status: better/worse/stable Previous urinary tract infection: no Recurrent urinary tract infection: no History of sexually transmitted disease: no Treatments attempted: None  Relevant past medical, surgical, family and social history reviewed and updated as indicated. Interim medical history since our last visit reviewed. Allergies and medications reviewed and updated.  Review of Systems  Constitutional:  Negative for fever.  Gastrointestinal:  Positive for abdominal pain. Negative for vomiting.  Genitourinary:  Positive for dysuria, frequency and urgency. Negative for decreased urine volume, flank pain and hematuria.  Musculoskeletal:  Negative for back pain.    Per HPI unless specifically indicated above     Objective:    BP (!) 107/47   Pulse 70   Wt 186 lb 14.4 oz (84.8 kg)   LMP  (LMP Unknown)   SpO2 95%   BMI 29.27 kg/m   Wt Readings from Last 3 Encounters:  07/11/22 186 lb 14.4 oz (84.8 kg)  06/28/22 185 lb 11.2 oz (84.2 kg)  04/27/22 186 lb 6.4 oz (84.6 kg)    Physical Exam Vitals and nursing note reviewed.  Constitutional:      General: She is not in acute distress.    Appearance: Normal appearance. She is normal weight.  She is not ill-appearing, toxic-appearing or diaphoretic.  HENT:     Head: Normocephalic.     Right Ear: External ear normal.     Left Ear: External ear normal.     Nose: Nose normal.     Mouth/Throat:     Mouth: Mucous membranes are moist.     Pharynx: Oropharynx is clear.  Eyes:     General:        Right eye: No discharge.        Left eye: No discharge.     Extraocular Movements: Extraocular movements intact.     Conjunctiva/sclera: Conjunctivae normal.     Pupils: Pupils are equal, round, and reactive to light.  Cardiovascular:     Rate and Rhythm: Normal rate and regular rhythm.     Heart sounds: No murmur heard. Pulmonary:     Effort: Pulmonary effort is normal. No respiratory distress.     Breath sounds: Normal breath sounds. No wheezing or rales.  Abdominal:     General: Abdomen is flat. Bowel sounds are normal. There is no distension.     Palpations: Abdomen is soft.     Tenderness: There is abdominal tenderness. There is no right CVA tenderness, left CVA tenderness or guarding.  Musculoskeletal:     Cervical back: Normal range of motion and neck supple.  Skin:    General: Skin is warm and dry.     Capillary  Refill: Capillary refill takes less than 2 seconds.  Neurological:     General: No focal deficit present.     Mental Status: She is alert and oriented to person, place, and time. Mental status is at baseline.  Psychiatric:        Mood and Affect: Mood normal.        Behavior: Behavior normal.        Thought Content: Thought content normal.        Judgment: Judgment normal.     Results for orders placed or performed in visit on 06/28/22  Comp Met (CMET)  Result Value Ref Range   Glucose 106 (H) 70 - 99 mg/dL   BUN 16 10 - 36 mg/dL   Creatinine, Ser 0.88 0.57 - 1.00 mg/dL   eGFR 59 (L) >59 mL/min/1.73   BUN/Creatinine Ratio 18 12 - 28   Sodium 141 134 - 144 mmol/L   Potassium 3.9 3.5 - 5.2 mmol/L   Chloride 101 96 - 106 mmol/L   CO2 27 20 - 29 mmol/L    Calcium 9.9 8.7 - 10.3 mg/dL   Total Protein 7.3 6.0 - 8.5 g/dL   Albumin 4.4 3.6 - 4.6 g/dL   Globulin, Total 2.9 1.5 - 4.5 g/dL   Albumin/Globulin Ratio 1.5 1.2 - 2.2   Bilirubin Total 0.6 0.0 - 1.2 mg/dL   Alkaline Phosphatase 74 44 - 121 IU/L   AST 21 0 - 40 IU/L   ALT 10 0 - 32 IU/L  Lipid Profile  Result Value Ref Range   Cholesterol, Total 160 100 - 199 mg/dL   Triglycerides 101 0 - 149 mg/dL   HDL 58 >39 mg/dL   VLDL Cholesterol Cal 18 5 - 40 mg/dL   LDL Chol Calc (NIH) 84 0 - 99 mg/dL   Chol/HDL Ratio 2.8 0.0 - 4.4 ratio  B12  Result Value Ref Range   Vitamin B-12 1,033 232 - 1,245 pg/mL  CBC w/Diff  Result Value Ref Range   WBC 8.2 3.4 - 10.8 x10E3/uL   RBC 3.82 3.77 - 5.28 x10E6/uL   Hemoglobin 12.4 11.1 - 15.9 g/dL   Hematocrit 37.7 34.0 - 46.6 %   MCV 99 (H) 79 - 97 fL   MCH 32.5 26.6 - 33.0 pg   MCHC 32.9 31.5 - 35.7 g/dL   RDW 16.3 (H) 11.7 - 15.4 %   Platelets 169 150 - 450 x10E3/uL   Neutrophils 59 Not Estab. %   Lymphs 29 Not Estab. %   Monocytes 9 Not Estab. %   Eos 1 Not Estab. %   Basos 2 Not Estab. %   Neutrophils Absolute 4.9 1.4 - 7.0 x10E3/uL   Lymphocytes Absolute 2.4 0.7 - 3.1 x10E3/uL   Monocytes Absolute 0.7 0.1 - 0.9 x10E3/uL   EOS (ABSOLUTE) 0.1 0.0 - 0.4 x10E3/uL   Basophils Absolute 0.1 0.0 - 0.2 x10E3/uL   Immature Granulocytes 0 Not Estab. %   Immature Grans (Abs) 0.0 0.0 - 0.1 x10E3/uL   NRBC 1 (H) 0 - 0 %  HgB A1c  Result Value Ref Range   Hgb A1c MFr Bld 6.0 (H) 4.8 - 5.6 %   Est. average glucose Bld gHb Est-mCnc 126 mg/dL  TSH  Result Value Ref Range   TSH 2.740 0.450 - 4.500 uIU/mL  T4, free  Result Value Ref Range   Free T4 1.33 0.82 - 1.77 ng/dL      Assessment & Plan:   Problem List Items  Addressed This Visit       Genitourinary   Acute cystitis without hematuria - Primary    Will treat with Bactrim. Complete course of antibiotics. Bring urine back prior to starting medication. Will send for culture. Increase  fluid intake. FU if not improved.       Relevant Orders   Urine Culture   Other Visit Diagnoses     Dysuria       Relevant Orders   Urinalysis, Routine w reflex microscopic        Follow up plan: Return if symptoms worsen or fail to improve.

## 2022-07-12 DIAGNOSIS — N3000 Acute cystitis without hematuria: Secondary | ICD-10-CM | POA: Diagnosis not present

## 2022-07-12 NOTE — Progress Notes (Signed)
Results discussed with patient during visit.

## 2022-07-16 ENCOUNTER — Other Ambulatory Visit: Payer: Self-pay

## 2022-07-16 ENCOUNTER — Inpatient Hospital Stay: Payer: PPO

## 2022-07-16 ENCOUNTER — Encounter: Payer: Self-pay | Admitting: Emergency Medicine

## 2022-07-16 ENCOUNTER — Inpatient Hospital Stay
Admission: EM | Admit: 2022-07-16 | Discharge: 2022-07-19 | DRG: 085 | Disposition: A | Discharge status: Hospice | Payer: PPO | Attending: Internal Medicine | Admitting: Internal Medicine

## 2022-07-16 ENCOUNTER — Emergency Department: Payer: PPO

## 2022-07-16 DIAGNOSIS — Z7901 Long term (current) use of anticoagulants: Secondary | ICD-10-CM | POA: Diagnosis not present

## 2022-07-16 DIAGNOSIS — S199XXA Unspecified injury of neck, initial encounter: Secondary | ICD-10-CM | POA: Diagnosis not present

## 2022-07-16 DIAGNOSIS — R4701 Aphasia: Secondary | ICD-10-CM | POA: Diagnosis not present

## 2022-07-16 DIAGNOSIS — Z79899 Other long term (current) drug therapy: Secondary | ICD-10-CM

## 2022-07-16 DIAGNOSIS — Z823 Family history of stroke: Secondary | ICD-10-CM

## 2022-07-16 DIAGNOSIS — I4891 Unspecified atrial fibrillation: Secondary | ICD-10-CM | POA: Diagnosis present

## 2022-07-16 DIAGNOSIS — F32A Depression, unspecified: Secondary | ICD-10-CM | POA: Diagnosis not present

## 2022-07-16 DIAGNOSIS — E039 Hypothyroidism, unspecified: Secondary | ICD-10-CM | POA: Diagnosis present

## 2022-07-16 DIAGNOSIS — R54 Age-related physical debility: Secondary | ICD-10-CM | POA: Diagnosis present

## 2022-07-16 DIAGNOSIS — S32030A Wedge compression fracture of third lumbar vertebra, initial encounter for closed fracture: Secondary | ICD-10-CM | POA: Diagnosis not present

## 2022-07-16 DIAGNOSIS — M48061 Spinal stenosis, lumbar region without neurogenic claudication: Secondary | ICD-10-CM | POA: Diagnosis not present

## 2022-07-16 DIAGNOSIS — S066X0A Traumatic subarachnoid hemorrhage without loss of consciousness, initial encounter: Secondary | ICD-10-CM | POA: Diagnosis not present

## 2022-07-16 DIAGNOSIS — S3992XA Unspecified injury of lower back, initial encounter: Secondary | ICD-10-CM | POA: Diagnosis not present

## 2022-07-16 DIAGNOSIS — D472 Monoclonal gammopathy: Secondary | ICD-10-CM | POA: Diagnosis not present

## 2022-07-16 DIAGNOSIS — S065X0D Traumatic subdural hemorrhage without loss of consciousness, subsequent encounter: Secondary | ICD-10-CM | POA: Diagnosis not present

## 2022-07-16 DIAGNOSIS — Z8249 Family history of ischemic heart disease and other diseases of the circulatory system: Secondary | ICD-10-CM

## 2022-07-16 DIAGNOSIS — E785 Hyperlipidemia, unspecified: Secondary | ICD-10-CM | POA: Diagnosis not present

## 2022-07-16 DIAGNOSIS — Z8673 Personal history of transient ischemic attack (TIA), and cerebral infarction without residual deficits: Secondary | ICD-10-CM

## 2022-07-16 DIAGNOSIS — S06A0XA Traumatic brain compression without herniation, initial encounter: Secondary | ICD-10-CM | POA: Diagnosis not present

## 2022-07-16 DIAGNOSIS — I6523 Occlusion and stenosis of bilateral carotid arteries: Secondary | ICD-10-CM | POA: Diagnosis not present

## 2022-07-16 DIAGNOSIS — I739 Peripheral vascular disease, unspecified: Secondary | ICD-10-CM | POA: Diagnosis not present

## 2022-07-16 DIAGNOSIS — I480 Paroxysmal atrial fibrillation: Secondary | ICD-10-CM | POA: Diagnosis not present

## 2022-07-16 DIAGNOSIS — S065X0A Traumatic subdural hemorrhage without loss of consciousness, initial encounter: Secondary | ICD-10-CM | POA: Diagnosis not present

## 2022-07-16 DIAGNOSIS — D62 Acute posthemorrhagic anemia: Secondary | ICD-10-CM | POA: Diagnosis not present

## 2022-07-16 DIAGNOSIS — M50222 Other cervical disc displacement at C5-C6 level: Secondary | ICD-10-CM | POA: Diagnosis not present

## 2022-07-16 DIAGNOSIS — G8191 Hemiplegia, unspecified affecting right dominant side: Secondary | ICD-10-CM | POA: Diagnosis not present

## 2022-07-16 DIAGNOSIS — R404 Transient alteration of awareness: Secondary | ICD-10-CM | POA: Diagnosis not present

## 2022-07-16 DIAGNOSIS — S065XAA Traumatic subdural hemorrhage with loss of consciousness status unknown, initial encounter: Secondary | ICD-10-CM

## 2022-07-16 DIAGNOSIS — I6200 Nontraumatic subdural hemorrhage, unspecified: Secondary | ICD-10-CM | POA: Diagnosis present

## 2022-07-16 DIAGNOSIS — R569 Unspecified convulsions: Secondary | ICD-10-CM | POA: Diagnosis not present

## 2022-07-16 DIAGNOSIS — I672 Cerebral atherosclerosis: Secondary | ICD-10-CM | POA: Diagnosis not present

## 2022-07-16 DIAGNOSIS — M50223 Other cervical disc displacement at C6-C7 level: Secondary | ICD-10-CM | POA: Diagnosis not present

## 2022-07-16 DIAGNOSIS — Z1152 Encounter for screening for COVID-19: Secondary | ICD-10-CM

## 2022-07-16 DIAGNOSIS — K219 Gastro-esophageal reflux disease without esophagitis: Secondary | ICD-10-CM | POA: Diagnosis present

## 2022-07-16 DIAGNOSIS — W1830XA Fall on same level, unspecified, initial encounter: Secondary | ICD-10-CM | POA: Diagnosis present

## 2022-07-16 DIAGNOSIS — M5136 Other intervertebral disc degeneration, lumbar region: Secondary | ICD-10-CM | POA: Diagnosis not present

## 2022-07-16 DIAGNOSIS — R0689 Other abnormalities of breathing: Secondary | ICD-10-CM | POA: Diagnosis not present

## 2022-07-16 DIAGNOSIS — D509 Iron deficiency anemia, unspecified: Secondary | ICD-10-CM | POA: Diagnosis present

## 2022-07-16 DIAGNOSIS — W19XXXA Unspecified fall, initial encounter: Secondary | ICD-10-CM | POA: Diagnosis not present

## 2022-07-16 DIAGNOSIS — G9349 Other encephalopathy: Secondary | ICD-10-CM | POA: Diagnosis not present

## 2022-07-16 DIAGNOSIS — Z66 Do not resuscitate: Secondary | ICD-10-CM | POA: Diagnosis present

## 2022-07-16 DIAGNOSIS — R29707 NIHSS score 7: Secondary | ICD-10-CM | POA: Diagnosis not present

## 2022-07-16 DIAGNOSIS — D649 Anemia, unspecified: Secondary | ICD-10-CM

## 2022-07-16 DIAGNOSIS — Z87440 Personal history of urinary (tract) infections: Secondary | ICD-10-CM

## 2022-07-16 DIAGNOSIS — Z515 Encounter for palliative care: Secondary | ICD-10-CM

## 2022-07-16 DIAGNOSIS — Z885 Allergy status to narcotic agent status: Secondary | ICD-10-CM

## 2022-07-16 DIAGNOSIS — M4856XA Collapsed vertebra, not elsewhere classified, lumbar region, initial encounter for fracture: Secondary | ICD-10-CM | POA: Diagnosis present

## 2022-07-16 DIAGNOSIS — I6201 Nontraumatic acute subdural hemorrhage: Secondary | ICD-10-CM | POA: Diagnosis present

## 2022-07-16 DIAGNOSIS — M2578 Osteophyte, vertebrae: Secondary | ICD-10-CM | POA: Diagnosis present

## 2022-07-16 DIAGNOSIS — Z833 Family history of diabetes mellitus: Secondary | ICD-10-CM

## 2022-07-16 DIAGNOSIS — Z7401 Bed confinement status: Secondary | ICD-10-CM | POA: Diagnosis not present

## 2022-07-16 DIAGNOSIS — M533 Sacrococcygeal disorders, not elsewhere classified: Secondary | ICD-10-CM | POA: Diagnosis not present

## 2022-07-16 DIAGNOSIS — I10 Essential (primary) hypertension: Secondary | ICD-10-CM | POA: Diagnosis not present

## 2022-07-16 DIAGNOSIS — Z79890 Hormone replacement therapy (postmenopausal): Secondary | ICD-10-CM

## 2022-07-16 DIAGNOSIS — Z888 Allergy status to other drugs, medicaments and biological substances status: Secondary | ICD-10-CM

## 2022-07-16 DIAGNOSIS — M47812 Spondylosis without myelopathy or radiculopathy, cervical region: Secondary | ICD-10-CM | POA: Diagnosis not present

## 2022-07-16 DIAGNOSIS — K8020 Calculus of gallbladder without cholecystitis without obstruction: Secondary | ICD-10-CM | POA: Diagnosis not present

## 2022-07-16 DIAGNOSIS — F419 Anxiety disorder, unspecified: Secondary | ICD-10-CM | POA: Diagnosis present

## 2022-07-16 DIAGNOSIS — I959 Hypotension, unspecified: Secondary | ICD-10-CM | POA: Diagnosis not present

## 2022-07-16 DIAGNOSIS — I619 Nontraumatic intracerebral hemorrhage, unspecified: Secondary | ICD-10-CM | POA: Diagnosis not present

## 2022-07-16 DIAGNOSIS — R0902 Hypoxemia: Secondary | ICD-10-CM | POA: Diagnosis not present

## 2022-07-16 LAB — CBC WITH DIFFERENTIAL/PLATELET
Abs Immature Granulocytes: 0.09 10*3/uL — ABNORMAL HIGH (ref 0.00–0.07)
Basophils Absolute: 0.1 10*3/uL (ref 0.0–0.1)
Basophils Relative: 1 %
Eosinophils Absolute: 0 10*3/uL (ref 0.0–0.5)
Eosinophils Relative: 0 %
HCT: 27.5 % — ABNORMAL LOW (ref 36.0–46.0)
Hemoglobin: 9.2 g/dL — ABNORMAL LOW (ref 12.0–15.0)
Immature Granulocytes: 1 %
Lymphocytes Relative: 6 %
Lymphs Abs: 0.6 10*3/uL — ABNORMAL LOW (ref 0.7–4.0)
MCH: 33.2 pg (ref 26.0–34.0)
MCHC: 33.5 g/dL (ref 30.0–36.0)
MCV: 99.3 fL (ref 80.0–100.0)
Monocytes Absolute: 0.8 10*3/uL (ref 0.1–1.0)
Monocytes Relative: 8 %
Neutro Abs: 8.8 10*3/uL — ABNORMAL HIGH (ref 1.7–7.7)
Neutrophils Relative %: 84 %
Platelets: 187 10*3/uL (ref 150–400)
RBC: 2.77 MIL/uL — ABNORMAL LOW (ref 3.87–5.11)
RDW: 17.6 % — ABNORMAL HIGH (ref 11.5–15.5)
WBC: 10.3 10*3/uL (ref 4.0–10.5)
nRBC: 0.8 % — ABNORMAL HIGH (ref 0.0–0.2)

## 2022-07-16 LAB — URINALYSIS, ROUTINE W REFLEX MICROSCOPIC
Bacteria, UA: NONE SEEN
Bilirubin Urine: NEGATIVE
Glucose, UA: NEGATIVE mg/dL
Hgb urine dipstick: NEGATIVE
Ketones, ur: NEGATIVE mg/dL
Nitrite: NEGATIVE
Protein, ur: NEGATIVE mg/dL
Specific Gravity, Urine: 1.011 (ref 1.005–1.030)
pH: 6 (ref 5.0–8.0)

## 2022-07-16 LAB — BASIC METABOLIC PANEL
Anion gap: 5 (ref 5–15)
BUN: 19 mg/dL (ref 8–23)
CO2: 24 mmol/L (ref 22–32)
Calcium: 8.6 mg/dL — ABNORMAL LOW (ref 8.9–10.3)
Chloride: 107 mmol/L (ref 98–111)
Creatinine, Ser: 0.77 mg/dL (ref 0.44–1.00)
GFR, Estimated: >60 mL/min (ref >60)
Glucose, Bld: 143 mg/dL — ABNORMAL HIGH (ref 70–99)
Potassium: 4.7 mmol/L (ref 3.5–5.1)
Sodium: 136 mmol/L (ref 135–145)

## 2022-07-16 LAB — PROTIME-INR
INR: 1.5 — ABNORMAL HIGH (ref 0.8–1.2)
Prothrombin Time: 18.1 seconds — ABNORMAL HIGH (ref 11.4–15.2)

## 2022-07-16 LAB — TYPE AND SCREEN
ABO/RH(D): A POS
Antibody Screen: NEGATIVE

## 2022-07-16 LAB — SARS CORONAVIRUS 2 BY RT PCR: SARS Coronavirus 2 by RT PCR: NEGATIVE

## 2022-07-16 MED ORDER — POLYETHYLENE GLYCOL 3350 17 G PO PACK: 17.0000 g | PACK | Freq: Every day | ORAL | Status: DC | PRN

## 2022-07-16 MED ORDER — ALBUTEROL SULFATE (2.5 MG/3ML) 0.083% IN NEBU: 3.0000 mL | INHALATION_SOLUTION | Freq: Four times a day (QID) | RESPIRATORY_TRACT | Status: DC | PRN

## 2022-07-16 MED ORDER — LOSARTAN POTASSIUM 50 MG PO TABS
100.0000 mg | ORAL_TABLET | Freq: Every day | ORAL | Status: DC
  Administered 2022-07-17 – 2022-07-18 (×2): 100 mg via ORAL
  Filled 2022-07-16 (×2): qty 2

## 2022-07-16 MED ORDER — ADULT MULTIVITAMIN W/MINERALS CH: 1.0000 | ORAL_TABLET | Freq: Every day | ORAL | Status: DC

## 2022-07-16 MED ORDER — LIDOCAINE 5 % EX PTCH
1.0000 | MEDICATED_PATCH | CUTANEOUS | Status: DC
  Administered 2022-07-16 – 2022-07-18 (×3): 1 via TRANSDERMAL
  Filled 2022-07-16 (×4): qty 1

## 2022-07-16 MED ORDER — POLYETHYLENE GLYCOL 3350 17 G PO PACK
17.0000 g | PACK | Freq: Every day | ORAL | Status: DC
  Administered 2022-07-17 – 2022-07-18 (×2): 17 g via ORAL
  Filled 2022-07-16 (×2): qty 1

## 2022-07-16 MED ORDER — AMLODIPINE BESYLATE 5 MG PO TABS
5.0000 mg | ORAL_TABLET | Freq: Every day | ORAL | Status: DC
  Administered 2022-07-17 – 2022-07-18 (×2): 5 mg via ORAL
  Filled 2022-07-16 (×2): qty 1

## 2022-07-16 MED ORDER — OXYCODONE HCL 5 MG PO TABS
2.5000 mg | ORAL_TABLET | Freq: Once | ORAL | Status: AC
  Administered 2022-07-16: 2.5 mg via ORAL
  Filled 2022-07-16: qty 1

## 2022-07-16 MED ORDER — FUROSEMIDE 40 MG PO TABS
40.0000 mg | ORAL_TABLET | Freq: Every day | ORAL | Status: DC
  Administered 2022-07-17 – 2022-07-18 (×2): 40 mg via ORAL
  Filled 2022-07-16 (×2): qty 1

## 2022-07-16 MED ORDER — ACETAMINOPHEN 325 MG PO TABS: 650.0000 mg | ORAL_TABLET | Freq: Four times a day (QID) | ORAL | Status: DC | PRN

## 2022-07-16 MED ORDER — ACETAMINOPHEN 650 MG RE SUPP: 650.0000 mg | Freq: Four times a day (QID) | RECTAL | Status: DC | PRN

## 2022-07-16 MED ORDER — PANTOPRAZOLE SODIUM 20 MG PO TBEC
20.0000 mg | DELAYED_RELEASE_TABLET | Freq: Every day | ORAL | Status: DC
  Administered 2022-07-17 – 2022-07-18 (×2): 20 mg via ORAL
  Filled 2022-07-16 (×3): qty 1

## 2022-07-16 MED ORDER — LINACLOTIDE 145 MCG PO CAPS
145.0000 ug | ORAL_CAPSULE | Freq: Every day | ORAL | Status: DC
  Administered 2022-07-17 – 2022-07-18 (×2): 145 ug via ORAL
  Filled 2022-07-16 (×3): qty 1

## 2022-07-16 MED ORDER — ACETAMINOPHEN 500 MG PO TABS
1000.0000 mg | ORAL_TABLET | Freq: Three times a day (TID) | ORAL | Status: DC
  Administered 2022-07-16 – 2022-07-19 (×7): 1000 mg via ORAL
  Filled 2022-07-16 (×7): qty 2

## 2022-07-16 MED ORDER — SERTRALINE HCL 50 MG PO TABS
25.0000 mg | ORAL_TABLET | Freq: Every day | ORAL | Status: DC
  Administered 2022-07-17 – 2022-07-18 (×2): 25 mg via ORAL
  Filled 2022-07-16 (×2): qty 1

## 2022-07-16 MED ORDER — LEVOTHYROXINE SODIUM 50 MCG PO TABS
75.0000 ug | ORAL_TABLET | Freq: Every day | ORAL | Status: DC
  Administered 2022-07-17 – 2022-07-19 (×3): 75 ug via ORAL
  Filled 2022-07-16 (×3): qty 1

## 2022-07-16 MED ORDER — HYDROCODONE-ACETAMINOPHEN 5-325 MG PO TABS: 1.0000 | ORAL_TABLET | Freq: Four times a day (QID) | ORAL | Status: DC | PRN

## 2022-07-16 MED ORDER — SODIUM CHLORIDE 0.9% FLUSH
3.0000 mL | Freq: Two times a day (BID) | INTRAVENOUS | Status: DC
  Administered 2022-07-16 – 2022-07-18 (×5): 3 mL via INTRAVENOUS

## 2022-07-16 MED ORDER — ONDANSETRON HCL 4 MG/2ML IJ SOLN
4.0000 mg | Freq: Once | INTRAMUSCULAR | Status: AC
  Administered 2022-07-16: 4 mg via INTRAVENOUS
  Filled 2022-07-16: qty 2

## 2022-07-16 MED ORDER — ACETAMINOPHEN 500 MG PO TABS
1000.0000 mg | ORAL_TABLET | Freq: Once | ORAL | Status: AC
  Administered 2022-07-16: 1000 mg via ORAL
  Filled 2022-07-16: qty 2

## 2022-07-16 MED ORDER — FERROUS SULFATE 325 (65 FE) MG PO TABS
325.0000 mg | ORAL_TABLET | Freq: Every day | ORAL | Status: DC
  Administered 2022-07-16 – 2022-07-18 (×3): 325 mg via ORAL
  Filled 2022-07-16 (×3): qty 1

## 2022-07-16 MED ORDER — ADULT MULTIVITAMIN W/MINERALS CH
1.0000 | ORAL_TABLET | Freq: Every day | ORAL | Status: DC
  Administered 2022-07-17 – 2022-07-18 (×2): 1 via ORAL
  Filled 2022-07-16 (×2): qty 1

## 2022-07-16 MED ORDER — HYDROCORTISONE ACETATE 25 MG RE SUPP
25.0000 mg | Freq: Two times a day (BID) | RECTAL | Status: DC
  Administered 2022-07-17 – 2022-07-18 (×4): 25 mg via RECTAL
  Filled 2022-07-16 (×6): qty 1

## 2022-07-16 MED ORDER — HYDROCODONE-ACETAMINOPHEN 5-325 MG PO TABS
1.0000 | ORAL_TABLET | Freq: Four times a day (QID) | ORAL | Status: DC | PRN
  Administered 2022-07-16 – 2022-07-18 (×2): 1 via ORAL
  Filled 2022-07-16 (×2): qty 1

## 2022-07-16 NOTE — H&P (Signed)
History and Physical    Patient: Mckenzie Gutierrez MPN:[id number] DOB: October 04, 1921 DOA: 07/16/2022 DOS: the patient was seen and examined on 07/16/2022 PCP: Jon Billings, NP  Patient coming from: Home  Chief Complaint:  Chief Complaint  Patient presents with   Back Pain   HPI: Mckenzie Gutierrez is a 88 y.o. female with medical history significant of paroxysmal atrial fibrillation on Eliquis, CVA, hypertension, hyperlipidemia, PVD, hypothyroidism, who presents to the ED with back pain.  Mckenzie Gutierrez states that earlier this week, either the evening of 11/7 or 11/8, she got up in the middle of the night to use the restroom.  She cannot recall hitting her head or falling.  She does recall getting up and going to her recliner and going back to bed.  Since her fall, she has not been experiencing any headaches, dizziness, focal weakness.  However, her son at bedside states she seemed more uneasy when ambulating with her walker.  Mckenzie Gutierrez states that the most bothersome aspect of her fall, is that she has been experiencing lower back pain that has been persistent.  The back pain does not radiate.  She denies any numbness, tingling, urinary or bowel incontinence.  Per chart review, patient visited her PCP on 11/7.  At that time, she endorsed urinary burning, frequency with urgency that had begun on 11/06.  Her PCP started her on Bactrim.  Urine culture at that time grew Klebsiella pneumoniae.  At this time, Mckenzie Gutierrez denies any further urinary symptoms.  ED course: On arrival to the ED, patient was hypertensive at 147/66 with heart rate of 94.  She was saturating at 99% on room air.  Initial work-up demonstrating hemoglobin of 9.2.  No electrolyte or renal dysfunction.  CT head was obtained that demonstrated acute subdural hemorrhage extending over left cerebral hemisphere with mass effect and midline shift to the right measuring 6 mm.  CT C-spine with no abnormalities noted.  CT L-spine with  compression deformity of the L3 vertebral body with approximately 60% height loss.  Neurosurgery consulted and will evaluate.  TRH contacted for admission.  Review of Systems: As mentioned in the history of present illness. All other systems reviewed and are negative. Past Medical History:  Diagnosis Date   Anxiety    Atrial premature beats    Bradycardia    Carotid stenosis    Depression    GERD (gastroesophageal reflux disease)    Hyperlipidemia    Hypertension    Low back pain    Osteoporosis    Thyroid disease    Urinary frequency    Past Surgical History:  Procedure Laterality Date   andarteretomy     CARPAL TUNNEL RELEASE     FLEXIBLE SIGMOIDOSCOPY N/A 05/13/2020   Procedure: FLEXIBLE SIGMOIDOSCOPY;  Surgeon: [name], MD;  Location: [facility] ENDOSCOPY;  Service: Gastroenterology;  Laterality: N/A;  Unsedated   WRIST SURGERY     Social History:  reports that she has never smoked. She has never used smokeless tobacco. She reports that she does not drink alcohol and does not use drugs.  Allergies  Allergen Reactions   Celebrex [Celecoxib] Other (See Comments)    Hard stools   Codeine Sulfate Other (See Comments)   Hydrochlorothiazide    Hytrin [Terazosin]    Plendil [Felodipine] Diarrhea and Nausea Only   Nitrofuran Derivatives Swelling and Rash   Nitrofurantoin Rash and Swelling    Family History  Problem Relation Age of Onset   Heart disease Mother  Stroke Father    Diabetes Father    Stroke Sister     Prior to Admission medications   Medication Sig Start Date End Date Taking? Authorizing Provider  acetaminophen (TYLENOL) 500 MG tablet Take 1,000 mg by mouth 2 (two) times daily.    [provider]  albuterol (VENTOLIN HFA) 108 (90 Base) MCG/ACT inhaler INHALE 2 PUFFS INTO THE LUNGS EVERY SIX HOURS AS NEEDED FOR WHEEZING OR SHORTNESS OF BREATH 05/30/21   Jon Billings, NP  amLODipine (NORVASC) 5 MG tablet TAKE 1 TABLET BY MOUTH DAILY **NEED  APPOINTMENT BEFORE ADDITIONAL REFILLS** 03/29/22   Jon Billings, NP  apixaban (ELIQUIS) 2.5 MG TABS tablet Take 2.5 mg by mouth 2 (two) times daily. 07/20/16   [provider]  beta carotene w/minerals (OCUVITE) tablet Take 1 tablet by mouth daily.    [provider]  cefpodoxime (VANTIN) 100 MG tablet Take 1 tablet (100 mg total) by mouth 2 (two) times daily for 5 days. 07/11/22 07/16/22  Jon Billings, NP  erythromycin ophthalmic ointment  11/29/21   [provider]  ferrous sulfate 325 (65 FE) MG tablet Take 325 mg by mouth at bedtime.    [provider]  furosemide (LASIX) 40 MG tablet Take 1 tablet by mouth daily. 02/16/22   [provider]  hydrocortisone (ANUSOL-HC) 25 MG suppository Place 1 suppository (25 mg total) rectally 2 (two) times daily. 10/07/21   Jon Billings, NP  levothyroxine (SYNTHROID) 75 MCG tablet TAKE 1 TABLET EVERY DAY ON EMPTY STOMACHWITH A GLASS OF WATER AT LEAST 30-60 MINBEFORE BREAKFAST 02/16/22   Jon Billings, NP  LINZESS 145 MCG CAPS capsule TAKE ONE CAPSULE EACH MORNING BEFORE BREAKFAST 06/06/22   [name], MD  losartan (COZAAR) 100 MG tablet TAKE ONE TABLET EVERY DAY 05/24/22   Jon Billings, NP  Multiple Vitamin (MULTIVITAMIN) tablet Take 1 tablet by mouth daily.    [provider]  pantoprazole (PROTONIX) 20 MG tablet TAKE 1 TABLET BY MOUTH DAILY 06/06/22   Jon Billings, NP  polyethylene glycol (MIRALAX / GLYCOLAX) 17 g packet Take 17 g by mouth daily.    [provider]  sertraline (ZOLOFT) 25 MG tablet TAKE 1 TABLET BY MOUTH DAILY 06/28/22   Jon Billings, NP    Physical Exam: Vitals:   07/16/22 1451 07/16/22 1808 07/16/22 1900 07/16/22 2000  BP:   (!) 131/47 (!) 116/49  Pulse:   84 73  Resp:   16 14  Temp:  98.8 F (37.1 C) 98.8 F (37.1 C)   TempSrc:  Oral Oral   SpO2:   98% 98%  Weight: 84.3 kg     Height: '5\' 7"'$  (1.702 m)      Physical Exam Vitals and  nursing note reviewed.  Constitutional:      General: She is not in acute distress.    Appearance: She is normal weight. She is not toxic-appearing.  HENT:     Head: Normocephalic and atraumatic. No raccoon eyes, Battle's sign, contusion or laceration.     Ears:     Comments: Very hard of hearing    Mouth/Throat:     Mouth: Mucous membranes are moist.     Pharynx: Oropharynx is clear.  Eyes:     Extraocular Movements: Extraocular movements intact.     Conjunctiva/sclera: Conjunctivae normal.  Cardiovascular:     Rate and Rhythm: Normal rate and regular rhythm.     Heart sounds: No murmur heard.    No gallop.  Pulmonary:  Effort: Pulmonary effort is normal. No respiratory distress.     Breath sounds: Normal breath sounds. No wheezing, rhonchi or rales.  Abdominal:     General: Bowel sounds are normal. There is no distension.     Palpations: Abdomen is soft.     Tenderness: There is no abdominal tenderness. There is no guarding.  Musculoskeletal:     Cervical back: Neck supple.     Right lower leg: No edema.     Left lower leg: No edema.  Skin:    General: Skin is warm and dry.  Neurological:     Mental Status: She is alert and oriented to person, place, and time.     Comments: Patient is alert and oriented x4.  5 out of 5 strength of bilateral upper and lower extremities.  Sensation intact.  No tremor.  Psychiatric:        Mood and Affect: Mood normal.        Behavior: Behavior normal.        Thought Content: Thought content normal.        Judgment: Judgment normal.    Data Reviewed: CBC with hemoglobin of 9.2, platelets of 187 and WBC of 10.3.  BMP with glucose of 143 and calcium of 8.6.  No other electrolyte or renal dysfunction noted.  PT/INR with INR elevated 1.5.  Urinalysis with trace leukocytes but no WBC, or bacteria.  EKG personally reviewed.  Consistent with atrial fibrillation with a rate of 71.  No ST or T wave changes to suggest acute ischemia.  QTc within  normal limits.  CT Lumbar Spine Wo Contrast  Result Date: 07/16/2022 CLINICAL DATA:  Back trauma.  Post fall on Monday night. EXAM: CT LUMBAR SPINE WITHOUT CONTRAST TECHNIQUE: Multidetector CT imaging of the lumbar spine was performed without intravenous contrast administration. Multiplanar CT image reconstructions were also generated. RADIATION DOSE REDUCTION: This exam was performed according to the departmental dose-optimization program which includes automated exposure control, adjustment of the mA and/or kV according to patient size and/or use of iterative reconstruction technique. COMPARISON:  Radiographs dated February 16, 2016. FINDINGS: Segmentation: 5 lumbar type vertebrae. Alignment: Mild levoscoliosis centered at L3 vertebral body. Straightening of the lumbar spine. Vertebrae: Compression deformity of L3 vertebral body with approximately 60% central vertebral body height loss. No evidence of retropulsion into the spinal canal. Paraspinal and other soft tissues: Rim calcified gallstone. Advanced atherosclerotic disease of abdominal aorta which is normal in caliber. No acute abnormality. No paraspinal soft tissue hematoma or fluid collection. Disc levels: Advanced multilevel degenerative disease with disc height loss and vacuum disc phenomena prominent at L3-L4 L4-L5 and L5-S1. T12-L1: Significant disc bulge, spinal canal or neural foraminal stenosis. L1-L2: Disc osteophyte complex with mild spinal canal stenosis. No significant neural foraminal stenosis. L2-L3: Disc osteophyte complex with disc bulge and spinal canal stenosis. Mild bilateral neural foraminal stenosis. L3-L4: Broad-based disc bulge with narrowing of lateral recesses bilaterally. Mild-to-moderate right neural foraminal stenosis. Moderate facet joint arthropathy. L4-L5: Disc height loss with disc osteophyte complex. Facet joint arthropathy. Moderate spinal canal stenosis. Moderate bilateral neural foraminal stenosis. L5-S1: Broad-based disc  bulge and facet joint arthropathy with moderate lateral recess stenosis. Moderate bilateral neural foraminal stenosis. IMPRESSION: 1. Compression deformity of L3 vertebral body with approximately 60% central vertebral body height loss. No evidence of retropulsion into the spinal canal. This is new since prior radiograph, and may represent acute or chronic process. Correlate with localized pain. 2. Advanced multilevel degenerative disease with disc  height loss and vacuum disc phenomena prominent at L3-L4, L4-L5 and L5-S1. 3. Multilevel disc osteophyte complexes with spinal canal stenosis and neural foraminal stenosis as described above. 4. Cholelithiasis. 5. Aortic atherosclerosis. Electronically Signed   By: Keane Police D.O.   On: 07/16/2022 16:27   CT HEAD WO CONTRAST (5MM)  Result Date: 07/16/2022 CLINICAL DATA:  Fall 6 to 7 days ago. EXAM: CT HEAD WITHOUT CONTRAST CT CERVICAL SPINE WITHOUT CONTRAST TECHNIQUE: Multidetector CT imaging of the head and cervical spine was performed following the standard protocol without intravenous contrast. Multiplanar CT image reconstructions of the cervical spine were also generated. RADIATION DOSE REDUCTION: This exam was performed according to the departmental dose-optimization program which includes automated exposure control, adjustment of the mA and/or kV according to patient size and/or use of iterative reconstruction technique. COMPARISON:  05/02/2021. FINDINGS: CT HEAD FINDINGS Brain: There is acute/recent hyperattenuating intracranial hemorrhage. Specifically, hyperattenuating subdural hemorrhage extends over the left cerebral hemisphere, most prominent over the left frontal lobe where it measures 12 mm in thickness. Subdural hemorrhage also extends along the interhemispheric fissure, measuring up to 1.5 cm in thickness. Additional subdural hemorrhage extends along the right and left aspects of the tentorium. Suspect a small amount of medial left frontal lobe  subarachnoid hemorrhage incontinuity with subdural hemorrhage. No parenchymal hemorrhage. Subdural hemorrhage causes mass effect, midline shift to the right of 6 mm, with partial effacement right-sided sulci and gyral flattening. No parenchymal or extra-axial masses. No evidence of an ischemic infarct. No hydrocephalus. Vascular: No hyperdense vessel or unexpected calcification. Skull: Normal. Negative for fracture or focal lesion. Sinuses/Orbits: Globes and orbits are unremarkable. Visualized sinuses are essentially clear. Other: None. CT CERVICAL SPINE FINDINGS Alignment: Normal. Skull base and vertebrae: No acute fracture. No primary bone lesion or focal pathologic process. Soft tissues and spinal canal: No prevertebral fluid or swelling. No visible canal hematoma. Disc levels: Moderate to marked loss of disc height at C5-C6 and C6-C7 with endplate spurring and disc bulging. No convincing disc herniation. Mild facet degenerative changes most evident on the left at C2-C3. Upper chest: No acute or significant findings. Other: None. IMPRESSION: HEAD CT 1. Acute/recent subdural hemorrhage extending over the left cerebral hemisphere, along the interhemispheric fissure and across the tentorium, with mass effect and midline shift to the right. Critical Value/emergent results were called by telephone at the time of interpretation on 07/16/2022 at 4:21 pm to provider Duffy Bruce , who verbally acknowledged these results. 2. No hydrocephalus.  No acute infarct. CERVICAL CT 1. No fracture or acute finding. Electronically Signed   By: Lajean Manes M.D.   On: 07/16/2022 16:22   CT Cervical Spine Wo Contrast  Result Date: 07/16/2022 CLINICAL DATA:  Fall 6 to 7 days ago. EXAM: CT HEAD WITHOUT CONTRAST CT CERVICAL SPINE WITHOUT CONTRAST TECHNIQUE: Multidetector CT imaging of the head and cervical spine was performed following the standard protocol without intravenous contrast. Multiplanar CT image reconstructions of the  cervical spine were also generated. RADIATION DOSE REDUCTION: This exam was performed according to the departmental dose-optimization program which includes automated exposure control, adjustment of the mA and/or kV according to patient size and/or use of iterative reconstruction technique. COMPARISON:  05/02/2021. FINDINGS: CT HEAD FINDINGS Brain: There is acute/recent hyperattenuating intracranial hemorrhage. Specifically, hyperattenuating subdural hemorrhage extends over the left cerebral hemisphere, most prominent over the left frontal lobe where it measures 12 mm in thickness. Subdural hemorrhage also extends along the interhemispheric fissure, measuring up to 1.5 cm in thickness.  Additional subdural hemorrhage extends along the right and left aspects of the tentorium. Suspect a small amount of medial left frontal lobe subarachnoid hemorrhage incontinuity with subdural hemorrhage. No parenchymal hemorrhage. Subdural hemorrhage causes mass effect, midline shift to the right of 6 mm, with partial effacement right-sided sulci and gyral flattening. No parenchymal or extra-axial masses. No evidence of an ischemic infarct. No hydrocephalus. Vascular: No hyperdense vessel or unexpected calcification. Skull: Normal. Negative for fracture or focal lesion. Sinuses/Orbits: Globes and orbits are unremarkable. Visualized sinuses are essentially clear. Other: None. CT CERVICAL SPINE FINDINGS Alignment: Normal. Skull base and vertebrae: No acute fracture. No primary bone lesion or focal pathologic process. Soft tissues and spinal canal: No prevertebral fluid or swelling. No visible canal hematoma. Disc levels: Moderate to marked loss of disc height at C5-C6 and C6-C7 with endplate spurring and disc bulging. No convincing disc herniation. Mild facet degenerative changes most evident on the left at C2-C3. Upper chest: No acute or significant findings. Other: None. IMPRESSION: HEAD CT 1. Acute/recent subdural hemorrhage  extending over the left cerebral hemisphere, along the interhemispheric fissure and across the tentorium, with mass effect and midline shift to the right. Critical Value/emergent results were called by telephone at the time of interpretation on 07/16/2022 at 4:21 pm to provider Duffy Bruce , who verbally acknowledged these results. 2. No hydrocephalus.  No acute infarct. CERVICAL CT 1. No fracture or acute finding. Electronically Signed   By: Lajean Manes M.D.   On: 07/16/2022 16:22    There are no new results to review at this time.  Assessment and Plan:  * Subdural hemorrhage [facility]) Patient presenting with a subdural hemorrhage over the left cerebral hemisphere in the setting of a ground-level fall.  CT results significant for mass effect with a 6 mm midline shift.  Neurosurgery has been consulted with recommendations to repeat CT in 6 hours.  No indication at this time for Eliquis reversal.  Patient is asymptomatic with no focal findings on examination.  - Neurosurgery following; appreciate their recommendations - Telemetry monitoring - CT head scheduled - Holding home Eliquis  Closed compression fracture of L3 vertebra ([place]) Secondary to ground-level fall.  Neurosurgery has evaluated patient and will consider for kyphoplasty.  - Neurosurgery following; appreciate their recommendations - Pain control with lidocaine patch, scheduled Tylenol and as needed Norco - Given history of osteoporosis, will need to consider initiating treatment.  Follow-up with PCP regarding this.  Normocytic anemia Patient has a past medical history of iron deficiency anemia currently on ferrous sulfate daily.  Hemoglobin was 12.4 approximately 2 weeks ago, now 9.2.  I suspect her subdural hemorrhage is contributing.   - Trend CBC while admitted - Iron panel and ferritin pending - Holding home Eliquis - Transfuse for hemoglobin less than 7  Ground-level fall At this time, it is unknown if ground-level fall  was mechanical or due to syncope.  Patient denies any dizziness, chest pain, palpitations or shortness of breath throughout the duration of this week.  She does ambulate with a walker, but her son states she frequently will not use it.  - Telemetry monitoring - Echocardiogram ordered - PT/OT  Atrial fibrillation (HCC) Currently rate controlled.  -Holding home Eliquis in the setting of SDH  Hypertension - Restart home antihypertensives  Advance Care Planning:   Code Status: DNR.  Discussed with Mrs. Deol and her son.  Mrs. Tyler Aas states that at her age, she would not want heroic efforts to be performed, including resuscitation.  Her son states that these are her wishes that she has previously relayed to her family.  Consults: Neurosurgery  Family Communication: Patient's son updated at bedside  Severity of Illness: The appropriate patient status for this patient is INPATIENT. Inpatient status is judged to be reasonable and necessary in order to provide the required intensity of service to ensure the patient's safety. The patient's presenting symptoms, physical exam findings, and initial radiographic and laboratory data in the context of their chronic comorbidities is felt to place them at high risk for further clinical deterioration. Furthermore, it is not anticipated that the patient will be medically stable for discharge from the hospital within 2 midnights of admission.   * I certify that at the point of admission it is my clinical judgment that the patient will require inpatient hospital care spanning beyond 2 midnights from the point of admission due to high intensity of service, high risk for further deterioration and high frequency of surveillance required.*  Author: Jose Persia, MD 07/16/2022 8:33 PM  For on call review [email or web address].

## 2022-07-16 NOTE — Assessment & Plan Note (Addendum)
Secondary to ground-level fall.  Neurosurgery has evaluated patient and will consider for kyphoplasty.  - Neurosurgery following; appreciate their recommendations - Pain control with lidocaine patch, scheduled Tylenol and as needed Norco - Given history of osteoporosis, will need to consider initiating treatment.  Follow-up with PCP regarding this.

## 2022-07-16 NOTE — ED Notes (Signed)
This RN to bedside to reposition pt. And clean pt's IV site which had dried blood around bandage. Pt. Denies further need. Family at bedside. NAD.

## 2022-07-16 NOTE — ED Notes (Signed)
Surgeon at bedside.  

## 2022-07-16 NOTE — Assessment & Plan Note (Signed)
At this time, it is unknown if ground-level fall was mechanical or due to syncope.  Patient denies any dizziness, chest pain, palpitations or shortness of breath throughout the duration of this week.  She does ambulate with a walker, but her son states she frequently will not use it.  - Telemetry monitoring - Echocardiogram ordered - PT/OT

## 2022-07-16 NOTE — Assessment & Plan Note (Signed)
Patient has a past medical history of iron deficiency anemia currently on ferrous sulfate daily.  Hemoglobin was 12.4 approximately 2 weeks ago, now 9.2.  I suspect her subdural hemorrhage is contributing.   - Trend CBC while admitted - Iron panel and ferritin pending - Holding home Eliquis - Transfuse for hemoglobin less than 7

## 2022-07-16 NOTE — Assessment & Plan Note (Signed)
Currently rate controlled.  -Holding home Eliquis in the setting of SDH

## 2022-07-16 NOTE — Consult Note (Signed)
Consulting Department:  See department  Primary Physician:  Jon Billings, NP  Chief Complaint: Subdural hematoma/compression fracture  History of Present Illness: 07/16/2022 Mckenzie Gutierrez is a 88 y.o. female who presents with the chief complaint of subdural hematoma and a compression fracture.  She has a history of A-fib and has been on Eliquis.  According to her family who is at bedside she had a fall on approximately Tuesday or Wednesday.  She had traveled in between.  Because of her back pain the family brought her into the emergency department for evaluation.  She had a full work-up which demonstrated both a compression fracture in the spine as well as a holohemispheric subdural hematoma.  She is not having any headaches.  Not having any weakness.  No numbness or tingling.  Has not had any confusion other than not remembering her fall clearly.  Has not had any new neurologic deficits.  No evidence of seizures.  She is not having any evidence of cauda equina syndrome.  No evidence of weakness or numbness or tingling.  States that she is felt diffusely weak for many years and does not feel that anything is out of the ordinary at this time.  She overall did not want to be brought into the hospital.  She does state that she would not want any intervention for her brain even if this meant that it could result in her death.  Her family was in agreement with that decision.  Review of Systems:  A 10 point review of systems is negative, except for the pertinent positives and negatives detailed in the HPI.  Past Medical History: Past Medical History:  Diagnosis Date   Anxiety    Atrial premature beats    Bradycardia    Carotid stenosis    Depression    GERD (gastroesophageal reflux disease)    Hyperlipidemia    Hypertension    Low back pain    Osteoporosis    Thyroid disease    Urinary frequency     Past Surgical History: Past Surgical History:  Procedure Laterality Date    andarteretomy     CARPAL TUNNEL RELEASE     FLEXIBLE SIGMOIDOSCOPY N/A 05/13/2020   Procedure: FLEXIBLE SIGMOIDOSCOPY;  Surgeon: [name], MD;  Location: [facility] ENDOSCOPY;  Service: Gastroenterology;  Laterality: N/A;  Unsedated   WRIST SURGERY      Allergies: Allergies as of 07/16/2022 - Review Complete 07/16/2022  Allergen Reaction Noted   Celebrex [celecoxib] Other (See Comments) 01/11/2016   Codeine sulfate Other (See Comments) 07/12/2015   Hydrochlorothiazide  07/12/2015   Hytrin [terazosin]  07/12/2015   Plendil [felodipine] Diarrhea and Nausea Only 07/12/2015   Nitrofuran derivatives Swelling and Rash 07/12/2015   Nitrofurantoin Rash and Swelling 07/12/2015    Medications: No current facility-administered medications for this encounter.  Current Outpatient Medications:    acetaminophen (TYLENOL) 500 MG tablet, Take 1,000 mg by mouth 2 (two) times daily., Disp: , Rfl:    albuterol (VENTOLIN HFA) 108 (90 Base) MCG/ACT inhaler, INHALE 2 PUFFS INTO THE LUNGS EVERY SIX HOURS AS NEEDED FOR WHEEZING OR SHORTNESS OF BREATH, Disp: 8.5 g, Rfl: 1   amLODipine (NORVASC) 5 MG tablet, TAKE 1 TABLET BY MOUTH DAILY **NEED APPOINTMENT BEFORE ADDITIONAL REFILLS**, Disp: 90 tablet, Rfl: 0   apixaban (ELIQUIS) 2.5 MG TABS tablet, Take 2.5 mg by mouth 2 (two) times daily., Disp: , Rfl:    beta carotene w/minerals (OCUVITE) tablet, Take 1 tablet by mouth daily., Disp: , Rfl:  cefpodoxime (VANTIN) 100 MG tablet, Take 1 tablet (100 mg total) by mouth 2 (two) times daily for 5 days., Disp: 10 tablet, Rfl: 0   erythromycin ophthalmic ointment, , Disp: , Rfl:    ferrous sulfate 325 (65 FE) MG tablet, Take 325 mg by mouth at bedtime., Disp: , Rfl:    furosemide (LASIX) 40 MG tablet, Take 1 tablet by mouth daily., Disp: , Rfl:    hydrocortisone (ANUSOL-HC) 25 MG suppository, Place 1 suppository (25 mg total) rectally 2 (two) times daily., Disp: 12 suppository, Rfl: 1   levothyroxine (SYNTHROID) 75 MCG  tablet, TAKE 1 TABLET EVERY DAY ON EMPTY STOMACHWITH A GLASS OF WATER AT LEAST 30-60 MINBEFORE BREAKFAST, Disp: 90 tablet, Rfl: 1   LINZESS 145 MCG CAPS capsule, TAKE ONE CAPSULE EACH MORNING BEFORE BREAKFAST, Disp: 90 capsule, Rfl: 1   losartan (COZAAR) 100 MG tablet, TAKE ONE TABLET EVERY DAY, Disp: 90 tablet, Rfl: 1   Multiple Vitamin (MULTIVITAMIN) tablet, Take 1 tablet by mouth daily., Disp: , Rfl:    pantoprazole (PROTONIX) 20 MG tablet, TAKE 1 TABLET BY MOUTH DAILY, Disp: 90 tablet, Rfl: 0   polyethylene glycol (MIRALAX / GLYCOLAX) 17 g packet, Take 17 g by mouth daily., Disp: , Rfl:    sertraline (ZOLOFT) 25 MG tablet, TAKE 1 TABLET BY MOUTH DAILY, Disp: 90 tablet, Rfl: 1   Social History: Social History   Tobacco Use   Smoking status: Never   Smokeless tobacco: Never  Vaping Use   Vaping Use: Never used  Substance Use Topics   Alcohol use: No    Alcohol/week: 0.0 standard drinks of alcohol   Drug use: No    Family Medical History: Family History  Problem Relation Age of Onset   Heart disease Mother    Stroke Father    Diabetes Father    Stroke Sister     Physical Examination: Vitals:   07/16/22 1344 07/16/22 1808  BP: (!) 147/66   Pulse: 94   Resp: 18   Temp: 98.9 F (37.2 C) 98.8 F (37.1 C)  SpO2: 99%      General: Patient is well developed, well nourished, calm, collected, and in no apparent distress.  NEUROLOGICAL:  General: In no acute distress.   Awake, alert, oriented to person, place, and time.  Pupils equal round and reactive to light, she does have a history of cataract surgery, so pupils have less expansion than average..  Full Facial tone is symmetric.  Tongue protrusion is midline.  Bilateral upper extremities are full strength proximally and distally.  There is no pronator drift.  Language is conversant.  GCS: 15   Bilateral upper and lower extremity sensation is intact to light touch.  Imaging: Narrative & Impression  CLINICAL DATA:   Fall 6 to 7 days ago.   EXAM: CT HEAD WITHOUT CONTRAST   CT CERVICAL SPINE WITHOUT CONTRAST   TECHNIQUE: Multidetector CT imaging of the head and cervical spine was performed following the standard protocol without intravenous contrast. Multiplanar CT image reconstructions of the cervical spine were also generated.   RADIATION DOSE REDUCTION: This exam was performed according to the departmental dose-optimization program which includes automated exposure control, adjustment of the mA and/or kV according to patient size and/or use of iterative reconstruction technique.   COMPARISON:  05/02/2021.   FINDINGS: CT HEAD FINDINGS   Brain: There is acute/recent hyperattenuating intracranial hemorrhage. Specifically, hyperattenuating subdural hemorrhage extends over the left cerebral hemisphere, most prominent over the left frontal lobe  where it measures 12 mm in thickness. Subdural hemorrhage also extends along the interhemispheric fissure, measuring up to 1.5 cm in thickness. Additional subdural hemorrhage extends along the right and left aspects of the tentorium. Suspect a small amount of medial left frontal lobe subarachnoid hemorrhage incontinuity with subdural hemorrhage. No parenchymal hemorrhage.   Subdural hemorrhage causes mass effect, midline shift to the right of 6 mm, with partial effacement right-sided sulci and gyral flattening.   No parenchymal or extra-axial masses. No evidence of an ischemic infarct. No hydrocephalus.   Vascular: No hyperdense vessel or unexpected calcification.   Skull: Normal. Negative for fracture or focal lesion.   Sinuses/Orbits: Globes and orbits are unremarkable. Visualized sinuses are essentially clear.   Other: None.   CT CERVICAL SPINE FINDINGS   Alignment: Normal.   Skull base and vertebrae: No acute fracture. No primary bone lesion or focal pathologic process.   Soft tissues and spinal canal: No prevertebral fluid or  swelling. No visible canal hematoma.   Disc levels: Moderate to marked loss of disc height at C5-C6 and C6-C7 with endplate spurring and disc bulging. No convincing disc herniation. Mild facet degenerative changes most evident on the left at C2-C3.   Upper chest: No acute or significant findings.   Other: None.   IMPRESSION: HEAD CT   1. Acute/recent subdural hemorrhage extending over the left cerebral hemisphere, along the interhemispheric fissure and across the tentorium, with mass effect and midline shift to the right. Critical Value/emergent results were called by telephone at the time of interpretation on 07/16/2022 at 4:21 pm to provider Duffy Bruce , who verbally acknowledged these results. 2. No hydrocephalus.  No acute infarct.   CERVICAL CT   1. No fracture or acute finding.     Electronically Signed   By: Lajean Manes M.D.   On: 07/16/2022 16:22     Narrative & Impression  CLINICAL DATA:  Back trauma.  Post fall on Monday night.   EXAM: CT LUMBAR SPINE WITHOUT CONTRAST   TECHNIQUE: Multidetector CT imaging of the lumbar spine was performed without intravenous contrast administration. Multiplanar CT image reconstructions were also generated.   RADIATION DOSE REDUCTION: This exam was performed according to the departmental dose-optimization program which includes automated exposure control, adjustment of the mA and/or kV according to patient size and/or use of iterative reconstruction technique.   COMPARISON:  Radiographs dated February 16, 2016.   FINDINGS: Segmentation: 5 lumbar type vertebrae.   Alignment: Mild levoscoliosis centered at L3 vertebral body. Straightening of the lumbar spine.   Vertebrae: Compression deformity of L3 vertebral body with approximately 60% central vertebral body height loss. No evidence of retropulsion into the spinal canal.   Paraspinal and other soft tissues: Rim calcified gallstone. Advanced atherosclerotic  disease of abdominal aorta which is normal in caliber. No acute abnormality. No paraspinal soft tissue hematoma or fluid collection.   Disc levels: Advanced multilevel degenerative disease with disc height loss and vacuum disc phenomena prominent at L3-L4 L4-L5 and L5-S1.   T12-L1: Significant disc bulge, spinal canal or neural foraminal stenosis.   L1-L2: Disc osteophyte complex with mild spinal canal stenosis. No significant neural foraminal stenosis.   L2-L3: Disc osteophyte complex with disc bulge and spinal canal stenosis. Mild bilateral neural foraminal stenosis.   L3-L4: Broad-based disc bulge with narrowing of lateral recesses bilaterally. Mild-to-moderate right neural foraminal stenosis. Moderate facet joint arthropathy.   L4-L5: Disc height loss with disc osteophyte complex. Facet joint arthropathy. Moderate spinal canal stenosis.  Moderate bilateral neural foraminal stenosis.   L5-S1: Broad-based disc bulge and facet joint arthropathy with moderate lateral recess stenosis. Moderate bilateral neural foraminal stenosis.   IMPRESSION: 1. Compression deformity of L3 vertebral body with approximately 60% central vertebral body height loss. No evidence of retropulsion into the spinal canal. This is new since prior radiograph, and may represent acute or chronic process. Correlate with localized pain. 2. Advanced multilevel degenerative disease with disc height loss and vacuum disc phenomena prominent at L3-L4, L4-L5 and L5-S1. 3. Multilevel disc osteophyte complexes with spinal canal stenosis and neural foraminal stenosis as described above. 4. Cholelithiasis. 5. Aortic atherosclerosis.     Electronically Signed   By: Keane Police D.O.   On: 07/16/2022 16:27       I have personally reviewed the images and agree with the above interpretation.  Labs:    Latest Ref Rng & Units 07/16/2022    3:23 PM 06/28/2022   11:07 AM 04/25/2022   11:44 AM  CBC  WBC 4.0 -  10.5 K/uL 10.3  8.2  6.9   Hemoglobin 12.0 - 15.0 g/dL 9.2  12.4  11.5   Hematocrit 36.0 - 46.0 % 27.5  37.7  35.6   Platelets 150 - 400 K/uL 187  169  194        Assessment and Plan: Mckenzie Gutierrez is a pleasant 88 y.o. female with history of A-fib on Eliquis.  Had a fall 6 to 7 days ago.  Was traveling in between.  Not having any symptoms.  She was having continued back pain so was brought into the emergency department for evaluation.  Given her history of a fall she had a extensive work-up.  Was found to have a large subdural hematoma as well as a L3 compression fracture.  She states that her most troublesome symptom right now is back pain.  She is not having any radiculopathy or evidence of cauda equina syndrome.  She may benefit from a brace for comfort.  We will discuss whether or not she would qualify for kyphoplasty with team tomorrow.  In regards to her subdural hematoma, this likely started approximately 6 to 7 days ago with her fall.  She has not had any new neurologic symptoms.  GCS 15 with no pronator drift.  She is not currently having any headaches.  She also states that she would not want anything done even if this were to put her life at risk.  Her family was in agreement.  We are currently holding her Eliquis which I am in agreement with, given the remoteness of her fall we can get a repeat head CT in 6 hours.  Should there have any expansion then would consider reversal per protocol.  However this is likely been stable for the past 6 days.  At this point I would not recommend any antiseizure prophylaxis given her age and comorbidity with seizure medications.  We will follow-up on her repeat head CT.   Stevan Born, MD/MSCR Dept. of Neurosurgery

## 2022-07-16 NOTE — ED Triage Notes (Signed)
Pt in via EMS from home with c/o lower back pain, post fall on Monday night/Tuesday morning. #20 to right AC, pt was given '4mg'$  of zofran and 52mg of fentanyl. Pt also with some NV

## 2022-07-16 NOTE — ED Provider Notes (Signed)
[facility] Provider Note    Event Date/Time   First MD Initiated Contact with Patient 07/16/22 1432     (approximate)   History   Back Pain   HPI  Mckenzie KEEVEN is a 88 y.o. female here with severe back pain.  Patient reportedly fell 2 to 3 days ago.  She states that she fell and does not recall what happened.  She was able to get herself back up.  Since then, she has had similar back pain which was initially only mild to moderate but now has become severe and is limiting her ability to even get out of bed.  She has not been able to ambulate today due to the pain.  She otherwise states she has had some decreased appetite but otherwise has been acting herself according to family.  There has been no confusion.  She is on Eliquis for A-fib.  She does not believe she hit her head.     Physical Exam   Triage Vital Signs: ED Triage Vitals  Enc Vitals Group     BP 07/16/22 1344 (!) 147/66     Pulse Rate 07/16/22 1344 94     Resp 07/16/22 1344 18     Temp 07/16/22 1344 98.9 F (37.2 C)     Temp Source 07/16/22 1344 Oral     SpO2 07/16/22 1344 99 %     Weight 07/16/22 1345 186 lb (84.4 kg)     Height 07/16/22 1451 '5\' 7"'$  (1.702 m)     Head Circumference --      Peak Flow --      Pain Score 07/16/22 1345 8     Pain Loc --      Pain Edu? --      Excl. in [place]? --     Most recent vital signs: Vitals:   07/16/22 1808 07/16/22 1900  BP:  (!) 131/47  Pulse:  84  Resp:  16  Temp: 98.8 F (37.1 C) 98.8 F (37.1 C)  SpO2:  98%     General: Awake, no distress.  CV:  Good peripheral perfusion.  Regular rate and rhythm. Resp:  Normal effort.  Lungs clear to auscultation bilaterally. Abd:  No distention.  No tenderness. Other:  Superficial, old appearing ecchymoses to the right temple area.  Cranial nerves II through XII intact.  Strength out of 5 bilateral upper and lower extremities.  Significant tenderness noted to palpation along the midline lower  lumbar spine.  No deformity.   ED Results / Procedures / Treatments   Labs (all labs ordered are listed, but only abnormal results are displayed) Labs Reviewed  CBC WITH DIFFERENTIAL/PLATELET - Abnormal; Notable for the following components:      Result Value   RBC 2.77 (*)    Hemoglobin 9.2 (*)    HCT 27.5 (*)    RDW 17.6 (*)    nRBC 0.8 (*)    Neutro Abs 8.8 (*)    Lymphs Abs 0.6 (*)    Abs Immature Granulocytes 0.09 (*)    All other components within normal limits  BASIC METABOLIC PANEL - Abnormal; Notable for the following components:   Glucose, Bld 143 (*)    Calcium 8.6 (*)    All other components within normal limits  URINALYSIS, ROUTINE W REFLEX MICROSCOPIC - Abnormal; Notable for the following components:   Color, Urine YELLOW (*)    APPearance CLEAR (*)    Leukocytes,Ua TRACE (*)  All other components within normal limits  PROTIME-INR - Abnormal; Notable for the following components:   Prothrombin Time 18.1 (*)    INR 1.5 (*)    All other components within normal limits  SARS CORONAVIRUS 2 BY RT PCR  BASIC METABOLIC PANEL  CBC WITH DIFFERENTIAL/PLATELET  APTT  PROTIME-INR  TYPE AND SCREEN     EKG Atrial fibrillation, ventricular rate 71.  QRS 90, QTc 3 2.  No acute ST elevations or depressions.  Rate is controlled.   RADIOLOGY CT lumbar spine: Compression deformity L3 with 60% height loss no bony retropulsion CT head: Acute subdural over the left cerebral hemisphere with mass affect and midline shift to the right CT C Spine: No acute fx  I also independently reviewed and agree with radiologist interpretations.   PROCEDURES:  Critical Care performed: No   MEDICATIONS ORDERED IN ED: Medications  sodium chloride flush (NS) 0.9 % injection 3 mL (has no administration in time range)  acetaminophen (TYLENOL) tablet 650 mg (has no administration in time range)    Or  acetaminophen (TYLENOL) suppository 650 mg (has no administration in time range)   polyethylene glycol (MIRALAX / GLYCOLAX) packet 17 g (has no administration in time range)  multivitamin with minerals tablet 1 tablet (has no administration in time range)  HYDROcodone-acetaminophen (NORCO/VICODIN) 5-325 MG per tablet 1-2 tablet (has no administration in time range)  lidocaine (LIDODERM) 5 % 1 patch (has no administration in time range)  ondansetron (ZOFRAN) injection 4 mg (4 mg Intravenous Given 07/16/22 1524)  acetaminophen (TYLENOL) tablet 1,000 mg (1,000 mg Oral Given 07/16/22 1532)  oxyCODONE (Oxy IR/ROXICODONE) immediate release tablet 2.5 mg (2.5 mg Oral Given 07/16/22 1532)     IMPRESSION / MDM / ASSESSMENT AND PLAN / ED COURSE  I reviewed the triage vital signs and the nursing notes.                               This patient presents to the ED for concern of fall, back pain, this involves an extensive number of treatment options, and is a complaint that carries with it a high risk of complications and morbidity.  The differential diagnosis includes lumbar compression fx, radiculopathy, sprain/strain, RP hematoma, unlikely renal/intra-abd source; fall sounds mechanical in nature but does not remember - ddx includes syncope, arrhythmia, anemia, AKI with orthostasis   Co morbidities that complicate the patient evaluation  AFib on Eliquis GERD HTN HLD Osteoporosis   Additional history obtained:  Additional history obtained from Son, Daughter External records from outside source obtained and reviewed including FM notes, dx with possible UTI on 11/7   Lab Tests:  I Ordered, and personally interpreted labs.  The pertinent results include:   No leukocytosis, Hgb 9.2 BMP unremarkable, normal renal function   Imaging Studies ordered:  I ordered imaging studies including CT L Spine, Head/C-Spine  I independently visualized and interpreted imaging which showed: CT Head; left SDH with 6 mm midline shift to right CT L Spine: L3 compression fx with 60%  height loss I agree with the radiologist interpretation   Cardiac Monitoring: / EKG:  The patient was maintained on a cardiac monitor.  I personally viewed and interpreted the cardiac monitored which showed an underlying rhythm of: Atrial fibrillation   Consultations Obtained:  I requested consultation with the Neurosurgeon,  and discussed lab and imaging findings as well as pertinent plan - they recommend: hospitalist admission, hold  anticoagulation and will see pt in ED   Problem List / ED Course / Critical interventions / Medication management  Back pain Imaging shows acute L3 compression fx which correlates with area of pain No signs of radiculopathy or cord compression Pain control, NSGY consult PT/OT and may need placement APAP/Oxycodone givn - Subdural hematoma: NSGY consult, hold anticoagulation, BP management I ordered medication including tylenol, oxycodone  for pain  Reevaluation of the patient after these medicines showed that the patient improved I have reviewed the patients home medicines and have made adjustments as needed   Social Determinants of Health:  Lives alone, likely will need SNF after admission   Test / Admission - Considered:  Admit to medicine   FINAL CLINICAL IMPRESSION(S) / ED DIAGNOSES   Final diagnoses:  Closed compression fracture of L3 lumbar vertebra, initial encounter ([place])  Subdural hematoma ([place])  Anticoagulated     Rx / DC Orders   ED Discharge Orders     None        Note:  This document was prepared using Dragon voice recognition software and may include unintentional dictation errors.   Duffy Bruce, MD 07/16/22 [phone]

## 2022-07-16 NOTE — Assessment & Plan Note (Signed)
Patient presenting with a subdural hemorrhage over the left cerebral hemisphere in the setting of a ground-level fall.  CT results significant for mass effect with a 6 mm midline shift.  Neurosurgery has been consulted with recommendations to repeat CT in 6 hours.  No indication at this time for Eliquis reversal.  Patient is asymptomatic with no focal findings on examination.  - Neurosurgery following; appreciate their recommendations - Telemetry monitoring - CT head scheduled - Holding home Eliquis

## 2022-07-16 NOTE — Assessment & Plan Note (Signed)
-   Restart home antihypertensives

## 2022-07-17 ENCOUNTER — Encounter: Payer: Self-pay | Admitting: Nurse Practitioner

## 2022-07-17 ENCOUNTER — Inpatient Hospital Stay: Payer: PPO

## 2022-07-17 ENCOUNTER — Encounter: Payer: Self-pay | Admitting: Internal Medicine

## 2022-07-17 DIAGNOSIS — S065XAA Traumatic subdural hemorrhage with loss of consciousness status unknown, initial encounter: Secondary | ICD-10-CM

## 2022-07-17 DIAGNOSIS — W1830XA Fall on same level, unspecified, initial encounter: Secondary | ICD-10-CM | POA: Diagnosis not present

## 2022-07-17 DIAGNOSIS — R569 Unspecified convulsions: Secondary | ICD-10-CM | POA: Diagnosis not present

## 2022-07-17 DIAGNOSIS — I6200 Nontraumatic subdural hemorrhage, unspecified: Secondary | ICD-10-CM | POA: Diagnosis not present

## 2022-07-17 DIAGNOSIS — S32030A Wedge compression fracture of third lumbar vertebra, initial encounter for closed fracture: Secondary | ICD-10-CM | POA: Diagnosis not present

## 2022-07-17 LAB — CBC WITH DIFFERENTIAL/PLATELET
Abs Immature Granulocytes: 0.08 10*3/uL — ABNORMAL HIGH (ref 0.00–0.07)
Basophils Absolute: 0.1 10*3/uL (ref 0.0–0.1)
Basophils Relative: 1 %
Eosinophils Absolute: 0.1 10*3/uL (ref 0.0–0.5)
Eosinophils Relative: 1 %
HCT: 26.6 % — ABNORMAL LOW (ref 36.0–46.0)
Hemoglobin: 8.8 g/dL — ABNORMAL LOW (ref 12.0–15.0)
Immature Granulocytes: 1 %
Lymphocytes Relative: 20 %
Lymphs Abs: 1.8 10*3/uL (ref 0.7–4.0)
MCH: 32.8 pg (ref 26.0–34.0)
MCHC: 33.1 g/dL (ref 30.0–36.0)
MCV: 99.3 fL (ref 80.0–100.0)
Monocytes Absolute: 0.8 10*3/uL (ref 0.1–1.0)
Monocytes Relative: 9 %
Neutro Abs: 6.1 10*3/uL (ref 1.7–7.7)
Neutrophils Relative %: 68 %
Platelets: 188 10*3/uL (ref 150–400)
RBC: 2.68 MIL/uL — ABNORMAL LOW (ref 3.87–5.11)
RDW: 17.8 % — ABNORMAL HIGH (ref 11.5–15.5)
WBC: 9.1 10*3/uL (ref 4.0–10.5)
nRBC: 0.9 % — ABNORMAL HIGH (ref 0.0–0.2)

## 2022-07-17 LAB — BASIC METABOLIC PANEL
Anion gap: 6 (ref 5–15)
BUN: 20 mg/dL (ref 8–23)
CO2: 24 mmol/L (ref 22–32)
Calcium: 8.6 mg/dL — ABNORMAL LOW (ref 8.9–10.3)
Chloride: 109 mmol/L (ref 98–111)
Creatinine, Ser: 0.83 mg/dL (ref 0.44–1.00)
GFR, Estimated: >60 mL/min (ref >60)
Glucose, Bld: 108 mg/dL — ABNORMAL HIGH (ref 70–99)
Potassium: 4 mmol/L (ref 3.5–5.1)
Sodium: 139 mmol/L (ref 135–145)

## 2022-07-17 LAB — IRON AND TIBC
Iron: 31 ug/dL (ref 28–170)
Saturation Ratios: 15 % (ref 10.4–31.8)
TIBC: 213 ug/dL — ABNORMAL LOW (ref 250–450)
UIBC: 182 ug/dL

## 2022-07-17 LAB — FERRITIN: Ferritin: 144 ng/mL (ref 11–307)

## 2022-07-17 LAB — PROTIME-INR
INR: 1.3 — ABNORMAL HIGH (ref 0.8–1.2)
Prothrombin Time: 16.5 seconds — ABNORMAL HIGH (ref 11.4–15.2)

## 2022-07-17 LAB — APTT: aPTT: 41 seconds — ABNORMAL HIGH (ref 24–36)

## 2022-07-17 LAB — GLUCOSE, CAPILLARY: Glucose-Capillary: 101 mg/dL — ABNORMAL HIGH (ref 70–99)

## 2022-07-17 MED ORDER — LEVETIRACETAM IN NACL 1500 MG/100ML IV SOLN
1500.0000 mg | Freq: Once | INTRAVENOUS | Status: AC
  Administered 2022-07-17: 1500 mg via INTRAVENOUS
  Filled 2022-07-17: qty 100

## 2022-07-17 MED ORDER — LORAZEPAM 2 MG/ML IJ SOLN: 2.0000 mg | INTRAMUSCULAR | Status: DC | PRN

## 2022-07-17 MED ORDER — LEVETIRACETAM IN NACL 500 MG/100ML IV SOLN
500.0000 mg | Freq: Two times a day (BID) | INTRAVENOUS | Status: DC
  Administered 2022-07-18 – 2022-07-19 (×4): 500 mg via INTRAVENOUS
  Filled 2022-07-17 (×5): qty 100

## 2022-07-17 MED ORDER — LORAZEPAM 2 MG/ML IJ SOLN
2.0000 mg | INTRAMUSCULAR | Status: DC | PRN
  Administered 2022-07-17: 2 mg via INTRAVENOUS
  Filled 2022-07-17 (×2): qty 1

## 2022-07-17 MED ORDER — IOHEXOL 350 MG/ML SOLN
75.0000 mL | Freq: Once | INTRAVENOUS | Status: AC | PRN
  Administered 2022-07-17: 75 mL via INTRAVENOUS

## 2022-07-17 NOTE — Significant Event (Signed)
Rapid Response Event Note   Reason for Call :  Altered mental status with known subdural hematoma  Initial Focused Assessment:  Rapid response RN arrived in patient's room with patient lying in bed with eyes open, 2A staff at bedside, and patient's son at bedside. Per report from patient's son and 2A staff, patient was able to speak overnight and is currently mute (patient had bilateral hearing aids in and her glasses on). Her eyes are tracking and she can follow simple commands, though, while no drift, right grip was weaker than left. CBG 101. Vital signs showed temperature of 98.5 F, BP 147/71, HR 70s afib, RR 18 and unlabored, oxygen saturation 95% on room air. During neurological examination patient had seizure like activity lasting approximately 45 seconds to 1 min. Activity began with rhythmic jerking of right arm and then progressed to full body jerking and inability of patient to focus on staff. Oxygen saturations dropped to mid 80s during activity so 4L nasal cannula applied. Post seizure like activity, finished neurological exam showed notable deficit for sensory and extinction of bilateral upper extremities in addition to muteness. Initial NIH 7.   Interventions:  Spoke with Dr. Cheral Marker on the phone, who ordered CT head and CT angio in light of worsening neurologic status. Dr. Mal Misty arrived at bedside and he assessed patient and updated patient's son and daughter (who had arrived). Transported patient to CT for scans and back to patient's room without incident. Titrated off oxygen.  Plan of Care:  Patient to remain on 2A for now. Dr. Mal Misty ordered prn ativan for future seizure like activity and an EEG. Dr. Cheral Marker and Dr. Mal Misty to review orders and plan of care in light of scans.  Event Summary:   MD Notified: Dr. Mal Misty and Dr. Cheral Marker Call Time: 10:23 Arrival Time: 10:25 End Time: 11:26  Alejo Beamer, Jaynie Bream, RN

## 2022-07-17 NOTE — Progress Notes (Signed)
On arrival after RR page, RRT present in room. Pt awake, nonverbal, maintaining airway. Dr. Mal Misty contacted, stated was on the way to room. Saw him '@1040'$ 

## 2022-07-17 NOTE — Consult Note (Signed)
NEURO HOSPITALIST CONSULT NOTE   Requestig physician: Dr. Mal Misty  Reason for Consult: Acute onset of focal onset seizure progressing to GTC seizure activity with mutism and right hemibody deficits postictally.   History obtained from:  Family, RN and Chart     HPI:                                                                                                                                          Mckenzie Gutierrez is an 88 y.o. female with a PMHx of anxiety, atrial fibrillation on Eliquis, carotid stenosis, depression, HLD, HTN, osteoporosis, thyroid disease and urinary frequency who presented to the ED on Sunday with a c/c of LBP after a fall on Monday/Tuesday. She did not recall hitting her head, but CT revealed a large acute/recent subdural hemorrhage extending over the left cerebral hemisphere, along the interhemispheric fissure and across the tentorium, with mass effect and midline shift to the right.   This morning she was noted to be weak on the right, which was not documented on prior exams; soon afterwards, right sided seizure activity was noted, beginning in the hand and marching up the arm, then involving her entire body with total duration of 1 minute. Following the seizure, her neurological exam showed left sided weakness, global aphasia and  sensory extinction of her bilateral upper extremities, with initial NIHSS of 7.    Past Medical History:  Diagnosis Date   Anxiety    Atrial premature beats    Bradycardia    Carotid stenosis    Depression    GERD (gastroesophageal reflux disease)    Hyperlipidemia    Hypertension    Low back pain    Osteoporosis    Thyroid disease    Urinary frequency     Past Surgical History:  Procedure Laterality Date   andarteretomy     CARPAL TUNNEL RELEASE     FLEXIBLE SIGMOIDOSCOPY N/A 05/13/2020   Procedure: FLEXIBLE SIGMOIDOSCOPY;  Surgeon: [name], MD;  Location: [facility] ENDOSCOPY;  Service: Gastroenterology;   Laterality: N/A;  Unsedated   WRIST SURGERY      Family History  Problem Relation Age of Onset   Heart disease Mother    Stroke Father    Diabetes Father    Stroke Sister               Social History:  reports that she has never smoked. She has never used smokeless tobacco. She reports that she does not drink alcohol and does not use drugs.  Allergies  Allergen Reactions   Celebrex [Celecoxib] Other (See Comments)    Hard stools   Codeine Sulfate Other (See Comments)   Hydrochlorothiazide    Hytrin [Terazosin]    Plendil [Felodipine] Diarrhea and Nausea Only  Nitrofuran Derivatives Swelling and Rash   Nitrofurantoin Rash and Swelling    MEDICATIONS:                                                                                                                     Scheduled:  acetaminophen  1,000 mg Oral Q8H   amLODipine  5 mg Oral Daily   ferrous sulfate  325 mg Oral QHS   furosemide  40 mg Oral Daily   hydrocortisone  25 mg Rectal BID   levothyroxine  75 mcg Oral Q0600   lidocaine  1 patch Transdermal Q24H   linaclotide  145 mcg Oral QAC breakfast   losartan  100 mg Oral Daily   multivitamin with minerals  1 tablet Oral Daily   pantoprazole  20 mg Oral Daily   polyethylene glycol  17 g Oral Daily   sertraline  25 mg Oral Daily   sodium chloride flush  3 mL Intravenous Q12H   Continuous:  levETIRAcetam     [START ON 07/18/2022] levETIRAcetam       ROS:                                                                                                                                       Unable to obtain due to mutism.    Blood pressure (!) 180/68, pulse 67, temperature 98.5 F (36.9 C), resp. rate 18, height '5\' 7"'$  (1.702 m), weight 84.3 kg, SpO2 99 %.   General Examination:                                                                                                       Physical Exam  HEENT-  [place]/AT. Neck is supple.   Lungs- Respirations  unlabored Extremities- No edema  Neurological Examination Mental Status: Awake and alert-appearing with no verbal output. Not following commands. Will briefly visually fixate on examiner's face. No definite neglect.  Cranial Nerves: II: Will fixate on examiner's face briefly. PERRL. Blinks to  threat in both visual fields.  III,IV, VI: No ptosis. Eyes are conjugate at the midline.  V,VII: Face is symmetric at rest.  VIII: Unable to assess IX,X: Gag reflex deferred XI: Head is midline XII: Not following commands for testing.  Motor/Sensory: Right hand and wrist with rhythmic jerking/twitching movements about 1-2 per second which began a few minutes after examiner entered the room. No purposeful RUE movement.  LUE: Uses LUE to grab RUE when RUE jerking worsens. Unable to formally assess LUE strength BLE withdraw equally to noxious plantar stimulation.  Deep Tendon Reflexes: 2+ and symmetric bilateral brachioradialis and patellae Plantars: Equivocal bilaterally  Cerebellar: Unable to assess as not following commands.  Gait: Deferred Other: Seizure activity noted about 2 minutes after examiner entered room, beginning with right hand/forearm rhythmic jerking/twitching movements about 1-2 per second that progressed to elbow jerking and then right lower face twitching. This then spread to involve her LUE, but with lower amplitude than on the right, with eye closed, sonorous respirations and head rotated to the right. Total duration of seizure activity approximately 2 minutes, after which patient opened her eyes and appeared to be short of breath.    Lab Results: Basic Metabolic Panel: Recent Labs  Lab 07/16/22 1523 07/17/22 0547  NA 136 139  K 4.7 4.0  CL 107 109  CO2 24 24  GLUCOSE 143* 108*  BUN 19 20  CREATININE 0.77 0.83  CALCIUM 8.6* 8.6*    CBC: Recent Labs  Lab 07/16/22 1523 07/17/22 0547  WBC 10.3 9.1  NEUTROABS 8.8* 6.1  HGB 9.2* 8.8*  HCT 27.5* 26.6*  MCV 99.3 99.3   PLT 187 188    Cardiac Enzymes: No results for input(s): "CKTOTAL", "CKMB", "CKMBINDEX", "TROPONINI" in the last 168 hours.  Lipid Panel: No results for input(s): "CHOL", "TRIG", "HDL", "CHOLHDL", "VLDL", "LDLCALC" in the last 168 hours.  Imaging: CT ANGIO HEAD W OR WO CONTRAST  Result Date: 07/17/2022 CLINICAL DATA:  88 year old female with left subdural hematoma after a fall. EXAM: CT ANGIOGRAPHY HEAD TECHNIQUE: Multidetector CT imaging of the head was performed using the standard protocol during bolus administration of intravenous contrast. Multiplanar CT image reconstructions and MIPs were obtained to evaluate the vascular anatomy. RADIATION DOSE REDUCTION: This exam was performed according to the departmental dose-optimization program which includes automated exposure control, adjustment of the mA and/or kV according to patient size and/or use of iterative reconstruction technique. CONTRAST:  54m OMNIPAQUE IOHEXOL 350 MG/ML SOLN COMPARISON:  Head and cervical spine CT yesterday. FINDINGS: CT HEAD Brain: Calcified atherosclerosis at the skull base. Lobulated and hyperdense hemispheric left subdural hematoma appears stable from yesterday measuring 10-11 mm in thickness along the left cerebral convexity (coronal image 34) and up to 11 mm thickness along the inter hemispheric fissure also. Small volume bilateral tentorial subdural hyperdense blood is stable. Trace right middle cranial fossa subdural also possible. Mild rightward midline shift of 4 mm is stable. Trace intraventricular hemorrhage layering in the occipital horns has not significantly changed. No ventriculomegaly. Basilar cisterns remain patent. No new intracranial hemorrhage identified. Stable gray-white matter differentiation throughout the brain. No cortically based acute infarct identified. Calvarium and skull base: No skull fracture identified. Paranasal sinuses: Partial left mastoid opacification is stable. Tympanic cavities,  other paranasal sinuses and mastoids remain clear. Orbits: Stable orbits and scalp. CTA HEAD Posterior circulation: Codominant distal vertebral arteries with atherosclerosis, but only mild distal vertebral stenosis. Patent vertebrobasilar junction and basilar artery without stenosis. Patent SCA and PCA origins.  Tortuous basilar. Posterior communicating arteries are diminutive or absent. Bilateral PCA branches are patent, with mild P1 and P2 segment atherosclerosis and irregularity. Anterior circulation: Both ICA siphons are patent, although heavily calcified through the cavernous and proximal supraclinoid segments. Subsequent moderate to severe bilateral distal siphon stenosis. But the carotid termini remain patent. Patent MCA and ACA origins. Normal anterior communicating artery. Bilateral ACA branches are within normal limits. Left MCA origin is calcified with mild stenosis. Left M1 segment and bifurcation are patent without stenosis. Left MCA branches appear normal aside from mild subdural hematoma related mass effect. Right MCA M1 segment and bifurcation are patent without stenosis. Right MCA branches are within normal limits. Venous sinuses: Displaced intracranial venous structures but no contrast extravasation identified into the subdural hematoma collections. Superior sagittal sinus and other major dural venous sinuses are enhancing and appear to be patent. Anatomic variants: None significant. Review of the MIP images confirms the above findings IMPRESSION: 1. Stable bilateral subdural hematomas, trace intraventricular blood since yesterday. Stable rightward midline shift of 4 mm. Basilar cisterns remain patent. 2. No CTA contrast extravasation identified into the subdural collections. Mild hematoma related mass effect on the Left MCA branches. 3. Intracranial atherosclerosis. No significant posterior circulation stenosis. But heavily calcified ICA siphons with subsequent moderate to severe bilateral distal  ICA stenosis. But no large vessel occlusion. 4. No new intracranial abnormality identified. Electronically Signed   By: Genevie Ann M.D.   On: 07/17/2022 11:37   CT HEAD WO CONTRAST (5MM)  Result Date: 07/16/2022 CLINICAL DATA:  Subarachnoid hemorrhage, history of fall EXAM: CT HEAD WITHOUT CONTRAST TECHNIQUE: Contiguous axial images were obtained from the base of the skull through the vertex without intravenous contrast. RADIATION DOSE REDUCTION: This exam was performed according to the departmental dose-optimization program which includes automated exposure control, adjustment of the mA and/or kV according to patient size and/or use of iterative reconstruction technique. COMPARISON:  07/16/2022 at 3:50 p.m. FINDINGS: Brain: Subdural hematoma is again identified throughout the left cerebral hemisphere, and extending along the falx and tentorium. The subdural hematoma measures up to 0.9 cm in the left frontal region, previously 1.2 cm. Anterior falcine portion of the subdural hematoma measures up to 1.4 cm in thickness, previously 1.6 cm. There is persistent mass effect, with rightward midline shift measuring approximately 0.4 cm, previously measuring approximately 0.5 cm. No evidence of acute infarct. Lateral ventricles and midline structures are stable. Vascular: No hyperdense vessel or unexpected calcification. Skull: Normal. Negative for fracture or focal lesion. Sinuses/Orbits: No acute finding. Other: None. IMPRESSION: 1. Stable subdural hematoma along the left cerebral hemisphere, falx, and tentorium. Stable mass effect and rightward midline shift measuring 0.4 cm. 2. No acute infarct. Electronically Signed   By: Randa Ngo M.D.   On: 07/16/2022 21:49   CT Lumbar Spine Wo Contrast  Result Date: 07/16/2022 CLINICAL DATA:  Back trauma.  Post fall on Monday night. EXAM: CT LUMBAR SPINE WITHOUT CONTRAST TECHNIQUE: Multidetector CT imaging of the lumbar spine was performed without intravenous contrast  administration. Multiplanar CT image reconstructions were also generated. RADIATION DOSE REDUCTION: This exam was performed according to the departmental dose-optimization program which includes automated exposure control, adjustment of the mA and/or kV according to patient size and/or use of iterative reconstruction technique. COMPARISON:  Radiographs dated February 16, 2016. FINDINGS: Segmentation: 5 lumbar type vertebrae. Alignment: Mild levoscoliosis centered at L3 vertebral body. Straightening of the lumbar spine. Vertebrae: Compression deformity of L3 vertebral body with approximately 60% central vertebral  body height loss. No evidence of retropulsion into the spinal canal. Paraspinal and other soft tissues: Rim calcified gallstone. Advanced atherosclerotic disease of abdominal aorta which is normal in caliber. No acute abnormality. No paraspinal soft tissue hematoma or fluid collection. Disc levels: Advanced multilevel degenerative disease with disc height loss and vacuum disc phenomena prominent at L3-L4 L4-L5 and L5-S1. T12-L1: Significant disc bulge, spinal canal or neural foraminal stenosis. L1-L2: Disc osteophyte complex with mild spinal canal stenosis. No significant neural foraminal stenosis. L2-L3: Disc osteophyte complex with disc bulge and spinal canal stenosis. Mild bilateral neural foraminal stenosis. L3-L4: Broad-based disc bulge with narrowing of lateral recesses bilaterally. Mild-to-moderate right neural foraminal stenosis. Moderate facet joint arthropathy. L4-L5: Disc height loss with disc osteophyte complex. Facet joint arthropathy. Moderate spinal canal stenosis. Moderate bilateral neural foraminal stenosis. L5-S1: Broad-based disc bulge and facet joint arthropathy with moderate lateral recess stenosis. Moderate bilateral neural foraminal stenosis. IMPRESSION: 1. Compression deformity of L3 vertebral body with approximately 60% central vertebral body height loss. No evidence of retropulsion into  the spinal canal. This is new since prior radiograph, and may represent acute or chronic process. Correlate with localized pain. 2. Advanced multilevel degenerative disease with disc height loss and vacuum disc phenomena prominent at L3-L4, L4-L5 and L5-S1. 3. Multilevel disc osteophyte complexes with spinal canal stenosis and neural foraminal stenosis as described above. 4. Cholelithiasis. 5. Aortic atherosclerosis. Electronically Signed   By: Keane Police D.O.   On: 07/16/2022 16:27   CT HEAD WO CONTRAST (5MM)  Result Date: 07/16/2022 CLINICAL DATA:  Fall 6 to 7 days ago. EXAM: CT HEAD WITHOUT CONTRAST CT CERVICAL SPINE WITHOUT CONTRAST TECHNIQUE: Multidetector CT imaging of the head and cervical spine was performed following the standard protocol without intravenous contrast. Multiplanar CT image reconstructions of the cervical spine were also generated. RADIATION DOSE REDUCTION: This exam was performed according to the departmental dose-optimization program which includes automated exposure control, adjustment of the mA and/or kV according to patient size and/or use of iterative reconstruction technique. COMPARISON:  05/02/2021. FINDINGS: CT HEAD FINDINGS Brain: There is acute/recent hyperattenuating intracranial hemorrhage. Specifically, hyperattenuating subdural hemorrhage extends over the left cerebral hemisphere, most prominent over the left frontal lobe where it measures 12 mm in thickness. Subdural hemorrhage also extends along the interhemispheric fissure, measuring up to 1.5 cm in thickness. Additional subdural hemorrhage extends along the right and left aspects of the tentorium. Suspect a small amount of medial left frontal lobe subarachnoid hemorrhage incontinuity with subdural hemorrhage. No parenchymal hemorrhage. Subdural hemorrhage causes mass effect, midline shift to the right of 6 mm, with partial effacement right-sided sulci and gyral flattening. No parenchymal or extra-axial masses. No  evidence of an ischemic infarct. No hydrocephalus. Vascular: No hyperdense vessel or unexpected calcification. Skull: Normal. Negative for fracture or focal lesion. Sinuses/Orbits: Globes and orbits are unremarkable. Visualized sinuses are essentially clear. Other: None. CT CERVICAL SPINE FINDINGS Alignment: Normal. Skull base and vertebrae: No acute fracture. No primary bone lesion or focal pathologic process. Soft tissues and spinal canal: No prevertebral fluid or swelling. No visible canal hematoma. Disc levels: Moderate to marked loss of disc height at C5-C6 and C6-C7 with endplate spurring and disc bulging. No convincing disc herniation. Mild facet degenerative changes most evident on the left at C2-C3. Upper chest: No acute or significant findings. Other: None. IMPRESSION: HEAD CT 1. Acute/recent subdural hemorrhage extending over the left cerebral hemisphere, along the interhemispheric fissure and across the tentorium, with mass effect and midline shift  to the right. Critical Value/emergent results were called by telephone at the time of interpretation on 07/16/2022 at 4:21 pm to provider Duffy Bruce , who verbally acknowledged these results. 2. No hydrocephalus.  No acute infarct. CERVICAL CT 1. No fracture or acute finding. Electronically Signed   By: Lajean Manes M.D.   On: 07/16/2022 16:22   CT Cervical Spine Wo Contrast  Result Date: 07/16/2022 CLINICAL DATA:  Fall 6 to 7 days ago. EXAM: CT HEAD WITHOUT CONTRAST CT CERVICAL SPINE WITHOUT CONTRAST TECHNIQUE: Multidetector CT imaging of the head and cervical spine was performed following the standard protocol without intravenous contrast. Multiplanar CT image reconstructions of the cervical spine were also generated. RADIATION DOSE REDUCTION: This exam was performed according to the departmental dose-optimization program which includes automated exposure control, adjustment of the mA and/or kV according to patient size and/or use of iterative  reconstruction technique. COMPARISON:  05/02/2021. FINDINGS: CT HEAD FINDINGS Brain: There is acute/recent hyperattenuating intracranial hemorrhage. Specifically, hyperattenuating subdural hemorrhage extends over the left cerebral hemisphere, most prominent over the left frontal lobe where it measures 12 mm in thickness. Subdural hemorrhage also extends along the interhemispheric fissure, measuring up to 1.5 cm in thickness. Additional subdural hemorrhage extends along the right and left aspects of the tentorium. Suspect a small amount of medial left frontal lobe subarachnoid hemorrhage incontinuity with subdural hemorrhage. No parenchymal hemorrhage. Subdural hemorrhage causes mass effect, midline shift to the right of 6 mm, with partial effacement right-sided sulci and gyral flattening. No parenchymal or extra-axial masses. No evidence of an ischemic infarct. No hydrocephalus. Vascular: No hyperdense vessel or unexpected calcification. Skull: Normal. Negative for fracture or focal lesion. Sinuses/Orbits: Globes and orbits are unremarkable. Visualized sinuses are essentially clear. Other: None. CT CERVICAL SPINE FINDINGS Alignment: Normal. Skull base and vertebrae: No acute fracture. No primary bone lesion or focal pathologic process. Soft tissues and spinal canal: No prevertebral fluid or swelling. No visible canal hematoma. Disc levels: Moderate to marked loss of disc height at C5-C6 and C6-C7 with endplate spurring and disc bulging. No convincing disc herniation. Mild facet degenerative changes most evident on the left at C2-C3. Upper chest: No acute or significant findings. Other: None. IMPRESSION: HEAD CT 1. Acute/recent subdural hemorrhage extending over the left cerebral hemisphere, along the interhemispheric fissure and across the tentorium, with mass effect and midline shift to the right. Critical Value/emergent results were called by telephone at the time of interpretation on 07/16/2022 at 4:21 pm to  provider Duffy Bruce , who verbally acknowledged these results. 2. No hydrocephalus.  No acute infarct. CERVICAL CT 1. No fracture or acute finding. Electronically Signed   By: Lajean Manes M.D.   On: 07/16/2022 16:22     Assessment: 88 year old female who presented to the hospital on Sunday after a ground-level fall. CT head on the same day revealed an acute/recent subdural hemorrhage extending over the left cerebral hemisphere, along the interhemispheric fissure and across the tentorium, with mass effect and midline shift to the right. This morning she was noted to be weak on the right, which was not documented on prior exams; soon afterwards, right sided seizure activity was noted, beginning in the hand and marching up the arm, then involving her entire body with total duration of 1 minute. Following the seizure, her neurological exam showed left sided weakness, global aphasia and  sensory extinction of her bilateral upper extremities, with initial NIHSS of 7.   - Exam reveals recurrence of seizure activity,  initially on the right, then spreading to her LUE followed by spontaneous resolution. She is awake but mute with inability to follow any commands both prior to and after the seizure.  - Overall presentation is most consistent with new onset seizure secondary to her large acute left subdural hematoma. - STAT CT preliminarily reviewed. No definite interval change in the sizes of the large subdural hematomas involving the left cerebral convexity and left parafalcine region, with unchanged mass effect. No new brain parenchymal hypodensity is seen.  - Ativan 2 mg IV given at the bedside after resolution of the recurrent seizure activity seen during Neurology exam.   Recommendations: - Keppra 1500 mg IV load x 1 now (ordered STAT) - Start scheduled Keppra 500 mg IV BID 12 hours after loading dose (ordered) - STAT EEG (ordered) - Ativan 2 mg IV PRN seizure recurrence.  - Given new onset of  seizures, most likely related to her acute left subdural hematoma with mass effect, the patient may benefit from drainage via burr hole. Patient initially refused any major operations. Family with HCPOA and patient no longer able to make her own decisions. They stated that they would like to speak with Neurosurgery again regarding reconsideration for possible subdural hematoma drainage.   Addendum: EEG: Sharp wave, left frontal region. Continuous slow, generalized and lateralized left hemisphere. This study showed evidence of potential epileptogenicity arising from left frontal region. There is cortical dysfunction in left hemisphere likely secondary to underlying  SDH. Additionally there is moderate diffuse encephalopathy, nonspecific etiology. No seizures were seen throughout the recording.  Electronically signed: Dr. Kerney Elbe 07/17/2022, 12:03 PM

## 2022-07-17 NOTE — Progress Notes (Signed)
OT Cancellation Note  Patient Details Name: SHELBEY SPINDLER MRN: [id number] DOB: [birth year]   Cancelled Treatment:    Reason Eval/Treat Not Completed: Patient at procedure or test/ unavailable;Medical issues which prohibited therapy. Order received, chart reviewed. Per chart pt with RR this AM, currently off the unit for imaging. Will hold and initiate services as pt appropriate.   Dessie Coma, M.S. OTR/L  07/17/22, 11:23 AM  ascom [phone]

## 2022-07-17 NOTE — Procedures (Signed)
Patient Name: Mckenzie Gutierrez  MRN: [id number]  Epilepsy Attending: Lora Havens  Referring Physician/Provider: Kerney Elbe, MD  Date: 07/17/2022 Duration: 22.25 mins  Patient history: 88yo F ith sub dural hematoma. EEG to evaluate for seizure.  Level of alertness: Awake, asleep  AEDs during EEG study: Ativan  Technical aspects: This EEG study was done with scalp electrodes positioned according to the 10-20 International system of electrode placement. Electrical activity was reviewed with band pass filter of 1-'70Hz'$ , sensitivity of 7 uV/mm, display speed of 33m/sec with a '60Hz'$  notched filter applied as appropriate. EEG data were recorded continuously and digitally stored.  Video monitoring was available and reviewed as appropriate.  Description: No clear posterior dominant rhythm was seen. Sleep was characterized by vertex waves, sleep spindles (12 to 14 Hz), maximal frontocentral region.  EEG showed continuous 3-'6Hz'$  theta-delta slowing admixed with 2-'14hz'$  beta activity in right hemisphere. There is also continuous low amplitude 3-'5Hz'$  theta-delta slowing. Sharp waves were noted in left frontal region. Hyperventilation and photic stimulation were not performed.     ABNORMALITY - Sharp wave, left frontal region - Continuous slow, generalized and lateralized left hemisphere  IMPRESSION: This study showed evidence of potential epileptogenicity arising from left frontal region. There is cortical dysfunction in left hemisphere likely secondary to underlying  SDH. Additionally there is moderate diffuse encephalopathy, nonspecific etiology. No seizures were seen throughout the recording.  Jennefer Kopp OBarbra Sarks

## 2022-07-17 NOTE — Progress Notes (Signed)
PT Cancellation Note  Patient Details Name: Mckenzie Gutierrez MRN: [id number] DOB: 12-29-1921   Cancelled Treatment:    Reason Eval/Treat Not Completed: Medical issues which prohibited therapy (Consult received and chart reviewed.  Per notes, patient with RR this AM due to acute onset of seizure-like activity and overall change in neurological status.  Additional testing/imaging ordered/completed.  Will hold PT this date until work up complete and patient appropriate for mobility assessment.)  Kenyon Eichelberger H. Owens Shark, PT, DPT, NCS 07/17/22, 2:08 PM [phone]

## 2022-07-17 NOTE — Progress Notes (Signed)
Eeg done 

## 2022-07-17 NOTE — Progress Notes (Addendum)
Progress Note    Mckenzie Gutierrez  HGD:[id number] DOB: 11/17/1921  DOA: 07/16/2022 PCP: Jon Billings, NP      Brief Narrative:    Medical records reviewed and are as summarized below:  Mckenzie Gutierrez is a 88 y.o. female with medical history significant of paroxysmal atrial fibrillation on Eliquis, CVA, hypertension, hyperlipidemia, PVD, hypothyroidism , who presented to the hospital with severe back pain.  Reportedly, patient had a fall on the evening of either 07/11/2022 or 07/12/2022, when she got up in the middle of the night to use the bathroom.  She had been complaining of back pain since the fall and she also had difficulty walking with her walker.   Work-up in the ED revealed left subdural hematoma with mass effect and midline shift to the right, likely subacute, and L3 vertebral compression fracture.  He was treated with analgesics.  Patient had stated that she did not want any invasive management or surgical intervention even if it was required.  Repeat CT head showed stability in subdural hematoma.  Neurosurgeon recommended conservative management.    Assessment/Plan:   Principal Problem:   Subdural hemorrhage (HCC) Active Problems:   Closed compression fracture of L3 vertebra (HCC)   Ground-level fall   Normocytic anemia   Atrial fibrillation (HCC)   Hypertension    Body mass index is 29.11 kg/m.   Left subdural hematoma with mass effect and midline shift to the right: Repeat CT head showed stability in subdural hematoma.  Patient had stated that she did not want any invasive management or surgical intervention given the failure was required.  Neurosurgeon recommended conservative management.  Sudden change in mental status/witnessed seizure-like activity (right arm twitching and weakness): Repeat CT head did not show any change in subdural hematoma.  Stat EEG was ordered and showed evidence of potential epileptogenicity arising from the left frontal  region.  No seizures were seen during the recording..  Dr. Cheral Marker, neurologist, has been consulted.  She has been started on IV Keppra.  IV Ativan as needed for seizures.  Paroxysmal atrial fibrillation: Eliquis has been discontinued  L3 vertebral compression fracture, s/p fall: Analgesics as needed for pain.  Follow-up with neurosurgeon.  PT and OT evaluation   Other comorbidities include hypertension, PVD, carotid stenosis, hypothyroidism, chronic anemia, MGUS    ADDENDUM   Case discussed with Dr. Izora Ribas via, secure chat. Prognosis is poor and he recommended palliative care. I updated [name] (son) about Dr. Rhea Bleacher recommendation. He said he will discuss it with his sister before making a decision.     Diet Order             Diet regular Room service appropriate? Yes; Fluid consistency: Thin  Diet effective now                            Consultants: Neurosurgeon Neurologist  Procedures: None    Medications:    acetaminophen  1,000 mg Oral Q8H   amLODipine  5 mg Oral Daily   ferrous sulfate  325 mg Oral QHS   furosemide  40 mg Oral Daily   hydrocortisone  25 mg Rectal BID   levothyroxine  75 mcg Oral Q0600   lidocaine  1 patch Transdermal Q24H   linaclotide  145 mcg Oral QAC breakfast   losartan  100 mg Oral Daily   multivitamin with minerals  1 tablet Oral Daily   pantoprazole  20 mg Oral Daily   polyethylene glycol  17 g Oral Daily   sertraline  25 mg Oral Daily   sodium chloride flush  3 mL Intravenous Q12H   Continuous Infusions:   Anti-infectives (From admission, onward)    None              Family Communication/Anticipated D/C date and plan/Code Status   DVT prophylaxis: SCDs Start: 07/16/22 1847     Code Status: DNR  Family Communication: Plan of care was discussed with her son and daughter at the bedside Disposition Plan: Plan to discharge to SNF when medically stable   Status is:  Inpatient Remains inpatient appropriate because: Subdural hematoma       Subjective:   Interval events noted.  Patient was seen earlier this morning.  She did not answer any of my questions but she was just trying to turn in bed.  She looked uncomfortable doing this.  Later in the day, rapid response was called because her nurse had noticed that patient was nonverbal and had right arm twitching with weakness.  I responded to the rapid response by going back to the bedside.  Patient was alert but was nonverbal / not answering my questions or following commands.  Her son and daughter, who were at the bedside said that this was different from her baseline.    Objective:    Vitals:   07/16/22 2000 07/16/22 2153 07/17/22 0039 07/17/22 0502  BP: (!) 116/49 (!) 147/58 (!) 113/50 132/63  Pulse: 73 81 64 84  Resp: '14 18 18 18  '$ Temp:  98 F (36.7 C) 97.9 F (36.6 C) 98.5 F (36.9 C)  TempSrc:  Oral    SpO2: 98% 94% 90% 93%  Weight:      Height:       No data found.  No intake or output data in the 24 hours ending 07/17/22 0835 Filed Weights   07/16/22 1345 07/16/22 1451  Weight: 84.4 kg 84.3 kg    Exam:  GEN: NAD SKIN: Warm and dry EYES: EOMI, PERRLA, no pallor or icterus ENT: MMM CV: RRR PULM: CTA B ABD: soft, ND, NT, +BS CNS: Alert but not answering my questions or following commands, limited exam because patient does not follow commands.  No seizure activity witnessed during my exam EXT: No edema or tenderness MSK: Unable to examine her back       Data Reviewed:   I have personally reviewed following labs and imaging studies:  Labs: Labs show the following:   Basic Metabolic Panel: Recent Labs  Lab 07/16/22 1523 07/17/22 0547  NA 136 139  K 4.7 4.0  CL 107 109  CO2 24 24  GLUCOSE 143* 108*  BUN 19 20  CREATININE 0.77 0.83  CALCIUM 8.6* 8.6*   GFR Estimated Creatinine Clearance: 42.2 mL/min (by C-G formula based on SCr of 0.83 mg/dL). Liver  Function Tests: No results for input(s): "AST", "ALT", "ALKPHOS", "BILITOT", "PROT", "ALBUMIN" in the last 168 hours. No results for input(s): "LIPASE", "AMYLASE" in the last 168 hours. No results for input(s): "AMMONIA" in the last 168 hours. Coagulation profile Recent Labs  Lab 07/16/22 1804 07/17/22 0547  INR 1.5* 1.3*    CBC: Recent Labs  Lab 07/16/22 1523 07/17/22 0547  WBC 10.3 9.1  NEUTROABS 8.8* 6.1  HGB 9.2* 8.8*  HCT 27.5* 26.6*  MCV 99.3 99.3  PLT 187 188   Cardiac Enzymes: No results for input(s): "CKTOTAL", "CKMB", "CKMBINDEX", "TROPONINI" in the  last 168 hours. BNP (last 3 results) No results for input(s): "PROBNP" in the last 8760 hours. CBG: No results for input(s): "GLUCAP" in the last 168 hours. D-Dimer: No results for input(s): "DDIMER" in the last 72 hours. Hgb A1c: No results for input(s): "HGBA1C" in the last 72 hours. Lipid Profile: No results for input(s): "CHOL", "HDL", "LDLCALC", "TRIG", "CHOLHDL", "LDLDIRECT" in the last 72 hours. Thyroid function studies: No results for input(s): "TSH", "T4TOTAL", "T3FREE", "THYROIDAB" in the last 72 hours.  Invalid input(s): "FREET3" Anemia work up: Recent Labs    07/17/22 0547  FERRITIN 144  TIBC 213*  IRON 31   Sepsis Labs: Recent Labs  Lab 07/16/22 1523 07/17/22 0547  WBC 10.3 9.1    Microbiology Recent Results (from the past 240 hour(s))  Microscopic Examination     Status: None   Collection Time: 07/11/22  2:20 PM   Urine  Result Value Ref Range Status   WBC, UA >30W 0 - 5 /hpf Final   RBC, Urine 0-2 0 - 2 /hpf Final   Epithelial Cells (non renal) 0-10 0 - 10 /hpf Final   Bacteria, UA Few None seen/Few Final  Urine Culture     Status: Abnormal (Preliminary result)   Collection Time: 07/12/22  3:18 PM   Specimen: Urine   UR  Result Value Ref Range Status   Urine Culture, Routine Preliminary report (A)  Preliminary   Organism ID, Bacteria Gram negative rods (A)  Preliminary     Comment: Greater than 100,000 colony forming units per mL   ORGANISM ID, BACTERIA Comment  Preliminary    Comment: Microbiological testing to rule out the presence of possible pathogens is in progress.   SARS Coronavirus 2 by RT PCR (hospital order, performed in [facility] hospital lab) *cepheid single result test* Urine, Clean Catch     Status: None   Collection Time: 07/16/22  6:04 PM   Specimen: Urine, Clean Catch; Nasal Swab  Result Value Ref Range Status   SARS Coronavirus 2 by RT PCR NEGATIVE NEGATIVE Final    Comment: (NOTE) SARS-CoV-2 target nucleic acids are NOT DETECTED.  The SARS-CoV-2 RNA is generally detectable in upper and lower respiratory specimens during the acute phase of infection. The lowest concentration of SARS-CoV-2 viral copies this assay can detect is 250 copies / mL. A negative result does not preclude SARS-CoV-2 infection and should not be used as the sole basis for treatment or other patient management decisions.  A negative result may occur with improper specimen collection / handling, submission of specimen other than nasopharyngeal swab, presence of viral mutation(s) within the areas targeted by this assay, and inadequate number of viral copies (<250 copies / mL). A negative result must be combined with clinical observations, patient history, and epidemiological information.  Fact Sheet for Patients:   https://www.patel.info/  Fact Sheet for Healthcare Providers: https://hall.com/  This test is not yet approved or  cleared by the [place] FDA and has been authorized for detection and/or diagnosis of SARS-CoV-2 by FDA under an Emergency Use Authorization (EUA).  This EUA will remain in effect (meaning this test can be used) for the duration of the COVID-19 declaration under Section 564(b)(1) of the Act, 21 U.S.C. section 360bbb-3(b)(1), unless the authorization is terminated or revoked  sooner.  Performed at [facility], [address] [place],  [address]     Procedures and diagnostic studies:  CT HEAD WO CONTRAST (5MM)  Result Date: 07/16/2022 CLINICAL DATA:  Subarachnoid  hemorrhage, history of fall EXAM: CT HEAD WITHOUT CONTRAST TECHNIQUE: Contiguous axial images were obtained from the base of the skull through the vertex without intravenous contrast. RADIATION DOSE REDUCTION: This exam was performed according to the departmental dose-optimization program which includes automated exposure control, adjustment of the mA and/or kV according to patient size and/or use of iterative reconstruction technique. COMPARISON:  07/16/2022 at 3:50 p.m. FINDINGS: Brain: Subdural hematoma is again identified throughout the left cerebral hemisphere, and extending along the falx and tentorium. The subdural hematoma measures up to 0.9 cm in the left frontal region, previously 1.2 cm. Anterior falcine portion of the subdural hematoma measures up to 1.4 cm in thickness, previously 1.6 cm. There is persistent mass effect, with rightward midline shift measuring approximately 0.4 cm, previously measuring approximately 0.5 cm. No evidence of acute infarct. Lateral ventricles and midline structures are stable. Vascular: No hyperdense vessel or unexpected calcification. Skull: Normal. Negative for fracture or focal lesion. Sinuses/Orbits: No acute finding. Other: None. IMPRESSION: 1. Stable subdural hematoma along the left cerebral hemisphere, falx, and tentorium. Stable mass effect and rightward midline shift measuring 0.4 cm. 2. No acute infarct. Electronically Signed   By: Randa Ngo M.D.   On: 07/16/2022 21:49   CT Lumbar Spine Wo Contrast  Result Date: 07/16/2022 CLINICAL DATA:  Back trauma.  Post fall on Monday night. EXAM: CT LUMBAR SPINE WITHOUT CONTRAST TECHNIQUE: Multidetector CT imaging of the lumbar spine was performed without intravenous contrast administration.  Multiplanar CT image reconstructions were also generated. RADIATION DOSE REDUCTION: This exam was performed according to the departmental dose-optimization program which includes automated exposure control, adjustment of the mA and/or kV according to patient size and/or use of iterative reconstruction technique. COMPARISON:  Radiographs dated February 16, 2016. FINDINGS: Segmentation: 5 lumbar type vertebrae. Alignment: Mild levoscoliosis centered at L3 vertebral body. Straightening of the lumbar spine. Vertebrae: Compression deformity of L3 vertebral body with approximately 60% central vertebral body height loss. No evidence of retropulsion into the spinal canal. Paraspinal and other soft tissues: Rim calcified gallstone. Advanced atherosclerotic disease of abdominal aorta which is normal in caliber. No acute abnormality. No paraspinal soft tissue hematoma or fluid collection. Disc levels: Advanced multilevel degenerative disease with disc height loss and vacuum disc phenomena prominent at L3-L4 L4-L5 and L5-S1. T12-L1: Significant disc bulge, spinal canal or neural foraminal stenosis. L1-L2: Disc osteophyte complex with mild spinal canal stenosis. No significant neural foraminal stenosis. L2-L3: Disc osteophyte complex with disc bulge and spinal canal stenosis. Mild bilateral neural foraminal stenosis. L3-L4: Broad-based disc bulge with narrowing of lateral recesses bilaterally. Mild-to-moderate right neural foraminal stenosis. Moderate facet joint arthropathy. L4-L5: Disc height loss with disc osteophyte complex. Facet joint arthropathy. Moderate spinal canal stenosis. Moderate bilateral neural foraminal stenosis. L5-S1: Broad-based disc bulge and facet joint arthropathy with moderate lateral recess stenosis. Moderate bilateral neural foraminal stenosis. IMPRESSION: 1. Compression deformity of L3 vertebral body with approximately 60% central vertebral body height loss. No evidence of retropulsion into the spinal  canal. This is new since prior radiograph, and may represent acute or chronic process. Correlate with localized pain. 2. Advanced multilevel degenerative disease with disc height loss and vacuum disc phenomena prominent at L3-L4, L4-L5 and L5-S1. 3. Multilevel disc osteophyte complexes with spinal canal stenosis and neural foraminal stenosis as described above. 4. Cholelithiasis. 5. Aortic atherosclerosis. Electronically Signed   By: Keane Police D.O.   On: 07/16/2022 16:27   CT HEAD WO CONTRAST (5MM)  Result Date: 07/16/2022 CLINICAL DATA:  Fall 6 to 7 days ago. EXAM: CT HEAD WITHOUT CONTRAST CT CERVICAL SPINE WITHOUT CONTRAST TECHNIQUE: Multidetector CT imaging of the head and cervical spine was performed following the standard protocol without intravenous contrast. Multiplanar CT image reconstructions of the cervical spine were also generated. RADIATION DOSE REDUCTION: This exam was performed according to the departmental dose-optimization program which includes automated exposure control, adjustment of the mA and/or kV according to patient size and/or use of iterative reconstruction technique. COMPARISON:  05/02/2021. FINDINGS: CT HEAD FINDINGS Brain: There is acute/recent hyperattenuating intracranial hemorrhage. Specifically, hyperattenuating subdural hemorrhage extends over the left cerebral hemisphere, most prominent over the left frontal lobe where it measures 12 mm in thickness. Subdural hemorrhage also extends along the interhemispheric fissure, measuring up to 1.5 cm in thickness. Additional subdural hemorrhage extends along the right and left aspects of the tentorium. Suspect a small amount of medial left frontal lobe subarachnoid hemorrhage incontinuity with subdural hemorrhage. No parenchymal hemorrhage. Subdural hemorrhage causes mass effect, midline shift to the right of 6 mm, with partial effacement right-sided sulci and gyral flattening. No parenchymal or extra-axial masses. No evidence of an  ischemic infarct. No hydrocephalus. Vascular: No hyperdense vessel or unexpected calcification. Skull: Normal. Negative for fracture or focal lesion. Sinuses/Orbits: Globes and orbits are unremarkable. Visualized sinuses are essentially clear. Other: None. CT CERVICAL SPINE FINDINGS Alignment: Normal. Skull base and vertebrae: No acute fracture. No primary bone lesion or focal pathologic process. Soft tissues and spinal canal: No prevertebral fluid or swelling. No visible canal hematoma. Disc levels: Moderate to marked loss of disc height at C5-C6 and C6-C7 with endplate spurring and disc bulging. No convincing disc herniation. Mild facet degenerative changes most evident on the left at C2-C3. Upper chest: No acute or significant findings. Other: None. IMPRESSION: HEAD CT 1. Acute/recent subdural hemorrhage extending over the left cerebral hemisphere, along the interhemispheric fissure and across the tentorium, with mass effect and midline shift to the right. Critical Value/emergent results were called by telephone at the time of interpretation on 07/16/2022 at 4:21 pm to provider Duffy Bruce , who verbally acknowledged these results. 2. No hydrocephalus.  No acute infarct. CERVICAL CT 1. No fracture or acute finding. Electronically Signed   By: Lajean Manes M.D.   On: 07/16/2022 16:22   CT Cervical Spine Wo Contrast  Result Date: 07/16/2022 CLINICAL DATA:  Fall 6 to 7 days ago. EXAM: CT HEAD WITHOUT CONTRAST CT CERVICAL SPINE WITHOUT CONTRAST TECHNIQUE: Multidetector CT imaging of the head and cervical spine was performed following the standard protocol without intravenous contrast. Multiplanar CT image reconstructions of the cervical spine were also generated. RADIATION DOSE REDUCTION: This exam was performed according to the departmental dose-optimization program which includes automated exposure control, adjustment of the mA and/or kV according to patient size and/or use of iterative reconstruction  technique. COMPARISON:  05/02/2021. FINDINGS: CT HEAD FINDINGS Brain: There is acute/recent hyperattenuating intracranial hemorrhage. Specifically, hyperattenuating subdural hemorrhage extends over the left cerebral hemisphere, most prominent over the left frontal lobe where it measures 12 mm in thickness. Subdural hemorrhage also extends along the interhemispheric fissure, measuring up to 1.5 cm in thickness. Additional subdural hemorrhage extends along the right and left aspects of the tentorium. Suspect a small amount of medial left frontal lobe subarachnoid hemorrhage incontinuity with subdural hemorrhage. No parenchymal hemorrhage. Subdural hemorrhage causes mass effect, midline shift to the right of 6 mm, with partial effacement right-sided sulci and gyral flattening. No parenchymal or extra-axial masses. No evidence of an  ischemic infarct. No hydrocephalus. Vascular: No hyperdense vessel or unexpected calcification. Skull: Normal. Negative for fracture or focal lesion. Sinuses/Orbits: Globes and orbits are unremarkable. Visualized sinuses are essentially clear. Other: None. CT CERVICAL SPINE FINDINGS Alignment: Normal. Skull base and vertebrae: No acute fracture. No primary bone lesion or focal pathologic process. Soft tissues and spinal canal: No prevertebral fluid or swelling. No visible canal hematoma. Disc levels: Moderate to marked loss of disc height at C5-C6 and C6-C7 with endplate spurring and disc bulging. No convincing disc herniation. Mild facet degenerative changes most evident on the left at C2-C3. Upper chest: No acute or significant findings. Other: None. IMPRESSION: HEAD CT 1. Acute/recent subdural hemorrhage extending over the left cerebral hemisphere, along the interhemispheric fissure and across the tentorium, with mass effect and midline shift to the right. Critical Value/emergent results were called by telephone at the time of interpretation on 07/16/2022 at 4:21 pm to provider Duffy Bruce , who verbally acknowledged these results. 2. No hydrocephalus.  No acute infarct. CERVICAL CT 1. No fracture or acute finding. Electronically Signed   By: Lajean Manes M.D.   On: 07/16/2022 16:22               LOS: 1 day   Ayanah Snader  [facility]   Pager on [email or web address]. If 7PM-7AM, please contact night-coverage at [email or web address]     07/17/2022, 8:35 AM

## 2022-07-17 NOTE — Progress Notes (Signed)
   07/17/22 1100  Clinical Encounter Type  Visited With Patient  Visit Type Initial  Referral From Nurse  Consult/Referral To Chaplain   Chaplain responded to RRT. Chaplain provided a compassionate presence as care team responded. Patient brought to scans. Chaplain services are available for follow up as needed.

## 2022-07-17 NOTE — Progress Notes (Signed)
    Attending Progress Note  History: Mckenzie Gutierrez is here for left subdural hematoma.  She was seen by Dr. Tamala Gutierrez over the weekend, and was quite clear that she would not consider surgical intervention.  She had a significant decline in function today.  Longer verbal.  She is not moving her right side well any longer she was worked up for possible seizure activity, and started on seizure medication.  Physical Exam: Vitals:   07/17/22 1428 07/17/22 1648  BP: (!) 117/56 132/61  Pulse:  92  Resp: 16 18  Temp: 98.4 F (36.9 C) 98.4 F (36.9 C)  SpO2: 96% 93%   Opens eyes to stim.  Localizes.  Does not move her right side.  Reactive to light 3 to 2 mm.  She does not protrude her tongue.  She is nonverbal.  She weakly withdraws her right arm and leg.  She localizes with the left upper extremity.  Data:  Other tests/results: CT Head reviewed - significant L subdural hematoma as described by radiology  Assessment/Plan:  Mckenzie Gutierrez has had a significant setback with likely seizure activity after a subdural hematoma.  She has had a substantial neurologic decline.  At this point, I would recommend considering she does not show improvement   Mckenzie Maw MD, [facility] Department of Neurosurgery

## 2022-07-17 NOTE — Progress Notes (Signed)
Pt returned from the CT.

## 2022-07-17 NOTE — Progress Notes (Signed)
Noted no verbal response, pt a and follows commands. Hearing aids are in and working. Twitching of right arm, as well as weakness. No difficulty swallowing medications. MD and charge nurse notified. Rapid response called. Family at the bedside updated. As soon as RR at the bedside, the twitching of arm turned into jerking of whole arm, and spread to the shoulder and to the head. Moody at 4L applied. New orders are in. Pt transferred to stat CT

## 2022-07-18 DIAGNOSIS — S32030A Wedge compression fracture of third lumbar vertebra, initial encounter for closed fracture: Secondary | ICD-10-CM | POA: Diagnosis not present

## 2022-07-18 DIAGNOSIS — I6200 Nontraumatic subdural hemorrhage, unspecified: Secondary | ICD-10-CM | POA: Diagnosis not present

## 2022-07-18 DIAGNOSIS — R569 Unspecified convulsions: Secondary | ICD-10-CM | POA: Diagnosis not present

## 2022-07-18 LAB — URINE CULTURE

## 2022-07-18 NOTE — Progress Notes (Signed)
I spoke with the patient's son and daughter at length.  We discussed the course of her care and her currently diagnosis and outlook, which is dire.  I recommended consideration of palliative care/hospice, as there is no intervention currently being considered and she has not made substantial improvement.  I suspect she will deteriorate over the next few days to weeks.    I will communicate with Dr. Mal Misty regarding this plan.

## 2022-07-18 NOTE — Progress Notes (Addendum)
Progress Note    Mckenzie Gutierrez  GUY:[id number] DOB: 12/28/1921  DOA: 07/16/2022 PCP: Jon Billings, NP      Brief Narrative:    Medical records reviewed and are as summarized below:  Mckenzie Gutierrez is a 88 y.o. female with medical history significant of paroxysmal atrial fibrillation on Eliquis, CVA, hypertension, hyperlipidemia, PVD, hypothyroidism , who presented to the hospital with severe back pain.  Reportedly, patient had a fall on the evening of either 07/11/2022 or 07/12/2022, when she got up in the middle of the night to use the bathroom.  She had been complaining of back pain since the fall and she also had difficulty walking with her walker.   Work-up in the ED revealed left subdural hematoma with mass effect and midline shift to the right, likely subacute, and L3 vertebral compression fracture.  He was treated with analgesics.  Patient had stated that she did not want any invasive management or surgical intervention even if it was required.  Repeat CT head showed stability in subdural hematoma.  Neurosurgeon recommended conservative management.    Assessment/Plan:   Principal Problem:   Subdural hemorrhage (HCC) Active Problems:   Closed compression fracture of L3 vertebra (HCC)   Ground-level fall   Normocytic anemia   Atrial fibrillation (HCC)   Hypertension   Seizure (HCC)   Subdural hematoma (HCC)    Body mass index is 29.11 kg/m.   Left subdural hematoma with mass effect and midline shift to the right complicated by aphasia, right-sided weakness, seizures: Repeat CT head showed stability in subdural hematoma.  Patient had stated that she did not want any invasive management or surgical intervention even if surgery will be life-saving.  Case was discussed with Dr. Izora Ribas, neurosurgeon.  Prognosis is poor.  Discussed palliative care with her family but they are undecided.  Mckenzie Gutierrez requested to speak to the neurosurgeon Dr. Izora Ribas has been  notified via secure chat    Seizure: Continue IV Keppra.  IV Ativan as needed for seizures   Paroxysmal atrial fibrillation: Eliquis has been discontinued  L3 vertebral compression fracture, s/p fall: Analgesics as needed for pain.  Follow-up with neurosurgeon.  PT and OT evaluation   Other comorbidities include hypertension, PVD, carotid stenosis, hypothyroidism, chronic anemia, MGUS    ADDENDUM:   Plan of care was discussed with Mckenzie Gutierrez at 5:08 p.m. today.  She said she had spoken to Dr. Izora Ribas, neurosurgeon.  We discussed hospice.  She would like to talk to hospice about her options.  Hospice consult has been ordered.   Diet Order             Diet regular Room service appropriate? Yes; Fluid consistency: Thin  Diet effective now                            Consultants: Neurosurgeon Neurologist  Procedures: None    Medications:    acetaminophen  1,000 mg Oral Q8H   amLODipine  5 mg Oral Daily   ferrous sulfate  325 mg Oral QHS   furosemide  40 mg Oral Daily   hydrocortisone  25 mg Rectal BID   levothyroxine  75 mcg Oral Q0600   lidocaine  1 patch Transdermal Q24H   linaclotide  145 mcg Oral QAC breakfast   losartan  100 mg Oral Daily   multivitamin with minerals  1 tablet Oral Daily   pantoprazole  20 mg Oral  Daily   polyethylene glycol  17 g Oral Daily   sertraline  25 mg Oral Daily   sodium chloride flush  3 mL Intravenous Q12H   Continuous Infusions:  levETIRAcetam Stopped (07/18/22 0923)     Anti-infectives (From admission, onward)    None              Family Communication/Anticipated D/C date and plan/Code Status   DVT prophylaxis: SCDs Start: 07/16/22 1847     Code Status: DNR  Family Communication: Plan of care was discussed with Mckenzie Gutierrez, daughter, over the phone Disposition Plan: Plan to discharge to SNF when medically stable   Status is: Inpatient Remains inpatient appropriate because: Subdural  hematoma       Subjective:   Interval events noted.  Patient is nonverbal and cannot provide any history   Objective:    Vitals:   07/18/22 0424 07/18/22 0434 07/18/22 0834 07/18/22 1240  BP: (!) 164/92 (!) 176/64 (!) 157/66 (!) 159/83  Pulse: 74  (!) 57 66  Resp: '20  18 12  '$ Temp: 97.8 F (36.6 C)  98.3 F (36.8 C) 97.7 F (36.5 C)  TempSrc:   Oral Oral  SpO2: 100%  100% 100%  Weight:      Height:       No data found.   Intake/Output Summary (Last 24 hours) at 07/18/2022 1411 Last data filed at 07/18/2022 0900 Gross per 24 hour  Intake 503.18 ml  Output 800 ml  Net -296.82 ml   Filed Weights   07/16/22 1345 07/16/22 1451  Weight: 84.4 kg 84.3 kg    Exam:   GEN: NAD SKIN: Warm and dry EYES: EOMI ENT: MMM CV: RRR PULM: CTA B ABD: soft, ND, NT, +BS CNS: Nonverbal/aphasic, she moves left upper extremity spontaneously but she does not move her right upper extremity. No movement seen in both lower extremities EXT: No edema or tenderness        Data Reviewed:   I have personally reviewed following labs and imaging studies:  Labs: Labs show the following:   Basic Metabolic Panel: Recent Labs  Lab 07/16/22 1523 07/17/22 0547  NA 136 139  K 4.7 4.0  CL 107 109  CO2 24 24  GLUCOSE 143* 108*  BUN 19 20  CREATININE 0.77 0.83  CALCIUM 8.6* 8.6*   GFR Estimated Creatinine Clearance: 42.2 mL/min (by C-G formula based on SCr of 0.83 mg/dL). Liver Function Tests: No results for input(s): "AST", "ALT", "ALKPHOS", "BILITOT", "PROT", "ALBUMIN" in the last 168 hours. No results for input(s): "LIPASE", "AMYLASE" in the last 168 hours. No results for input(s): "AMMONIA" in the last 168 hours. Coagulation profile Recent Labs  Lab 07/16/22 1804 07/17/22 0547  INR 1.5* 1.3*    CBC: Recent Labs  Lab 07/16/22 1523 07/17/22 0547  WBC 10.3 9.1  NEUTROABS 8.8* 6.1  HGB 9.2* 8.8*  HCT 27.5* 26.6*  MCV 99.3 99.3  PLT 187 188   Cardiac  Enzymes: No results for input(s): "CKTOTAL", "CKMB", "CKMBINDEX", "TROPONINI" in the last 168 hours. BNP (last 3 results) No results for input(s): "PROBNP" in the last 8760 hours. CBG: Recent Labs  Lab 07/17/22 1027  GLUCAP 101*   D-Dimer: No results for input(s): "DDIMER" in the last 72 hours. Hgb A1c: No results for input(s): "HGBA1C" in the last 72 hours. Lipid Profile: No results for input(s): "CHOL", "HDL", "LDLCALC", "TRIG", "CHOLHDL", "LDLDIRECT" in the last 72 hours. Thyroid function studies: No results for input(s): "TSH", "T4TOTAL", "T3FREE", "THYROIDAB"  in the last 72 hours.  Invalid input(s): "FREET3" Anemia work up: Recent Labs    07/17/22 0547  FERRITIN 144  TIBC 213*  IRON 31   Sepsis Labs: Recent Labs  Lab 07/16/22 1523 07/17/22 0547  WBC 10.3 9.1    Microbiology Recent Results (from the past 240 hour(s))  Microscopic Examination     Status: None   Collection Time: 07/11/22  2:20 PM   Urine  Result Value Ref Range Status   WBC, UA >30W 0 - 5 /hpf Final   RBC, Urine 0-2 0 - 2 /hpf Final   Epithelial Cells (non renal) 0-10 0 - 10 /hpf Final   Bacteria, UA Few None seen/Few Final  Urine Culture     Status: None   Collection Time: 07/12/22  3:18 PM   Specimen: Urine   UR  Result Value Ref Range Status   Urine Culture, Routine Final report  Final   Organism ID, Bacteria Comment  Final    Comment: Mixed urogenital flora Greater than 100,000 colony forming units per mL    ORGANISM ID, BACTERIA Not applicable  Final  SARS Coronavirus 2 by RT PCR (hospital order, performed in [facility] hospital lab) *cepheid single result test* Urine, Clean Catch     Status: None   Collection Time: 07/16/22  6:04 PM   Specimen: Urine, Clean Catch; Nasal Swab  Result Value Ref Range Status   SARS Coronavirus 2 by RT PCR NEGATIVE NEGATIVE Final    Comment: (NOTE) SARS-CoV-2 target nucleic acids are NOT DETECTED.  The SARS-CoV-2 RNA is generally detectable in  upper and lower respiratory specimens during the acute phase of infection. The lowest concentration of SARS-CoV-2 viral copies this assay can detect is 250 copies / mL. A negative result does not preclude SARS-CoV-2 infection and should not be used as the sole basis for treatment or other patient management decisions.  A negative result may occur with improper specimen collection / handling, submission of specimen other than nasopharyngeal swab, presence of viral mutation(s) within the areas targeted by this assay, and inadequate number of viral copies (<250 copies / mL). A negative result must be combined with clinical observations, patient history, and epidemiological information.  Fact Sheet for Patients:   https://www.patel.info/  Fact Sheet for Healthcare Providers: https://hall.com/  This test is not yet approved or  cleared by the [place] FDA and has been authorized for detection and/or diagnosis of SARS-CoV-2 by FDA under an Emergency Use Authorization (EUA).  This EUA will remain in effect (meaning this test can be used) for the duration of the COVID-19 declaration under Section 564(b)(1) of the Act, 21 U.S.C. section 360bbb-3(b)(1), unless the authorization is terminated or revoked sooner.  Performed at [facility], [address] [place], [place] [address]     Procedures and diagnostic studies:  EEG adult  Result Date: 07/17/2022 Mckenzie Havens, MD     07/17/2022  2:21 PM Patient Name: JENIFFER CULLIVER MRN: [id number] Epilepsy Attending: Lora Gutierrez Referring Physician/Provider: Kerney Elbe, MD Date: 07/17/2022 Duration: 22.25 mins Patient history: 88yo F ith sub dural hematoma. EEG to evaluate for seizure. Level of alertness: Awake, asleep AEDs during EEG study: Ativan Technical aspects: This EEG study was done with scalp electrodes positioned according to the 10-20 International system of electrode  placement. Electrical activity was reviewed with band pass filter of 1-'70Hz'$ , sensitivity of 7 uV/mm, display speed of 47m/sec with a '60Hz'$  notched filter applied as appropriate. EEG data  were recorded continuously and digitally stored.  Video monitoring was available and reviewed as appropriate. Description: No clear posterior dominant rhythm was seen. Sleep was characterized by vertex waves, sleep spindles (12 to 14 Hz), maximal frontocentral region.  EEG showed continuous 3-'6Hz'$  theta-delta slowing admixed with 2-'14hz'$  beta activity in right hemisphere. There is also continuous low amplitude 3-'5Hz'$  theta-delta slowing. Sharp waves were noted in left frontal region. Hyperventilation and photic stimulation were not performed.   ABNORMALITY - Sharp wave, left frontal region - Continuous slow, generalized and lateralized left hemisphere IMPRESSION: This study showed evidence of potential epileptogenicity arising from left frontal region. There is cortical dysfunction in left hemisphere likely secondary to underlying  SDH. Additionally there is moderate diffuse encephalopathy, nonspecific etiology. No seizures were seen throughout the recording. Mckenzie Gutierrez   CT ANGIO HEAD W OR WO CONTRAST  Result Date: 07/17/2022 CLINICAL DATA:  88 year old female with left subdural hematoma after a fall. EXAM: CT ANGIOGRAPHY HEAD TECHNIQUE: Multidetector CT imaging of the head was performed using the standard protocol during bolus administration of intravenous contrast. Multiplanar CT image reconstructions and MIPs were obtained to evaluate the vascular anatomy. RADIATION DOSE REDUCTION: This exam was performed according to the departmental dose-optimization program which includes automated exposure control, adjustment of the mA and/or kV according to patient size and/or use of iterative reconstruction technique. CONTRAST:  14m OMNIPAQUE IOHEXOL 350 MG/ML SOLN COMPARISON:  Head and cervical spine CT yesterday. FINDINGS: CT  HEAD Brain: Calcified atherosclerosis at the skull base. Lobulated and hyperdense hemispheric left subdural hematoma appears stable from yesterday measuring 10-11 mm in thickness along the left cerebral convexity (coronal image 34) and up to 11 mm thickness along the inter hemispheric fissure also. Small volume bilateral tentorial subdural hyperdense blood is stable. Trace right middle cranial fossa subdural also possible. Mild rightward midline shift of 4 mm is stable. Trace intraventricular hemorrhage layering in the occipital horns has not significantly changed. No ventriculomegaly. Basilar cisterns remain patent. No new intracranial hemorrhage identified. Stable gray-white matter differentiation throughout the brain. No cortically based acute infarct identified. Calvarium and skull base: No skull fracture identified. Paranasal sinuses: Partial left mastoid opacification is stable. Tympanic cavities, other paranasal sinuses and mastoids remain clear. Orbits: Stable orbits and scalp. CTA HEAD Posterior circulation: Codominant distal vertebral arteries with atherosclerosis, but only mild distal vertebral stenosis. Patent vertebrobasilar junction and basilar artery without stenosis. Patent SCA and PCA origins. Tortuous basilar. Posterior communicating arteries are diminutive or absent. Bilateral PCA branches are patent, with mild P1 and P2 segment atherosclerosis and irregularity. Anterior circulation: Both ICA siphons are patent, although heavily calcified through the cavernous and proximal supraclinoid segments. Subsequent moderate to severe bilateral distal siphon stenosis. But the carotid termini remain patent. Patent MCA and ACA origins. Normal anterior communicating artery. Bilateral ACA branches are within normal limits. Left MCA origin is calcified with mild stenosis. Left M1 segment and bifurcation are patent without stenosis. Left MCA branches appear normal aside from mild subdural hematoma related mass  effect. Right MCA M1 segment and bifurcation are patent without stenosis. Right MCA branches are within normal limits. Venous sinuses: Displaced intracranial venous structures but no contrast extravasation identified into the subdural hematoma collections. Superior sagittal sinus and other major dural venous sinuses are enhancing and appear to be patent. Anatomic variants: None significant. Review of the MIP images confirms the above findings IMPRESSION: 1. Stable bilateral subdural hematomas, trace intraventricular blood since yesterday. Stable rightward midline shift of 4 mm. Basilar cisterns  remain patent. 2. No CTA contrast extravasation identified into the subdural collections. Mild hematoma related mass effect on the Left MCA branches. 3. Intracranial atherosclerosis. No significant posterior circulation stenosis. But heavily calcified ICA siphons with subsequent moderate to severe bilateral distal ICA stenosis. But no large vessel occlusion. 4. No new intracranial abnormality identified. Electronically Signed   By: Genevie Ann M.D.   On: 07/17/2022 11:37   CT HEAD WO CONTRAST (5MM)  Result Date: 07/16/2022 CLINICAL DATA:  Subarachnoid hemorrhage, history of fall EXAM: CT HEAD WITHOUT CONTRAST TECHNIQUE: Contiguous axial images were obtained from the base of the skull through the vertex without intravenous contrast. RADIATION DOSE REDUCTION: This exam was performed according to the departmental dose-optimization program which includes automated exposure control, adjustment of the mA and/or kV according to patient size and/or use of iterative reconstruction technique. COMPARISON:  07/16/2022 at 3:50 p.m. FINDINGS: Brain: Subdural hematoma is again identified throughout the left cerebral hemisphere, and extending along the falx and tentorium. The subdural hematoma measures up to 0.9 cm in the left frontal region, previously 1.2 cm. Anterior falcine portion of the subdural hematoma measures up to 1.4 cm in  thickness, previously 1.6 cm. There is persistent mass effect, with rightward midline shift measuring approximately 0.4 cm, previously measuring approximately 0.5 cm. No evidence of acute infarct. Lateral ventricles and midline structures are stable. Vascular: No hyperdense vessel or unexpected calcification. Skull: Normal. Negative for fracture or focal lesion. Sinuses/Orbits: No acute finding. Other: None. IMPRESSION: 1. Stable subdural hematoma along the left cerebral hemisphere, falx, and tentorium. Stable mass effect and rightward midline shift measuring 0.4 cm. 2. No acute infarct. Electronically Signed   By: Randa Ngo M.D.   On: 07/16/2022 21:49   CT Lumbar Spine Wo Contrast  Result Date: 07/16/2022 CLINICAL DATA:  Back trauma.  Post fall on Monday night. EXAM: CT LUMBAR SPINE WITHOUT CONTRAST TECHNIQUE: Multidetector CT imaging of the lumbar spine was performed without intravenous contrast administration. Multiplanar CT image reconstructions were also generated. RADIATION DOSE REDUCTION: This exam was performed according to the departmental dose-optimization program which includes automated exposure control, adjustment of the mA and/or kV according to patient size and/or use of iterative reconstruction technique. COMPARISON:  Radiographs dated February 16, 2016. FINDINGS: Segmentation: 5 lumbar type vertebrae. Alignment: Mild levoscoliosis centered at L3 vertebral body. Straightening of the lumbar spine. Vertebrae: Compression deformity of L3 vertebral body with approximately 60% central vertebral body height loss. No evidence of retropulsion into the spinal canal. Paraspinal and other soft tissues: Rim calcified gallstone. Advanced atherosclerotic disease of abdominal aorta which is normal in caliber. No acute abnormality. No paraspinal soft tissue hematoma or fluid collection. Disc levels: Advanced multilevel degenerative disease with disc height loss and vacuum disc phenomena prominent at L3-L4  L4-L5 and L5-S1. T12-L1: Significant disc bulge, spinal canal or neural foraminal stenosis. L1-L2: Disc osteophyte complex with mild spinal canal stenosis. No significant neural foraminal stenosis. L2-L3: Disc osteophyte complex with disc bulge and spinal canal stenosis. Mild bilateral neural foraminal stenosis. L3-L4: Broad-based disc bulge with narrowing of lateral recesses bilaterally. Mild-to-moderate right neural foraminal stenosis. Moderate facet joint arthropathy. L4-L5: Disc height loss with disc osteophyte complex. Facet joint arthropathy. Moderate spinal canal stenosis. Moderate bilateral neural foraminal stenosis. L5-S1: Broad-based disc bulge and facet joint arthropathy with moderate lateral recess stenosis. Moderate bilateral neural foraminal stenosis. IMPRESSION: 1. Compression deformity of L3 vertebral body with approximately 60% central vertebral body height loss. No evidence of retropulsion into the spinal canal.  This is new since prior radiograph, and may represent acute or chronic process. Correlate with localized pain. 2. Advanced multilevel degenerative disease with disc height loss and vacuum disc phenomena prominent at L3-L4, L4-L5 and L5-S1. 3. Multilevel disc osteophyte complexes with spinal canal stenosis and neural foraminal stenosis as described above. 4. Cholelithiasis. 5. Aortic atherosclerosis. Electronically Signed   By: Keane Police D.O.   On: 07/16/2022 16:27   CT HEAD WO CONTRAST (5MM)  Result Date: 07/16/2022 CLINICAL DATA:  Fall 6 to 7 days ago. EXAM: CT HEAD WITHOUT CONTRAST CT CERVICAL SPINE WITHOUT CONTRAST TECHNIQUE: Multidetector CT imaging of the head and cervical spine was performed following the standard protocol without intravenous contrast. Multiplanar CT image reconstructions of the cervical spine were also generated. RADIATION DOSE REDUCTION: This exam was performed according to the departmental dose-optimization program which includes automated exposure control,  adjustment of the mA and/or kV according to patient size and/or use of iterative reconstruction technique. COMPARISON:  05/02/2021. FINDINGS: CT HEAD FINDINGS Brain: There is acute/recent hyperattenuating intracranial hemorrhage. Specifically, hyperattenuating subdural hemorrhage extends over the left cerebral hemisphere, most prominent over the left frontal lobe where it measures 12 mm in thickness. Subdural hemorrhage also extends along the interhemispheric fissure, measuring up to 1.5 cm in thickness. Additional subdural hemorrhage extends along the right and left aspects of the tentorium. Suspect a small amount of medial left frontal lobe subarachnoid hemorrhage incontinuity with subdural hemorrhage. No parenchymal hemorrhage. Subdural hemorrhage causes mass effect, midline shift to the right of 6 mm, with partial effacement right-sided sulci and gyral flattening. No parenchymal or extra-axial masses. No evidence of an ischemic infarct. No hydrocephalus. Vascular: No hyperdense vessel or unexpected calcification. Skull: Normal. Negative for fracture or focal lesion. Sinuses/Orbits: Globes and orbits are unremarkable. Visualized sinuses are essentially clear. Other: None. CT CERVICAL SPINE FINDINGS Alignment: Normal. Skull base and vertebrae: No acute fracture. No primary bone lesion or focal pathologic process. Soft tissues and spinal canal: No prevertebral fluid or swelling. No visible canal hematoma. Disc levels: Moderate to marked loss of disc height at C5-C6 and C6-C7 with endplate spurring and disc bulging. No convincing disc herniation. Mild facet degenerative changes most evident on the left at C2-C3. Upper chest: No acute or significant findings. Other: None. IMPRESSION: HEAD CT 1. Acute/recent subdural hemorrhage extending over the left cerebral hemisphere, along the interhemispheric fissure and across the tentorium, with mass effect and midline shift to the right. Critical Value/emergent results were  called by telephone at the time of interpretation on 07/16/2022 at 4:21 pm to provider Duffy Bruce , who verbally acknowledged these results. 2. No hydrocephalus.  No acute infarct. CERVICAL CT 1. No fracture or acute finding. Electronically Signed   By: Lajean Manes M.D.   On: 07/16/2022 16:22   CT Cervical Spine Wo Contrast  Result Date: 07/16/2022 CLINICAL DATA:  Fall 6 to 7 days ago. EXAM: CT HEAD WITHOUT CONTRAST CT CERVICAL SPINE WITHOUT CONTRAST TECHNIQUE: Multidetector CT imaging of the head and cervical spine was performed following the standard protocol without intravenous contrast. Multiplanar CT image reconstructions of the cervical spine were also generated. RADIATION DOSE REDUCTION: This exam was performed according to the departmental dose-optimization program which includes automated exposure control, adjustment of the mA and/or kV according to patient size and/or use of iterative reconstruction technique. COMPARISON:  05/02/2021. FINDINGS: CT HEAD FINDINGS Brain: There is acute/recent hyperattenuating intracranial hemorrhage. Specifically, hyperattenuating subdural hemorrhage extends over the left cerebral hemisphere, most prominent over the left  frontal lobe where it measures 12 mm in thickness. Subdural hemorrhage also extends along the interhemispheric fissure, measuring up to 1.5 cm in thickness. Additional subdural hemorrhage extends along the right and left aspects of the tentorium. Suspect a small amount of medial left frontal lobe subarachnoid hemorrhage incontinuity with subdural hemorrhage. No parenchymal hemorrhage. Subdural hemorrhage causes mass effect, midline shift to the right of 6 mm, with partial effacement right-sided sulci and gyral flattening. No parenchymal or extra-axial masses. No evidence of an ischemic infarct. No hydrocephalus. Vascular: No hyperdense vessel or unexpected calcification. Skull: Normal. Negative for fracture or focal lesion. Sinuses/Orbits: Globes  and orbits are unremarkable. Visualized sinuses are essentially clear. Other: None. CT CERVICAL SPINE FINDINGS Alignment: Normal. Skull base and vertebrae: No acute fracture. No primary bone lesion or focal pathologic process. Soft tissues and spinal canal: No prevertebral fluid or swelling. No visible canal hematoma. Disc levels: Moderate to marked loss of disc height at C5-C6 and C6-C7 with endplate spurring and disc bulging. No convincing disc herniation. Mild facet degenerative changes most evident on the left at C2-C3. Upper chest: No acute or significant findings. Other: None. IMPRESSION: HEAD CT 1. Acute/recent subdural hemorrhage extending over the left cerebral hemisphere, along the interhemispheric fissure and across the tentorium, with mass effect and midline shift to the right. Critical Value/emergent results were called by telephone at the time of interpretation on 07/16/2022 at 4:21 pm to provider Duffy Bruce , who verbally acknowledged these results. 2. No hydrocephalus.  No acute infarct. CERVICAL CT 1. No fracture or acute finding. Electronically Signed   By: Lajean Manes M.D.   On: 07/16/2022 16:22               LOS: 2 days   Mayvis Agudelo  [facility]   Pager on [email or web address]. If 7PM-7AM, please contact night-coverage at [email or web address]     07/18/2022, 2:11 PM

## 2022-07-18 NOTE — Progress Notes (Signed)
Patient currently in the hospital 

## 2022-07-18 NOTE — Evaluation (Signed)
Physical Therapy Evaluation Patient Details Name: Mckenzie Gutierrez MRN: [id number] DOB: 10-23-1921 Today's Date: 07/18/2022  History of Present Illness  Mckenzie Gutierrez is a 88 y.o. female with medical history significant of paroxysmal atrial fibrillation on Eliquis, CVA, hypertension, hyperlipidemia, PVD, hypothyroidism, who presented to the hospital with severe back pain after a fall on the evening of either 07/11/2022 or 07/12/2022.  Work-up revealed left subdural hematoma with mass effect and midline shift to the right, likely subacute, and L3 vertebral compression fracture. Neurosurgery recommended conservative mgmt. R sided seizure activity noted on 07/17/22 resulting in R sided weakness and global aphasia.   Clinical Impression  Patient laying in bed, multiple family members in the room. They report the patient lives alone with family checking in frequently and the patient uses cane or rolling walker intermittently with ambulation at baseline.  Today, the patient has has a left gaze preference initially. Elevated the head of bed to around 50 degrees without grimacing noted. The patient looks more alert/awake with the head of bed elevated and is able to look to the right side with stimulation. She is moving both legs in the bed (left more than right) and is able to follow single step commands inconsistently. Family education provided on environmental factors to increase right side awareness and looking to the right. Did not attempt to mobilize the patient to the edge of bed due to cognition and family concerns that she may experience back pain. Recommend for PT to follow up on a trial basis pending family goals of care.      Recommendations for follow up therapy are one component of a multi-disciplinary discharge planning process, led by the attending physician.  Recommendations may be updated based on patient status, additional functional criteria and insurance authorization.  Follow Up  Recommendations  (SNF vs HHPT with maximal family support and DME)      Assistance Recommended at Discharge Frequent or constant Supervision/Assistance  Patient can return home with the following  Two people to help with walking and/or transfers;A lot of help with bathing/dressing/bathroom;Help with stairs or ramp for entrance;Assist for transportation    Equipment Recommendations  (to be determined)  Recommendations for Other Services       Functional Status Assessment Patient has had a recent decline in their functional status and demonstrates the ability to make significant improvements in function in a reasonable and predictable amount of time.     Precautions / Restrictions Precautions Precautions: Fall Required Braces or Orthoses: Spinal Brace (for comfort per notes) Restrictions Weight Bearing Restrictions: No      Mobility  Bed Mobility               General bed mobility comments: head of bed elevated to ~ 50 degrees with no grimicing. when stimulated, patient looks more alert, looking around the room. sitting on the edge of bed not attempted due to cognition and family fear that it will cause back pain. will trial mobility next session if appropriate    Transfers                        Ambulation/Gait                  Stairs            Wheelchair Mobility    Modified Rankin (Stroke Patients Only)       Balance  Pertinent Vitals/Pain Pain Assessment Pain Assessment: Faces Faces Pain Scale: Hurts a little bit Pain Location: hair with combing knots Pain Descriptors / Indicators: Grimacing Pain Intervention(s): Limited activity within patient's tolerance    Home Living Family/patient expects to be discharged to:: Private residence Living Arrangements: Children Available Help at Discharge: Family;Available PRN/intermittently;Personal care attendant Type of Home:  House Home Access: Stairs to enter   Entrance Stairs-Number of Steps: 4   Home Layout: One level Home Equipment: Conservation officer, nature (2 wheels);Cane - single point Additional Comments: has a "helper" 3 days per week at home    Prior Function Prior Level of Function : Independent/Modified Independent             Mobility Comments: PRN use of cane or rolling walker, furniture walker at home ADLs Comments: assist for IADLs     Hand Dominance   Dominant Hand: Right    Extremity/Trunk Assessment   Upper Extremity Assessment Upper Extremity Assessment: Defer to OT evaluation RUE Deficits / Details: 0/5 grossly, no response to noxious stimuli LUE Deficits / Details: 3/5 grossly, squeezes hand and raises arm on command    Lower Extremity Assessment Lower Extremity Assessment: Generalized weakness;Difficult to assess due to impaired cognition (patient moving BLE frequently in bed (left more than right) but unable to follow commands for formal MMT)       Communication   Communication: Expressive difficulties (non verbal today)  Cognition Arousal/Alertness: Awake/alert Behavior During Therapy: Flat affect Overall Cognitive Status: Impaired/Different from baseline Area of Impairment: Following commands                       Following Commands: Follows one step commands inconsistently       General Comments: patient is non verbal. she has a left gaze preference but can track past midline and look to the right with cues. she is able to follow ~ 25% of single step commands with increased time. when preseted with a drink, she holds the drink in her left and and brings to mouth appropriately. when presented with a comb, she placed the comb to her mouth.        General Comments General comments (skin integrity, edema, etc.): family educated to sit on the right side to promote increased right side awareness    Exercises     Assessment/Plan    PT Assessment Patient needs  continued PT services  PT Problem List Decreased strength;Decreased range of motion;Decreased activity tolerance;Decreased balance;Decreased mobility;Decreased cognition;Decreased safety awareness       PT Treatment Interventions DME instruction;Gait training;Functional mobility training;Therapeutic activities;Therapeutic exercise;Balance training;Neuromuscular re-education;Cognitive remediation;Patient/family education    PT Goals (Current goals can be found in the Care Plan section)  Acute Rehab PT Goals Patient Stated Goal: family goal is to speak with the doctor to determine goals of care PT Goal Formulation: With family Time For Goal Achievement: 08/01/22 Potential to Achieve Goals: Fair (guarded)    Frequency Min 2X/week     Co-evaluation   Reason for Co-Treatment: Complexity of the patient's impairments (multi-system involvement);Necessary to address cognition/behavior during functional activity;For patient/therapist safety;To address functional/ADL transfers PT goals addressed during session: Mobility/safety with mobility OT goals addressed during session: ADL's and self-care       AM-PAC PT "6 Clicks" Mobility  Outcome Measure Help needed turning from your back to your side while in a flat bed without using bedrails?: A Lot Help needed moving from lying on your back to sitting on the side of  a flat bed without using bedrails?: Total Help needed moving to and from a bed to a chair (including a wheelchair)?: Total Help needed standing up from a chair using your arms (e.g., wheelchair or bedside chair)?: Total Help needed to walk in hospital room?: Total Help needed climbing 3-5 steps with a railing? : Total 6 Click Score: 7    End of Session Equipment Utilized During Treatment: Oxygen Activity Tolerance: Patient tolerated treatment well Patient left: in bed;with call bell/phone within reach;with bed alarm set;with family/visitor present (multiple visitors present)   PT  Visit Diagnosis: Muscle weakness (generalized) (M62.81);Other symptoms and signs involving the nervous system (R29.898)    Time: 4944-9675 PT Time Calculation (min) (ACUTE ONLY): 30 min   Charges:   PT Evaluation $PT Eval Low Complexity: 1 Low PT Treatments $Therapeutic Activity: 8-22 mins        Minna Merritts, PT, MPT   Percell Locus 07/18/2022, 4:15 PM

## 2022-07-18 NOTE — Evaluation (Signed)
Occupational Therapy Evaluation Patient Details Name: ADANYA SOSINSKI MRN: [id number] DOB: [birth year] Today's Date: 07/18/2022   History of Present Illness DANIAH ZALDIVAR is a 88 y.o. female with medical history significant of paroxysmal atrial fibrillation on Eliquis, CVA, hypertension, hyperlipidemia, PVD, hypothyroidism, who presented to the hospital with severe back pain after a fall on the evening of either 07/11/2022 or 07/12/2022.  Work-up revealed left subdural hematoma with mass effect and midline shift to the right, likely subacute, and L3 vertebral compression fracture. Neurosurgery recommended conservative mgmt. R sided seizure activity noted on 07/17/22 resulting in R sided weakness and global aphasia.   Clinical Impression   Ms Pal was seen for OT evaluation this date. Prior to hospital admission, pt was MOD I for mobility and ADLs using RW or SPC. Pt lives alone with family/PCA available PRN. Pt presents to acute OT demonstrating impaired ADL performance and functional mobility 2/2 dominant RUE impaired functional use, decreased activity tolerance and decreased command following. RUW currently 0/5 grossly, no response to noxious stimuli; LUE 3/5 grossly, squeezes hand and raises arm on command. L gaze preference noted, can track across midline with cues.  Pt currently requires SETUP + SUPERVISION self-drinking at bed level (HOB elevated 60*) with pt reaching for cup and reaching to hand cup back to therapist. MIN cues face washing at bed level for bottom half of face, MIN A hand over hand for top half. MAX A hair brushing, attempts to place comb in mouth. Pt appears fatigued from trial of modified bed in chair position, will attempt mobility next session. Pt would benefit from skilled OT. Upon hospital discharge, recommend STR however if family chooses to return home would require [facility], [place], and hospital bed/lift equipment.    Recommendations for follow up therapy are  one component of a multi-disciplinary discharge planning process, led by the attending physician.  Recommendations may be updated based on patient status, additional functional criteria and insurance authorization.   Follow Up Recommendations  Skilled nursing-short term rehab (<3 hours/day) (if family chooses to return home would require [place], [place], and max DME)     Assistance Recommended at Discharge Frequent or constant Supervision/Assistance  Patient can return home with the following Two people to help with walking and/or transfers;Two people to help with bathing/dressing/bathroom    Functional Status Assessment  Patient has had a recent decline in their functional status and demonstrates the ability to make significant improvements in function in a reasonable and predictable amount of time.  Equipment Recommendations  BSC/3in1;Hospital bed    Recommendations for Other Services       Precautions / Restrictions Precautions Precautions: Fall Required Braces or Orthoses: Spinal Brace (for comfort) Restrictions Weight Bearing Restrictions: No      Mobility Bed Mobility               General bed mobility comments: Tolerated HOB elevated, will trial mobility next session    Transfers            Unsafe to attempt                  ADL either performed or assessed with clinical judgement   ADL Overall ADL's : Needs assistance/impaired                                       General ADL Comments: SETUP + SUPERVISION self-drinking at bed  level (HOB elevated 60*). MIN cues face washing at bed level for bottom half of face, MIN A hand over hand for top half. MAX A hair brushing, attempts to place comb in mouth.     Vision   Additional Comments: L gaze preference noted, can track across midline            Pertinent Vitals/Pain Pain Assessment Pain Assessment: Faces Faces Pain Scale: Hurts a little bit Pain Location: hair with  combing knots Pain Descriptors / Indicators: Discomfort, Grimacing Pain Intervention(s): Limited activity within patient's tolerance     Hand Dominance Right   Extremity/Trunk Assessment Upper Extremity Assessment Upper Extremity Assessment: RUE deficits/detail;LUE deficits/detail RUE Deficits / Details: 0/5 grossly, no response to noxious stimuli LUE Deficits / Details: 3/5 grossly, squeezes hand and raises arm on command   Lower Extremity Assessment Lower Extremity Assessment: Generalized weakness       Communication Communication Communication: Expressive difficulties (non verbal, does not nod/shake head to communicate this date)   Cognition Arousal/Alertness: Awake/alert Behavior During Therapy: Flat affect Overall Cognitive Status: Impaired/Different from baseline Area of Impairment: Following commands                       Following Commands: Follows one step commands inconsistently       General Comments: Pt unable to correctly use a comb when presented however does use wash cloth and a drink appropriately.      Home Living Family/patient expects to be discharged to:: Private residence Living Arrangements: Children Available Help at Discharge: Family;Available PRN/intermittently;Personal care attendant Type of Home: House Home Access: Stairs to enter Entrance Stairs-Number of Steps: 4   Home Layout: One level               Home Equipment: Conservation officer, nature (2 wheels);Cane - single point          Prior Functioning/Environment Prior Level of Function : Independent/Modified Independent               ADLs Comments: assist for IADLs        OT Problem List: Decreased strength;Decreased range of motion;Decreased activity tolerance;Impaired balance (sitting and/or standing);Impaired vision/perception;Decreased cognition;Decreased safety awareness;Impaired UE functional use      OT Treatment/Interventions: Self-care/ADL training;Therapeutic  exercise;Energy conservation;DME and/or AE instruction;Patient/family education;Balance training;Cognitive remediation/compensation;Visual/perceptual remediation/compensation;Neuromuscular education    OT Goals(Current goals can be found in the care plan section) Acute Rehab OT Goals Patient Stated Goal: unable to state OT Goal Formulation: Patient unable to participate in goal setting Time For Goal Achievement: 08/01/22 Potential to Achieve Goals: Fair ADL Goals Pt Will Perform Grooming: with min assist;sitting Pt Will Perform Lower Body Dressing: with mod assist;bed level Pt Will Transfer to Toilet: with min assist (rolling bed level)  OT Frequency: Min 2X/week    Co-evaluation PT/OT/SLP Co-Evaluation/Treatment: Yes Reason for Co-Treatment: Complexity of the patient's impairments (multi-system involvement);Necessary to address cognition/behavior during functional activity;For patient/therapist safety;To address functional/ADL transfers PT goals addressed during session: Mobility/safety with mobility OT goals addressed during session: ADL's and self-care      AM-PAC OT "6 Clicks" Daily Activity     Outcome Measure Help from another person eating meals?: A Little Help from another person taking care of personal grooming?: A Little Help from another person toileting, which includes using toliet, bedpan, or urinal?: A Lot Help from another person bathing (including washing, rinsing, drying)?: A Lot Help from another person to put on and taking off regular upper body clothing?: A  Lot Help from another person to put on and taking off regular lower body clothing?: A Lot 6 Click Score: 14   End of Session    Activity Tolerance: Patient tolerated treatment well;Patient limited by fatigue Patient left: in bed;with call bell/phone within reach;with bed alarm set;with family/visitor present  OT Visit Diagnosis: Muscle weakness (generalized) (M62.81);Other abnormalities of gait and mobility  (R26.89);Hemiplegia and hemiparesis Hemiplegia - Right/Left: Right Hemiplegia - dominant/non-dominant: Dominant Hemiplegia - caused by: Other cerebrovascular disease                Time: 3729-0211 OT Time Calculation (min): 27 min Charges:  OT General Charges $OT Visit: 1 Visit OT Evaluation $OT Eval Moderate Complexity: 1 Mod OT Treatments $Self Care/Home Management : 8-22 mins  Dessie Coma, M.S. OTR/L  07/18/22, 4:03 PM  ascom [phone]

## 2022-07-19 DIAGNOSIS — I6200 Nontraumatic subdural hemorrhage, unspecified: Secondary | ICD-10-CM | POA: Diagnosis not present

## 2022-07-19 DIAGNOSIS — S065XAA Traumatic subdural hemorrhage with loss of consciousness status unknown, initial encounter: Secondary | ICD-10-CM | POA: Diagnosis not present

## 2022-07-19 DIAGNOSIS — S32030A Wedge compression fracture of third lumbar vertebra, initial encounter for closed fracture: Secondary | ICD-10-CM | POA: Diagnosis not present

## 2022-07-19 DIAGNOSIS — Z66 Do not resuscitate: Secondary | ICD-10-CM

## 2022-07-19 DIAGNOSIS — Z515 Encounter for palliative care: Secondary | ICD-10-CM | POA: Diagnosis not present

## 2022-07-19 MED ORDER — LEVETIRACETAM IN NACL 500 MG/100ML IV SOLN: 500.0000 mg | Freq: Two times a day (BID) | INTRAVENOUS | Status: AC

## 2022-07-19 MED ORDER — GLYCOPYRROLATE 0.2 MG/ML IJ SOLN
0.2000 mg | INTRAMUSCULAR | Status: DC | PRN
  Filled 2022-07-19: qty 1

## 2022-07-19 MED ORDER — LORAZEPAM 2 MG/ML IJ SOLN: 2.0000 mg | INTRAMUSCULAR | 0 refills | Status: AC | PRN

## 2022-07-19 MED ORDER — LIDOCAINE 5 % EX PTCH: 1.0000 | MEDICATED_PATCH | CUTANEOUS | 0 refills | Status: AC

## 2022-07-19 MED ORDER — FUROSEMIDE 10 MG/ML IJ SOLN
40.0000 mg | Freq: Every day | INTRAMUSCULAR | Status: DC
  Administered 2022-07-19: 40 mg via INTRAVENOUS
  Filled 2022-07-19: qty 4

## 2022-07-19 MED ORDER — BISACODYL 10 MG RE SUPP: 10.0000 mg | Freq: Every day | RECTAL | Status: DC | PRN

## 2022-07-19 MED ORDER — BIOTENE DRY MOUTH MT LIQD: 15.0000 mL | OROMUCOSAL | Status: AC | PRN

## 2022-07-19 MED ORDER — ONDANSETRON 4 MG PO TBDP: 4.0000 mg | ORAL_TABLET | Freq: Four times a day (QID) | ORAL | Status: DC | PRN

## 2022-07-19 MED ORDER — PANTOPRAZOLE SODIUM 40 MG IV SOLR
20.0000 mg | Freq: Every day | INTRAVENOUS | Status: DC
  Administered 2022-07-19: 20 mg via INTRAVENOUS
  Filled 2022-07-19: qty 10

## 2022-07-19 MED ORDER — GLYCOPYRROLATE 1 MG PO TABS: 1.0000 mg | ORAL_TABLET | ORAL | Status: AC | PRN

## 2022-07-19 MED ORDER — GLYCOPYRROLATE 1 MG PO TABS: 1.0000 mg | ORAL_TABLET | ORAL | Status: DC | PRN

## 2022-07-19 MED ORDER — POLYVINYL ALCOHOL 1.4 % OP SOLN: 1.0000 [drp] | Freq: Four times a day (QID) | OPHTHALMIC | Status: DC | PRN

## 2022-07-19 MED ORDER — GLYCOPYRROLATE 0.2 MG/ML IJ SOLN: 0.2000 mg | INTRAMUSCULAR | Status: DC | PRN

## 2022-07-19 MED ORDER — BIOTENE DRY MOUTH MT LIQD: 15.0000 mL | OROMUCOSAL | Status: DC | PRN

## 2022-07-19 MED ORDER — ONDANSETRON HCL 4 MG/2ML IJ SOLN: 4.0000 mg | Freq: Four times a day (QID) | INTRAMUSCULAR | Status: DC | PRN

## 2022-07-19 MED ORDER — ONDANSETRON 4 MG PO TBDP: 4.0000 mg | ORAL_TABLET | Freq: Four times a day (QID) | ORAL | 0 refills | Status: AC | PRN

## 2022-07-19 MED ORDER — POLYVINYL ALCOHOL 1.4 % OP SOLN: 1.0000 [drp] | Freq: Four times a day (QID) | OPHTHALMIC | 0 refills | Status: AC | PRN

## 2022-07-19 MED ORDER — HYDROCODONE-ACETAMINOPHEN 5-325 MG PO TABS: 1.0000 | ORAL_TABLET | Freq: Four times a day (QID) | ORAL | 0 refills | Status: AC | PRN

## 2022-07-19 NOTE — Progress Notes (Signed)
Pt alert and able to follow commands. No evidence of pain. Last BM is today. VSS. Report given to hospice RN; awaiting EMS transport.    07/19/22 1613  Vitals  Temp 98.3 F (36.8 C)  BP (!) 142/60  MAP (mmHg) 85  BP Location Left Arm  BP Method Automatic  Patient Position (if appropriate) Lying  Pulse Rate 82  Pulse Rate Source Monitor  ECG Heart Rate 77  Resp 16  MEWS COLOR  MEWS Score Color Comfort Care Only  Oxygen Therapy  SpO2 100 %  O2 Device Nasal Cannula  O2 Flow Rate (L/min) 4 L/min  Pain Assessment  Pain Scale 0-10  Pain Score 0

## 2022-07-19 NOTE — Progress Notes (Signed)
Manufacturing engineer [facility]) Hospital Liaison Note  Received request from [employer] [facility] [place] for family interest in [place]. Visited patient at bedside and spoke with daughter/Anita, son/Robert, DIL.[place] & neighbor to confirm interest and explain services.  Patient has been approved for [facility].  Consent forms have been completed.  EMS notified of patient D/C and transport arranged. TOC/Ashley and Attending Physician/Dr. Arbutus Ped also notified of transport arrangement.    Please send signed DNR form with patient and RN call report to [phone].    Daphene Calamity, MSW [facility] Liaison [phone]

## 2022-07-19 NOTE — Discharge Summary (Signed)
Physician Discharge Summary   Patient: Mckenzie Gutierrez MRN: [id number] DOB: 04-09-1922  Admit date:     07/16/2022  Discharge date: 07/19/22  Discharge Physician: Ezekiel Slocumb   PCP: Jon Billings, NP   Recommendations at discharge:    Follow up with hospice team  Discharge Diagnoses: Principal Problem:   Subdural hemorrhage ([place]) Active Problems:   Closed compression fracture of L3 vertebra (HCC)   Ground-level fall   Normocytic anemia   Atrial fibrillation ([facility])   Hypertension   Seizure ([place])   Subdural hematoma ([place])   Hospice care patient  Resolved Problems:   * No resolved hospital problems. *  Hospital Course: Mckenzie Gutierrez is a 88 y.o. female with medical history significant of paroxysmal atrial fibrillation on Eliquis, CVA, hypertension, hyperlipidemia, PVD, hypothyroidism , who presented to the hospital with severe back pain.  Reportedly, patient had a fall on the evening of either 07/11/2022 or 07/12/2022, when she got up in the middle of the night to use the bathroom.  She had been complaining of back pain since the fall and she also had difficulty walking with her walker.     Work-up in the ED revealed left subdural hematoma with mass effect and midline shift to the right, likely subacute, and L3 vertebral compression fracture.  He was treated with analgesics.  Patient had stated that she did not want any invasive management or surgical intervention even if it was required.  Repeat CT head showed stability in subdural hematoma.  Neurosurgeon recommended conservative management.  Assessment and Plan: Body mass index is 29.11 kg/m.     Left subdural hematoma with mass effect and midline shift to the right complicated by aphasia, right-sided weakness, seizures: Repeat CT head showed stability in subdural hematoma.  On admission, patient was clear-minded and had capacity to make decisions.  At that time, she stated that she did not want any invasive  management or surgical intervention even if surgery will be life-saving.   Patient had seizures and subsequent further decline in mentation and physical function. She is minimally interactive, and unable to eat or drink adequately. Palliative care consulted given poor prognosis.   Neurosurgery consulted, recommended hospice consideration to family. Family understanding of the dire situation and are agreeable. Hospice facility has bed available and accepted patient today. She is stable for transport. Remains on IV Keppra for seizure prevention. Comfort medications PRN ordered.     Seizure: Continue IV Keppra.  IV Ativan as needed for seizures   Paroxysmal atrial fibrillation: Eliquis has been discontinued   L3 vertebral compression fracture, s/p fall: Analgesics as needed for pain including Lidocaine patch.  Follow-up with neurosurgeon.  No objective signs of uncontrolled pain reported by staff or family.     Other comorbidities include hypertension, PVD, carotid stenosis, hypothyroidism, chronic anemia, MGUS        Consultants: Neurosurgery, Neurology, Palliative care  Procedures performed: None   Disposition: Hospice care  Diet recommendation:  Discharge Diet Orders (From admission, onward)     Start     Ordered   07/19/22 0000  Diet - low sodium heart healthy        07/19/22 1455           Regular diet DISCHARGE MEDICATION: Allergies as of 07/19/2022       Reactions   Celebrex [celecoxib] Other (See Comments)   Hard stools   Codeine Sulfate Other (See Comments)   Hydrochlorothiazide    Hytrin [terazosin]  Plendil [felodipine] Diarrhea, Nausea Only   Nitrofuran Derivatives Swelling, Rash   Nitrofurantoin Rash, Swelling        Medication List     STOP taking these medications    amLODipine 5 MG tablet Commonly known as: NORVASC   apixaban 2.5 MG Tabs tablet Commonly known as: ELIQUIS   beta carotene w/minerals tablet   cefpodoxime 100 MG  tablet Commonly known as: VANTIN   erythromycin ophthalmic ointment   ferrous sulfate 325 (65 FE) MG tablet   furosemide 40 MG tablet Commonly known as: LASIX   hydrocortisone 25 MG suppository Commonly known as: ANUSOL-HC   levothyroxine 75 MCG tablet Commonly known as: SYNTHROID   losartan 100 MG tablet Commonly known as: COZAAR   multivitamin tablet       TAKE these medications    acetaminophen 500 MG tablet Commonly known as: TYLENOL Take 1,000 mg by mouth 2 (two) times daily.   albuterol 108 (90 Base) MCG/ACT inhaler Commonly known as: VENTOLIN HFA INHALE 2 PUFFS INTO THE LUNGS EVERY SIX HOURS AS NEEDED FOR WHEEZING OR SHORTNESS OF BREATH   antiseptic oral rinse Liqd Apply 15 mLs topically as needed for dry mouth.   glycopyrrolate 1 MG tablet Commonly known as: ROBINUL Take 1 tablet (1 mg total) by mouth every 4 (four) hours as needed (excessive secretions).   HYDROcodone-acetaminophen 5-325 MG tablet Commonly known as: NORCO/VICODIN Take 1 tablet by mouth every 6 (six) hours as needed for severe pain.   levETIRAcetam 500 MG/100ML Soln Commonly known as: KEPRRA Inject 100 mLs (500 mg total) into the vein every 12 (twelve) hours. Start taking on: July 20, 2022   lidocaine 5 % Commonly known as: LIDODERM Place 1 patch onto the skin daily. Remove & Discard patch within 12 hours or as directed by MD   Linzess 145 MCG Caps capsule Generic drug: linaclotide TAKE ONE CAPSULE EACH MORNING BEFORE BREAKFAST   LORazepam 2 MG/ML injection Commonly known as: ATIVAN Inject 1 mL (2 mg total) into the vein every 4 (four) hours as needed for seizure (up to 10 mg per within 12 hr as needed for seizures).   ondansetron 4 MG disintegrating tablet Commonly known as: ZOFRAN-ODT Take 1 tablet (4 mg total) by mouth every 6 (six) hours as needed for nausea.   pantoprazole 20 MG tablet Commonly known as: PROTONIX TAKE 1 TABLET BY MOUTH DAILY   polyethylene glycol  17 g packet Commonly known as: MIRALAX / GLYCOLAX Take 17 g by mouth daily.   polyvinyl alcohol 1.4 % ophthalmic solution Commonly known as: LIQUIFILM TEARS Place 1 drop into both eyes 4 (four) times daily as needed for dry eyes.   sertraline 25 MG tablet Commonly known as: ZOLOFT TAKE 1 TABLET BY MOUTH DAILY        Discharge Exam: Filed Weights   07/16/22 1345 07/16/22 1451  Weight: 84.4 kg 84.3 kg   General exam: awake, no acute distress HEENT: opens eyes intermittently, moist mucus membranes, hard of hearing \ Respiratory system: CTAB, no wheezes, rales or rhonchi, normal respiratory effort. Cardiovascular system: normal S1/S2, RRR Gastrointestinal system: soft, NT, ND Central nervous system: limited exam, pt does not follow commands, non-verbal Skin: dry, intact, normal temperature Psychiatry:unable to assess due to lack on meaningful interaction   Condition at discharge: stable  The results of significant diagnostics from this hospitalization (including imaging, microbiology, ancillary and laboratory) are listed below for reference.   Imaging Studies: EEG adult  Result Date: Aug 12, 2022 Zeb Comfort  Jenetta Downer, MD     07/17/2022  2:21 PM Patient Name: AUBRIEL KHANNA MRN: [id number] Epilepsy Attending: Lora Havens Referring Physician/Provider: Kerney Elbe, MD Date: 07/17/2022 Duration: 22.25 mins Patient history: 88yo F ith sub dural hematoma. EEG to evaluate for seizure. Level of alertness: Awake, asleep AEDs during EEG study: Ativan Technical aspects: This EEG study was done with scalp electrodes positioned according to the 10-20 International system of electrode placement. Electrical activity was reviewed with band pass filter of 1-'70Hz'$ , sensitivity of 7 uV/mm, display speed of 11m/sec with a '60Hz'$  notched filter applied as appropriate. EEG data were recorded continuously and digitally stored.  Video monitoring was available and reviewed as appropriate. Description: No  clear posterior dominant rhythm was seen. Sleep was characterized by vertex waves, sleep spindles (12 to 14 Hz), maximal frontocentral region.  EEG showed continuous 3-'6Hz'$  theta-delta slowing admixed with 2-'14hz'$  beta activity in right hemisphere. There is also continuous low amplitude 3-'5Hz'$  theta-delta slowing. Sharp waves were noted in left frontal region. Hyperventilation and photic stimulation were not performed.   ABNORMALITY - Sharp wave, left frontal region - Continuous slow, generalized and lateralized left hemisphere IMPRESSION: This study showed evidence of potential epileptogenicity arising from left frontal region. There is cortical dysfunction in left hemisphere likely secondary to underlying  SDH. Additionally there is moderate diffuse encephalopathy, nonspecific etiology. No seizures were seen throughout the recording. Priyanka OBarbra Sarks  CT ANGIO HEAD W OR WO CONTRAST  Result Date: 07/17/2022 CLINICAL DATA:  88year old female with left subdural hematoma after a fall. EXAM: CT ANGIOGRAPHY HEAD TECHNIQUE: Multidetector CT imaging of the head was performed using the standard protocol during bolus administration of intravenous contrast. Multiplanar CT image reconstructions and MIPs were obtained to evaluate the vascular anatomy. RADIATION DOSE REDUCTION: This exam was performed according to the departmental dose-optimization program which includes automated exposure control, adjustment of the mA and/or kV according to patient size and/or use of iterative reconstruction technique. CONTRAST:  743mOMNIPAQUE IOHEXOL 350 MG/ML SOLN COMPARISON:  Head and cervical spine CT yesterday. FINDINGS: CT HEAD Brain: Calcified atherosclerosis at the skull base. Lobulated and hyperdense hemispheric left subdural hematoma appears stable from yesterday measuring 10-11 mm in thickness along the left cerebral convexity (coronal image 34) and up to 11 mm thickness along the inter hemispheric fissure also. Small volume  bilateral tentorial subdural hyperdense blood is stable. Trace right middle cranial fossa subdural also possible. Mild rightward midline shift of 4 mm is stable. Trace intraventricular hemorrhage layering in the occipital horns has not significantly changed. No ventriculomegaly. Basilar cisterns remain patent. No new intracranial hemorrhage identified. Stable gray-white matter differentiation throughout the brain. No cortically based acute infarct identified. Calvarium and skull base: No skull fracture identified. Paranasal sinuses: Partial left mastoid opacification is stable. Tympanic cavities, other paranasal sinuses and mastoids remain clear. Orbits: Stable orbits and scalp. CTA HEAD Posterior circulation: Codominant distal vertebral arteries with atherosclerosis, but only mild distal vertebral stenosis. Patent vertebrobasilar junction and basilar artery without stenosis. Patent SCA and PCA origins. Tortuous basilar. Posterior communicating arteries are diminutive or absent. Bilateral PCA branches are patent, with mild P1 and P2 segment atherosclerosis and irregularity. Anterior circulation: Both ICA siphons are patent, although heavily calcified through the cavernous and proximal supraclinoid segments. Subsequent moderate to severe bilateral distal siphon stenosis. But the carotid termini remain patent. Patent MCA and ACA origins. Normal anterior communicating artery. Bilateral ACA branches are within normal limits. Left MCA origin is calcified with mild stenosis. Left  M1 segment and bifurcation are patent without stenosis. Left MCA branches appear normal aside from mild subdural hematoma related mass effect. Right MCA M1 segment and bifurcation are patent without stenosis. Right MCA branches are within normal limits. Venous sinuses: Displaced intracranial venous structures but no contrast extravasation identified into the subdural hematoma collections. Superior sagittal sinus and other major dural venous  sinuses are enhancing and appear to be patent. Anatomic variants: None significant. Review of the MIP images confirms the above findings IMPRESSION: 1. Stable bilateral subdural hematomas, trace intraventricular blood since yesterday. Stable rightward midline shift of 4 mm. Basilar cisterns remain patent. 2. No CTA contrast extravasation identified into the subdural collections. Mild hematoma related mass effect on the Left MCA branches. 3. Intracranial atherosclerosis. No significant posterior circulation stenosis. But heavily calcified ICA siphons with subsequent moderate to severe bilateral distal ICA stenosis. But no large vessel occlusion. 4. No new intracranial abnormality identified. Electronically Signed   By: Genevie Ann M.D.   On: 07/17/2022 11:37   CT HEAD WO CONTRAST (5MM)  Result Date: 07/16/2022 CLINICAL DATA:  Subarachnoid hemorrhage, history of fall EXAM: CT HEAD WITHOUT CONTRAST TECHNIQUE: Contiguous axial images were obtained from the base of the skull through the vertex without intravenous contrast. RADIATION DOSE REDUCTION: This exam was performed according to the departmental dose-optimization program which includes automated exposure control, adjustment of the mA and/or kV according to patient size and/or use of iterative reconstruction technique. COMPARISON:  07/16/2022 at 3:50 p.m. FINDINGS: Brain: Subdural hematoma is again identified throughout the left cerebral hemisphere, and extending along the falx and tentorium. The subdural hematoma measures up to 0.9 cm in the left frontal region, previously 1.2 cm. Anterior falcine portion of the subdural hematoma measures up to 1.4 cm in thickness, previously 1.6 cm. There is persistent mass effect, with rightward midline shift measuring approximately 0.4 cm, previously measuring approximately 0.5 cm. No evidence of acute infarct. Lateral ventricles and midline structures are stable. Vascular: No hyperdense vessel or unexpected calcification.  Skull: Normal. Negative for fracture or focal lesion. Sinuses/Orbits: No acute finding. Other: None. IMPRESSION: 1. Stable subdural hematoma along the left cerebral hemisphere, falx, and tentorium. Stable mass effect and rightward midline shift measuring 0.4 cm. 2. No acute infarct. Electronically Signed   By: Randa Ngo M.D.   On: 07/16/2022 21:49   CT Lumbar Spine Wo Contrast  Result Date: 07/16/2022 CLINICAL DATA:  Back trauma.  Post fall on Monday night. EXAM: CT LUMBAR SPINE WITHOUT CONTRAST TECHNIQUE: Multidetector CT imaging of the lumbar spine was performed without intravenous contrast administration. Multiplanar CT image reconstructions were also generated. RADIATION DOSE REDUCTION: This exam was performed according to the departmental dose-optimization program which includes automated exposure control, adjustment of the mA and/or kV according to patient size and/or use of iterative reconstruction technique. COMPARISON:  Radiographs dated February 16, 2016. FINDINGS: Segmentation: 5 lumbar type vertebrae. Alignment: Mild levoscoliosis centered at L3 vertebral body. Straightening of the lumbar spine. Vertebrae: Compression deformity of L3 vertebral body with approximately 60% central vertebral body height loss. No evidence of retropulsion into the spinal canal. Paraspinal and other soft tissues: Rim calcified gallstone. Advanced atherosclerotic disease of abdominal aorta which is normal in caliber. No acute abnormality. No paraspinal soft tissue hematoma or fluid collection. Disc levels: Advanced multilevel degenerative disease with disc height loss and vacuum disc phenomena prominent at L3-L4 L4-L5 and L5-S1. T12-L1: Significant disc bulge, spinal canal or neural foraminal stenosis. L1-L2: Disc osteophyte complex with mild spinal canal  stenosis. No significant neural foraminal stenosis. L2-L3: Disc osteophyte complex with disc bulge and spinal canal stenosis. Mild bilateral neural foraminal stenosis.  L3-L4: Broad-based disc bulge with narrowing of lateral recesses bilaterally. Mild-to-moderate right neural foraminal stenosis. Moderate facet joint arthropathy. L4-L5: Disc height loss with disc osteophyte complex. Facet joint arthropathy. Moderate spinal canal stenosis. Moderate bilateral neural foraminal stenosis. L5-S1: Broad-based disc bulge and facet joint arthropathy with moderate lateral recess stenosis. Moderate bilateral neural foraminal stenosis. IMPRESSION: 1. Compression deformity of L3 vertebral body with approximately 60% central vertebral body height loss. No evidence of retropulsion into the spinal canal. This is new since prior radiograph, and may represent acute or chronic process. Correlate with localized pain. 2. Advanced multilevel degenerative disease with disc height loss and vacuum disc phenomena prominent at L3-L4, L4-L5 and L5-S1. 3. Multilevel disc osteophyte complexes with spinal canal stenosis and neural foraminal stenosis as described above. 4. Cholelithiasis. 5. Aortic atherosclerosis. Electronically Signed   By: Keane Police D.O.   On: 07/16/2022 16:27   CT HEAD WO CONTRAST (5MM)  Result Date: 07/16/2022 CLINICAL DATA:  Fall 6 to 7 days ago. EXAM: CT HEAD WITHOUT CONTRAST CT CERVICAL SPINE WITHOUT CONTRAST TECHNIQUE: Multidetector CT imaging of the head and cervical spine was performed following the standard protocol without intravenous contrast. Multiplanar CT image reconstructions of the cervical spine were also generated. RADIATION DOSE REDUCTION: This exam was performed according to the departmental dose-optimization program which includes automated exposure control, adjustment of the mA and/or kV according to patient size and/or use of iterative reconstruction technique. COMPARISON:  05/02/2021. FINDINGS: CT HEAD FINDINGS Brain: There is acute/recent hyperattenuating intracranial hemorrhage. Specifically, hyperattenuating subdural hemorrhage extends over the left cerebral  hemisphere, most prominent over the left frontal lobe where it measures 12 mm in thickness. Subdural hemorrhage also extends along the interhemispheric fissure, measuring up to 1.5 cm in thickness. Additional subdural hemorrhage extends along the right and left aspects of the tentorium. Suspect a small amount of medial left frontal lobe subarachnoid hemorrhage incontinuity with subdural hemorrhage. No parenchymal hemorrhage. Subdural hemorrhage causes mass effect, midline shift to the right of 6 mm, with partial effacement right-sided sulci and gyral flattening. No parenchymal or extra-axial masses. No evidence of an ischemic infarct. No hydrocephalus. Vascular: No hyperdense vessel or unexpected calcification. Skull: Normal. Negative for fracture or focal lesion. Sinuses/Orbits: Globes and orbits are unremarkable. Visualized sinuses are essentially clear. Other: None. CT CERVICAL SPINE FINDINGS Alignment: Normal. Skull base and vertebrae: No acute fracture. No primary bone lesion or focal pathologic process. Soft tissues and spinal canal: No prevertebral fluid or swelling. No visible canal hematoma. Disc levels: Moderate to marked loss of disc height at C5-C6 and C6-C7 with endplate spurring and disc bulging. No convincing disc herniation. Mild facet degenerative changes most evident on the left at C2-C3. Upper chest: No acute or significant findings. Other: None. IMPRESSION: HEAD CT 1. Acute/recent subdural hemorrhage extending over the left cerebral hemisphere, along the interhemispheric fissure and across the tentorium, with mass effect and midline shift to the right. Critical Value/emergent results were called by telephone at the time of interpretation on 07/16/2022 at 4:21 pm to provider Duffy Bruce , who verbally acknowledged these results. 2. No hydrocephalus.  No acute infarct. CERVICAL CT 1. No fracture or acute finding. Electronically Signed   By: Lajean Manes M.D.   On: 07/16/2022 16:22   CT  Cervical Spine Wo Contrast  Result Date: 07/16/2022 CLINICAL DATA:  Fall 6 to 7 days  ago. EXAM: CT HEAD WITHOUT CONTRAST CT CERVICAL SPINE WITHOUT CONTRAST TECHNIQUE: Multidetector CT imaging of the head and cervical spine was performed following the standard protocol without intravenous contrast. Multiplanar CT image reconstructions of the cervical spine were also generated. RADIATION DOSE REDUCTION: This exam was performed according to the departmental dose-optimization program which includes automated exposure control, adjustment of the mA and/or kV according to patient size and/or use of iterative reconstruction technique. COMPARISON:  05/02/2021. FINDINGS: CT HEAD FINDINGS Brain: There is acute/recent hyperattenuating intracranial hemorrhage. Specifically, hyperattenuating subdural hemorrhage extends over the left cerebral hemisphere, most prominent over the left frontal lobe where it measures 12 mm in thickness. Subdural hemorrhage also extends along the interhemispheric fissure, measuring up to 1.5 cm in thickness. Additional subdural hemorrhage extends along the right and left aspects of the tentorium. Suspect a small amount of medial left frontal lobe subarachnoid hemorrhage incontinuity with subdural hemorrhage. No parenchymal hemorrhage. Subdural hemorrhage causes mass effect, midline shift to the right of 6 mm, with partial effacement right-sided sulci and gyral flattening. No parenchymal or extra-axial masses. No evidence of an ischemic infarct. No hydrocephalus. Vascular: No hyperdense vessel or unexpected calcification. Skull: Normal. Negative for fracture or focal lesion. Sinuses/Orbits: Globes and orbits are unremarkable. Visualized sinuses are essentially clear. Other: None. CT CERVICAL SPINE FINDINGS Alignment: Normal. Skull base and vertebrae: No acute fracture. No primary bone lesion or focal pathologic process. Soft tissues and spinal canal: No prevertebral fluid or swelling. No visible  canal hematoma. Disc levels: Moderate to marked loss of disc height at C5-C6 and C6-C7 with endplate spurring and disc bulging. No convincing disc herniation. Mild facet degenerative changes most evident on the left at C2-C3. Upper chest: No acute or significant findings. Other: None. IMPRESSION: HEAD CT 1. Acute/recent subdural hemorrhage extending over the left cerebral hemisphere, along the interhemispheric fissure and across the tentorium, with mass effect and midline shift to the right. Critical Value/emergent results were called by telephone at the time of interpretation on 07/16/2022 at 4:21 pm to provider Duffy Bruce , who verbally acknowledged these results. 2. No hydrocephalus.  No acute infarct. CERVICAL CT 1. No fracture or acute finding. Electronically Signed   By: Lajean Manes M.D.   On: 07/16/2022 16:22    Microbiology: Results for orders placed or performed during the hospital encounter of 07/16/22  SARS Coronavirus 2 by RT PCR (hospital order, performed in [facility] hospital lab) *cepheid single result test* Urine, Clean Catch     Status: None   Collection Time: 07/16/22  6:04 PM   Specimen: Urine, Clean Catch; Nasal Swab  Result Value Ref Range Status   SARS Coronavirus 2 by RT PCR NEGATIVE NEGATIVE Final    Comment: (NOTE) SARS-CoV-2 target nucleic acids are NOT DETECTED.  The SARS-CoV-2 RNA is generally detectable in upper and lower respiratory specimens during the acute phase of infection. The lowest concentration of SARS-CoV-2 viral copies this assay can detect is 250 copies / mL. A negative result does not preclude SARS-CoV-2 infection and should not be used as the sole basis for treatment or other patient management decisions.  A negative result may occur with improper specimen collection / handling, submission of specimen other than nasopharyngeal swab, presence of viral mutation(s) within the areas targeted by this assay, and inadequate number of viral  copies (<250 copies / mL). A negative result must be combined with clinical observations, patient history, and epidemiological information.  Fact Sheet for Patients:   https://www.patel.info/  Fact Sheet  for Healthcare Providers: https://hall.com/  This test is not yet approved or  cleared by the [place] and has been authorized for detection and/or diagnosis of SARS-CoV-2 by FDA under an Emergency Use Authorization (EUA).  This EUA will remain in effect (meaning this test can be used) for the duration of the COVID-19 declaration under Section 564(b)(1) of the Act, 21 U.S.C. section 360bbb-3(b)(1), unless the authorization is terminated or revoked sooner.  Performed at [facility], [address] [place], [place] [address]     Labs: CBC: Recent Labs  Lab 07/16/22 1523 07/17/22 0547  WBC 10.3 9.1  NEUTROABS 8.8* 6.1  HGB 9.2* 8.8*  HCT 27.5* 26.6*  MCV 99.3 99.3  PLT 187 130   Basic Metabolic Panel: Recent Labs  Lab 07/16/22 1523 07/17/22 0547  NA 136 139  K 4.7 4.0  CL 107 109  CO2 24 24  GLUCOSE 143* 108*  BUN 19 20  CREATININE 0.77 0.83  CALCIUM 8.6* 8.6*   Liver Function Tests: No results for input(s): "AST", "ALT", "ALKPHOS", "BILITOT", "PROT", "ALBUMIN" in the last 168 hours. CBG: Recent Labs  Lab 07/17/22 1027  GLUCAP 101*    Discharge time spent: greater than 30 minutes.  Signed: Ezekiel Slocumb, DO [facility] 07/19/2022

## 2022-07-19 NOTE — Care Management Important Message (Signed)
Important Message  Patient Details  Name: Mckenzie Gutierrez MRN: [id number] Date of Birth: 03-Mar-1922   Medicare Important Message Given:  N/A - LOS <3 / Initial given by admissions     Dannette Barbara 07/19/2022, 9:36 AM

## 2022-07-19 NOTE — TOC Transition Note (Signed)
Transition of Care [facility]) - CM/SW Discharge Note   Patient Details  Name: Mckenzie Gutierrez MRN: [id number] Date of Birth: [birth year]  [facility] CM/SW Contact:  Tiburcio Bash, LCSW Phone Number: 07/19/2022, 3:40 PM   Clinical Narrative:     Patient to discharge to [facility] today, EMS has been called. No further dc needs identified.   Final next level of care: [place] Barriers to Discharge: No Barriers Identified   Patient Goals and CMS Choice Patient states their goals for this hospitalization and ongoing recovery are:: to go home      Discharge Placement                       Discharge Plan and Services                                     Social Determinants of Health (SDOH) Interventions     Readmission Risk Interventions     No data to display

## 2022-07-19 NOTE — Plan of Care (Signed)
  Problem: Pain Managment: Goal: General experience of comfort will improve Outcome: Adequate for Discharge   Problem: Pain Management: Goal: Satisfaction with pain management regimen will improve Outcome: Adequate for Discharge   Problem: Education: Goal: Knowledge of General Education information will improve Description: Including pain rating scale, medication(s)/side effects and non-pharmacologic comfort measures Outcome: Adequate for Discharge

## 2022-07-19 NOTE — Consult Note (Signed)
Consultation Note Date: 07/19/2022 at 1015  Patient Name: Mckenzie Gutierrez  DOB: 22-Sep-1921  MRN: [id number]  Age / Sex: 88 y.o., female  PCP: Jon Billings, NP Referring Physician: Ezekiel Slocumb, DO  Reason for Consultation: Establishing goals of care  HPI/Patient Profile: 88 y.o. female  with past medical history of paroxysmal A-fib (Eliquis), CVA, HTN, HLD, PVD, hypothyroidism, and recent UTI (treated by PCP with Bactrim on 11/17) admitted on 07/16/2022 with back pain related to a fall at home.  CT of head revealed left subdural hematoma with mass effect and midline shift to the right (6 mm) these.  Radiographs also revealed a L3 vertebral compression fracture.  Neurosurgery was consulted and has advised family to consider palliative/hospice care as no intervention is currently being considered and patient has not made substantial improvement.  PMT was consulted to discuss goals of care.  Clinical Assessment and Goals of Care: I have reviewed medical records including EPIC notes, labs and imaging, assessed the patient and then met with patient, her son Herbie Baltimore, and daughter [name] at bedside to discuss diagnosis prognosis, [place], EOL wishes, disposition and options.   Pt is asleep during the entirety of our discussion. She does not awaken to her name or gentle touch but is resting in NAD with respirations even and unlabored.   I introduced Palliative Medicine as specialized medical care for people living with serious illness. It focuses on providing relief from the symptoms and stress of a serious illness. The goal is to improve quality of life for both the patient and the family.  We discussed a brief life review of the patient.  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  She was married for 70+ years to her husband who passed in 2016.  As far as functional and  nutritional status patient was living at her home independently.  Family would frequently check on her but family endorses she was independent with all ADLs.  We discussed patient's current illness and what it means in the larger context of patient's on-going co-morbidities. I attempted to elicit values and goals of care important to the patient. Patient's son says he and his family understand from neurosurgery there is nothing to be done to improve her medical situation given patient did not want any aggressive measures or interventions. He says patient stated earlier this week in ED when she was lucid that she would not want artifical hydration/nutrition or surgeries as well.       Family recalls that her father lives for a year and a half at a nursing facility after a stroke.  They shared that her mother is with him every day, saw him linger, and shares that she would not want this for herself.  The difference between aggressive medical intervention and comfort care was considered in light of the patient's goals of care. Family asked what options they have as far as hospice. Hospice philosophy and hospice plan of care briefly discussed. Family shares they are unable to  take care of her in anyone's home and would like for her to be evaluated for inpatient status.   Family also in agreement with comfort care measures. They say they don't want to hurt their mother or do anything to make her suffer.   Comfort orders for this patient include:  -VS once a day at 1200 and at family's request -Discontinue PT/OT/interventions not focused on comfort -Unrestricted visitor access -Adjust medications to be given IV or only when patient is alert/safely able to take POs - I have not removed medications entirely as family reports patient was able to drink ensure yesterday.  Education offered regarding concept specific to human mortality and the limitations of medical interventions to prolong life when the body  begins to fail to thrive.  Therapeutic silence, active listening, and emotional support provided. lf.  Attending, charge RN, RN, and TOC made aware of family's wishes for hospice evaluation.   Questions and concerns were addressed. The family was encouraged to call with questions or concerns. PMT will continue to follow the patient throughout her hospitalization.   Hard Choices and Gone from my Sight books left at bedside for family to review.   Primary Decision Maker HCPOA  Physical Exam Vitals reviewed.  Constitutional:      General: She is not in acute distress.    Appearance: Normal appearance. She is not ill-appearing.  HENT:     Head: Normocephalic.  Cardiovascular:     Rate and Rhythm: Normal rate.     Pulses: Normal pulses.  Pulmonary:     Effort: Pulmonary effort is normal.  Abdominal:     Palpations: Abdomen is soft.  Musculoskeletal:     Comments: Generalized weakness  Skin:    General: Skin is warm and dry.     Palliative Assessment/Data:     Thank you for this consult. Palliative medicine will continue to follow and assist holistically.   Time Total: 75 minutes Greater than 50%  of this time was spent counseling and coordinating care related to the above assessment and plan.  Signed by: Jordan Hawks, DNP, FNP-BC Palliative Medicine    Please contact Palliative Medicine Team phone at [phone] for questions and concerns.  For individual provider: See Shea Evans

## 2022-08-04 DEATH — deceased

## 2022-09-28 ENCOUNTER — Ambulatory Visit: Payer: PPO | Admitting: Nurse Practitioner

## 2023-03-13 ENCOUNTER — Ambulatory Visit (INDEPENDENT_AMBULATORY_CARE_PROVIDER_SITE_OTHER): Payer: PPO | Admitting: Vascular Surgery

## 2023-03-13 ENCOUNTER — Encounter (INDEPENDENT_AMBULATORY_CARE_PROVIDER_SITE_OTHER): Payer: PPO

## 2023-04-24 ENCOUNTER — Other Ambulatory Visit: Payer: PPO

## 2023-04-26 ENCOUNTER — Ambulatory Visit: Payer: PPO | Admitting: Oncology
